# Patient Record
Sex: Male | Born: 1970
Health system: Southern US, Community
[De-identification: ages and names within clinical notes are randomized; demographics above are authoritative.]

## PROBLEM LIST (undated history)

## (undated) DIAGNOSIS — K859 Acute pancreatitis without necrosis or infection, unspecified: Secondary | ICD-10-CM

## (undated) HISTORY — PX: CHOLECYSTECTOMY: SHX55

---

## 2008-05-02 ENCOUNTER — Encounter: Admission: RE | Admit: 2008-05-02 | Discharge: 2008-05-02 | Payer: Self-pay | Admitting: Family Medicine

## 2008-05-02 IMAGING — US US SCROTUM
1 series · 14 of 25 positions shown · non-contrast
Comparison: None.

CLINICAL DATA: Bilateral scrotal mass.

ULTRASOUND OF SCROTUM
TECHNIQUE: Complete ultrasound examination of the testicles,
epididymis, and other scrotal structures was performed.

[Series 1: us scrotum · 0.08mm/px · 14 of 35 slices shown]
[im 1/35]
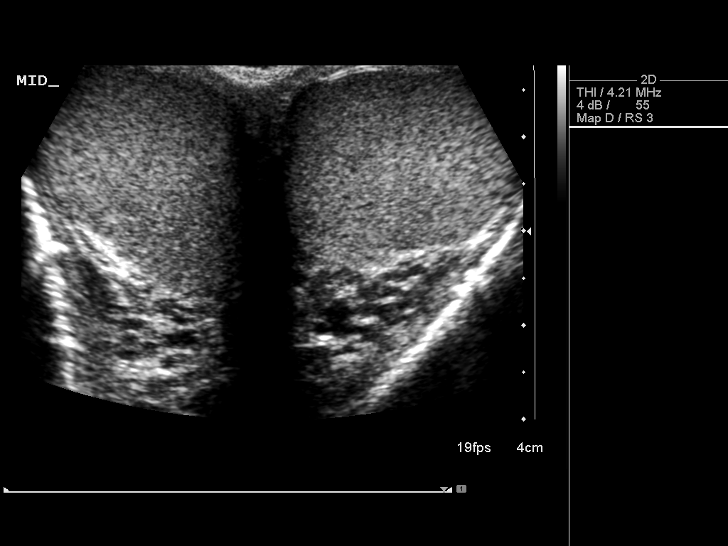
[im 3/35]
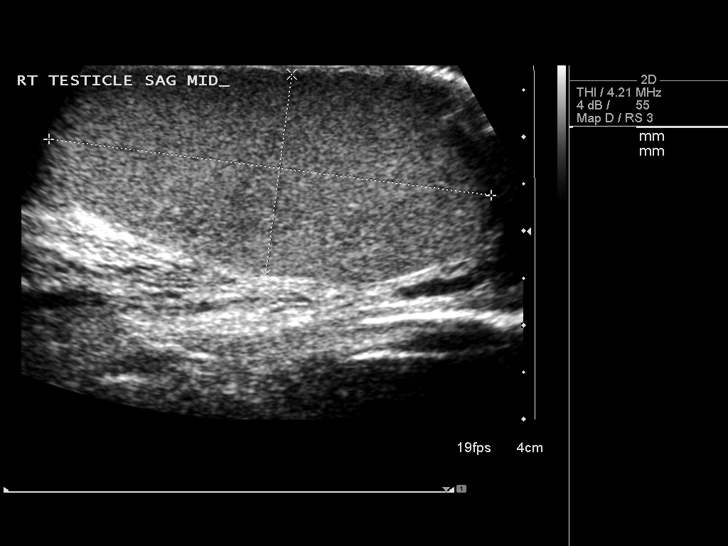
[im 6/35]
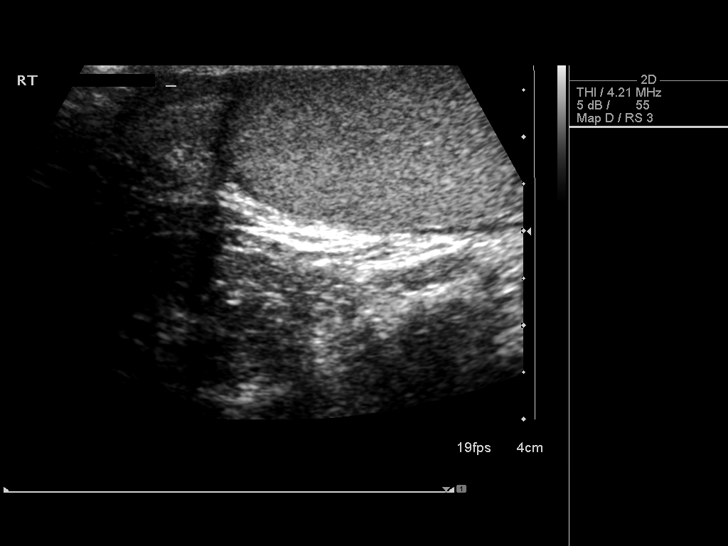
[im 9/35]
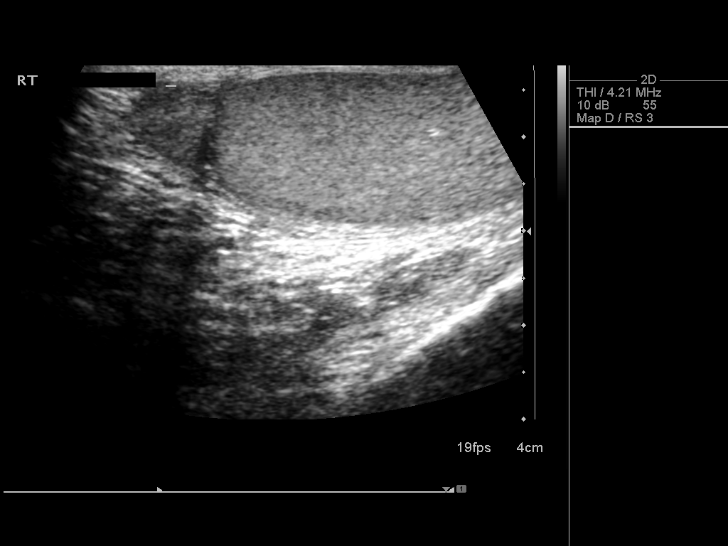
[im 12/35]
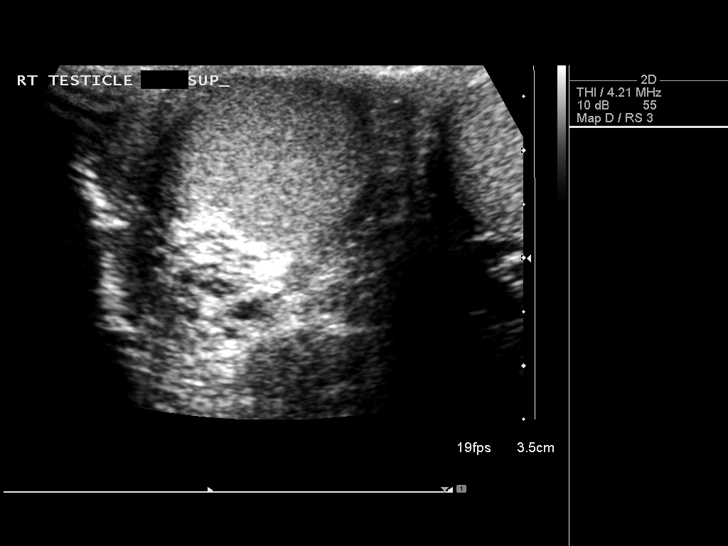
[im 13/35]
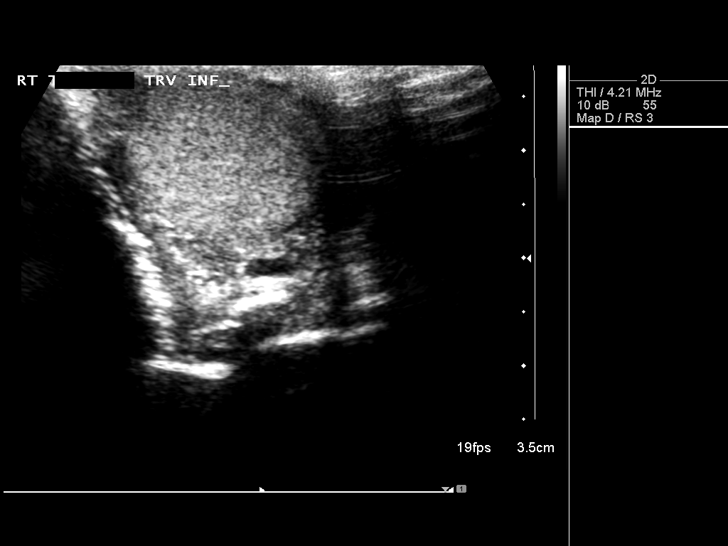
[im 16/35]
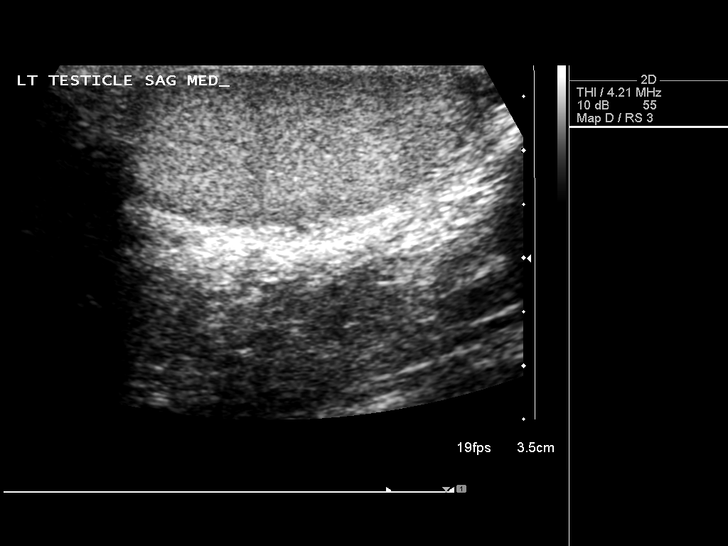
[im 19/35]
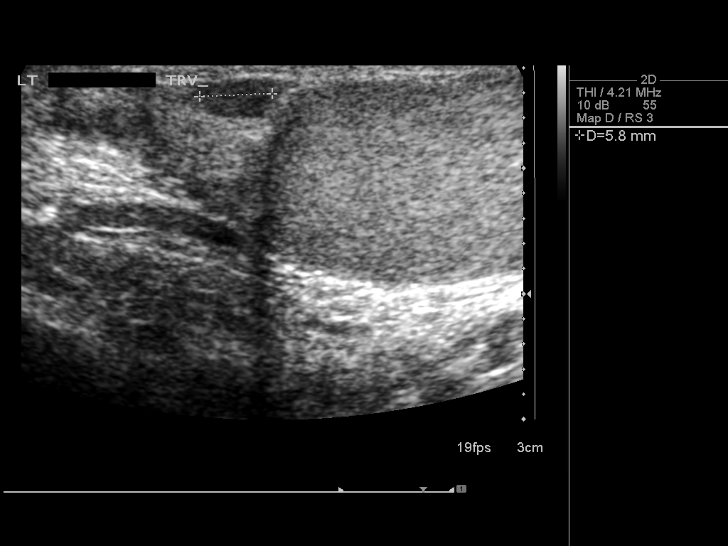
[im 22/35]
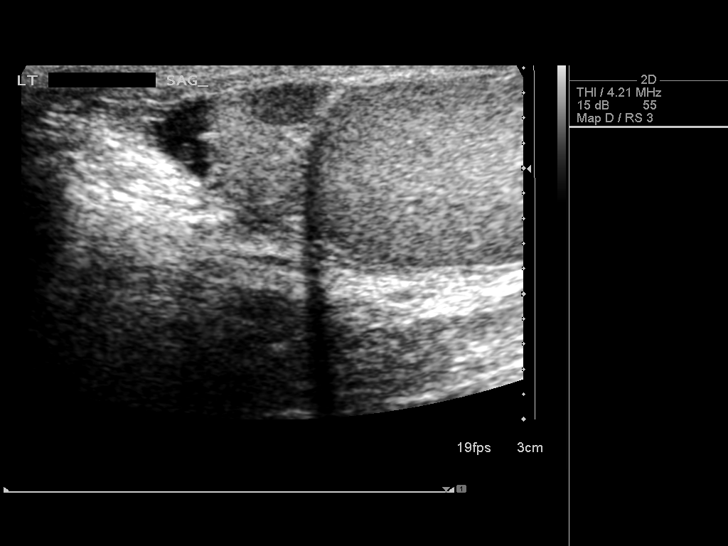
[im 23/35]
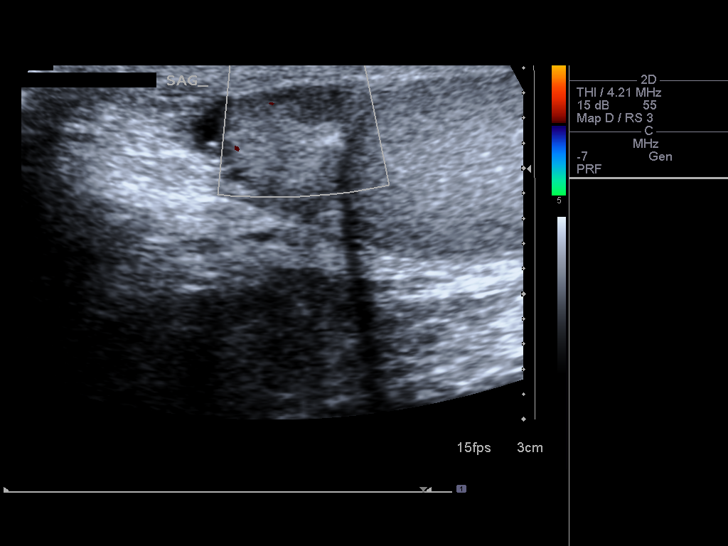
[im 26/35]
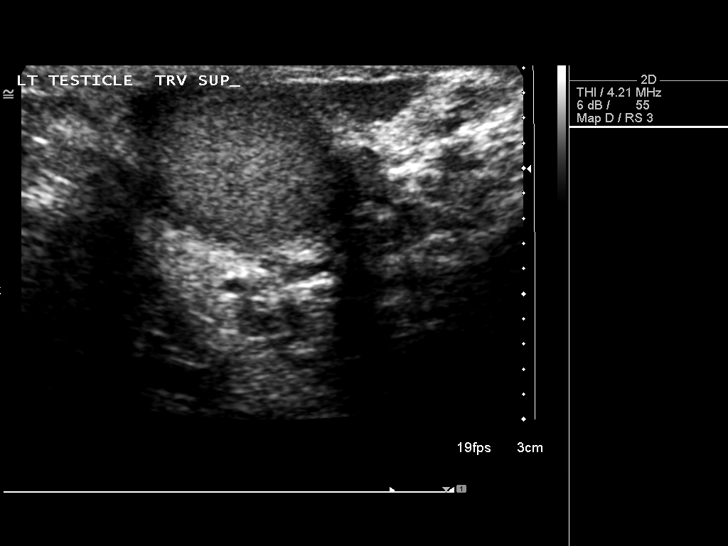
[im 29/35]
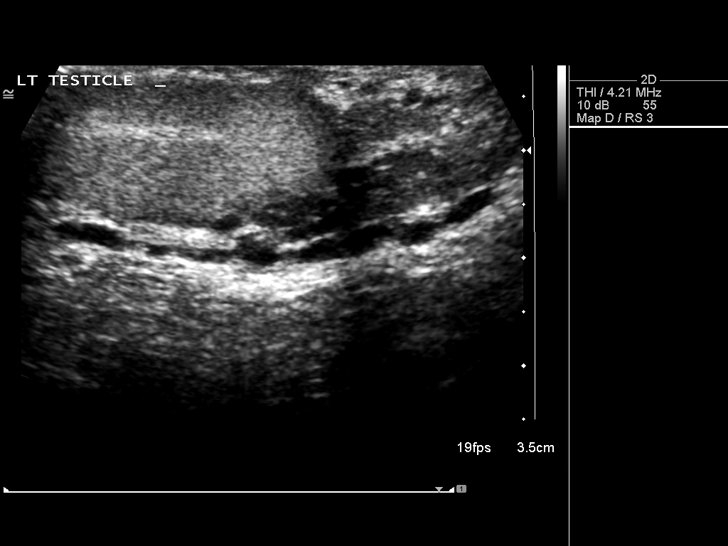
[im 32/35]
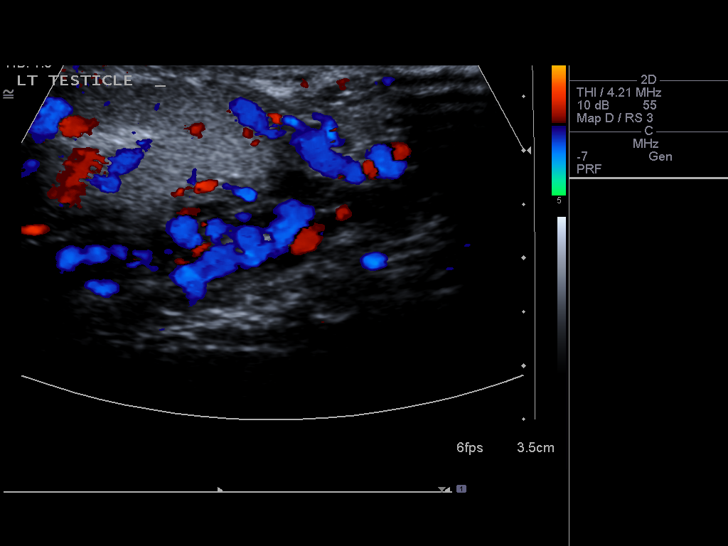
[im 35/35]
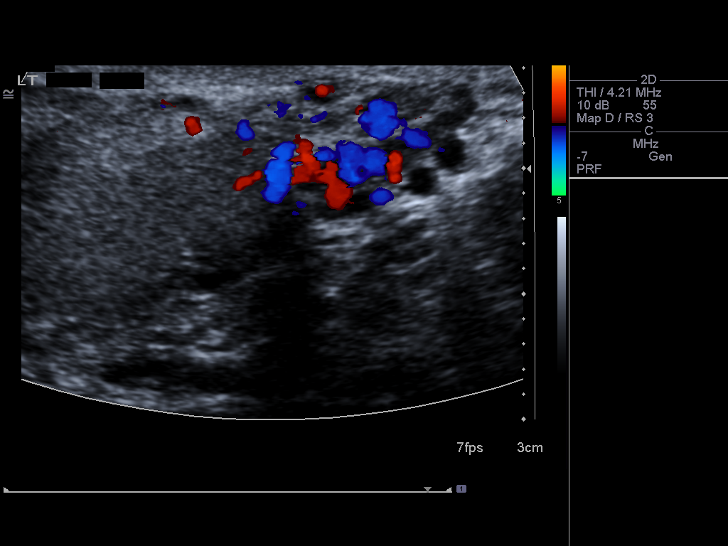

[14 of 25 positions shown; findings below may reference images not displayed]

FINDINGS: Right testicle measures 4.7 x 2.1 x 2.5 cm and left
testicle, 4.1 x 1.9 x 2.4 cm.  Color Doppler flow is seen
bilaterally and parenchymal echo texture is uniform.  Right
epididymis is unremarkable.  Hypoechoic nodule in the left
epididymis measures 5 mm and may be granulomatous in etiology.  No
hydrocele.  Left varicocele.
IMPRESSION: 1.  Left varicocele.

## 2010-01-15 ENCOUNTER — Inpatient Hospital Stay (HOSPITAL_COMMUNITY): Admission: EM | Admit: 2010-01-15 | Discharge: 2010-01-17 | Payer: Self-pay | Admitting: Emergency Medicine

## 2010-01-15 IMAGING — CR DG ABDOMEN ACUTE W/ 1V CHEST
3 series · 3 of 3 positions shown · non-contrast
Comparison: None.

CLINICAL DATA: Abdominal pain with nausea, abdominal pain with
nausea, vomiting, and diarrhea

ACUTE ABDOMEN SERIES (ABDOMEN 2 VIEW & CHEST 1 VIEW)

[view not recorded (1 of 3)]
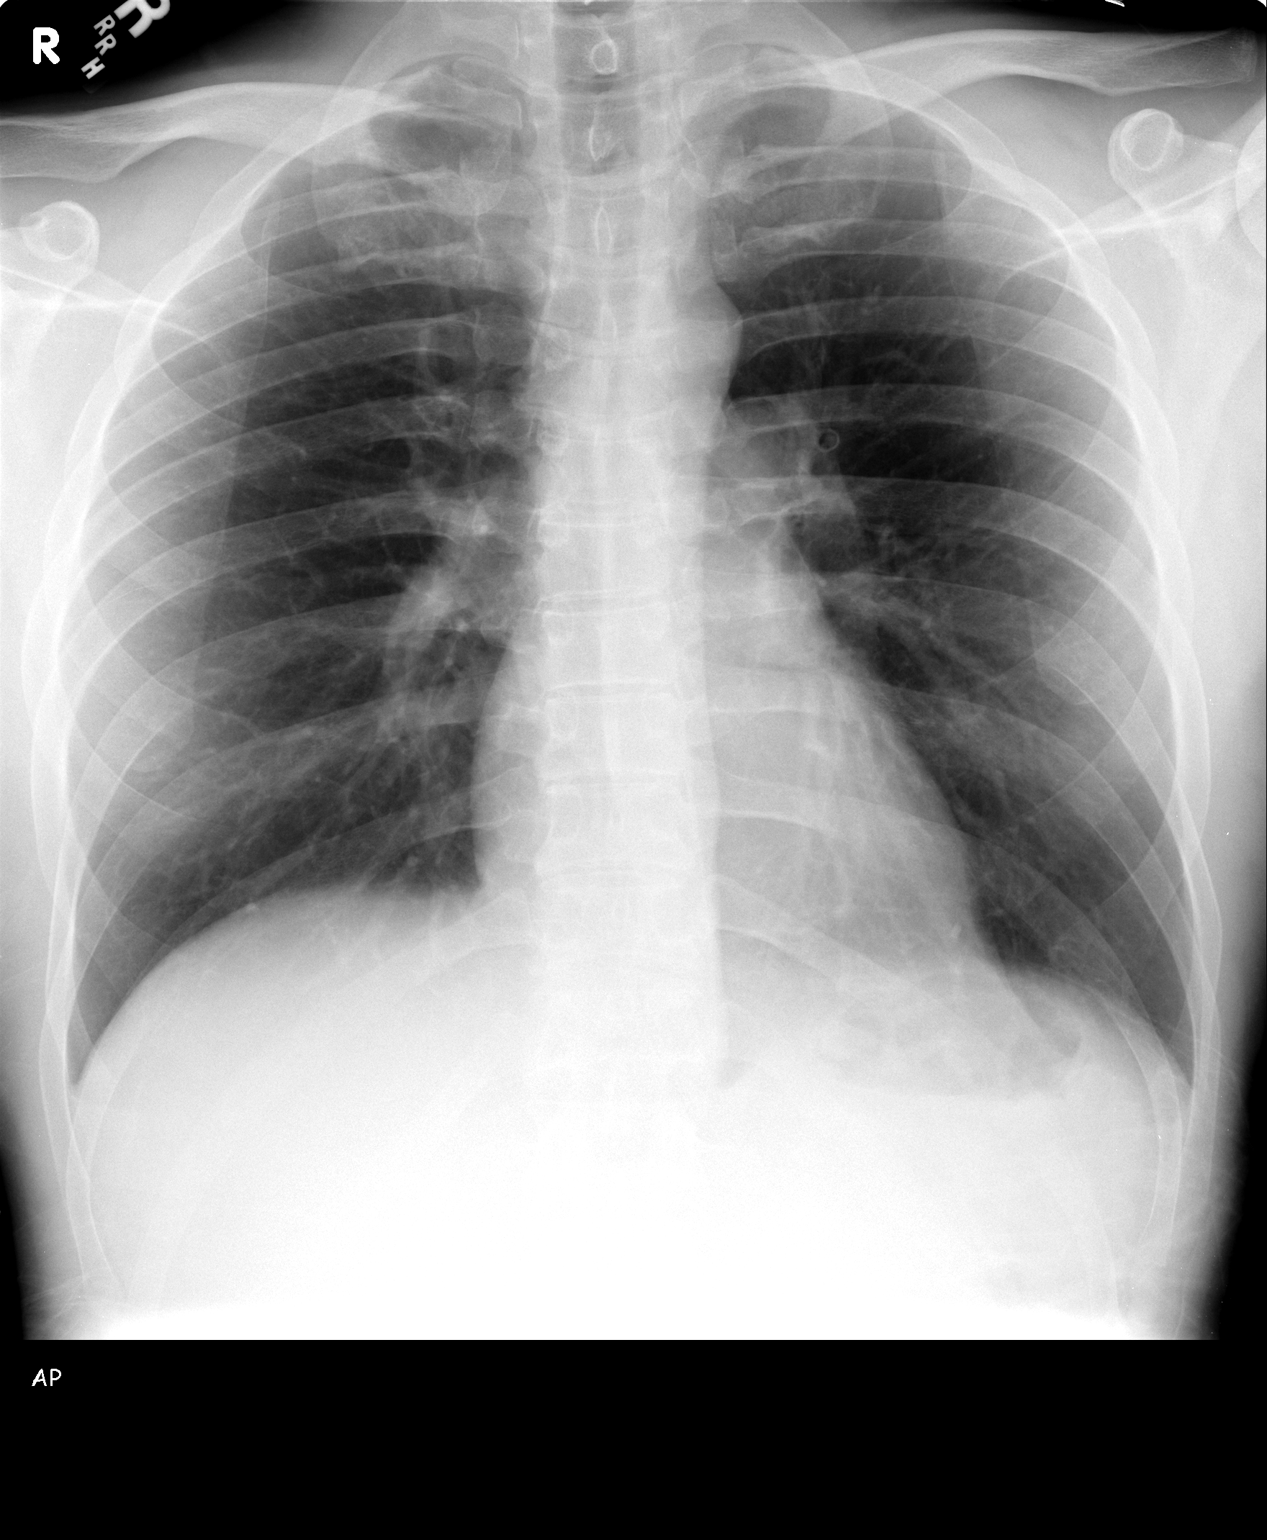

[view not recorded (2 of 3)]
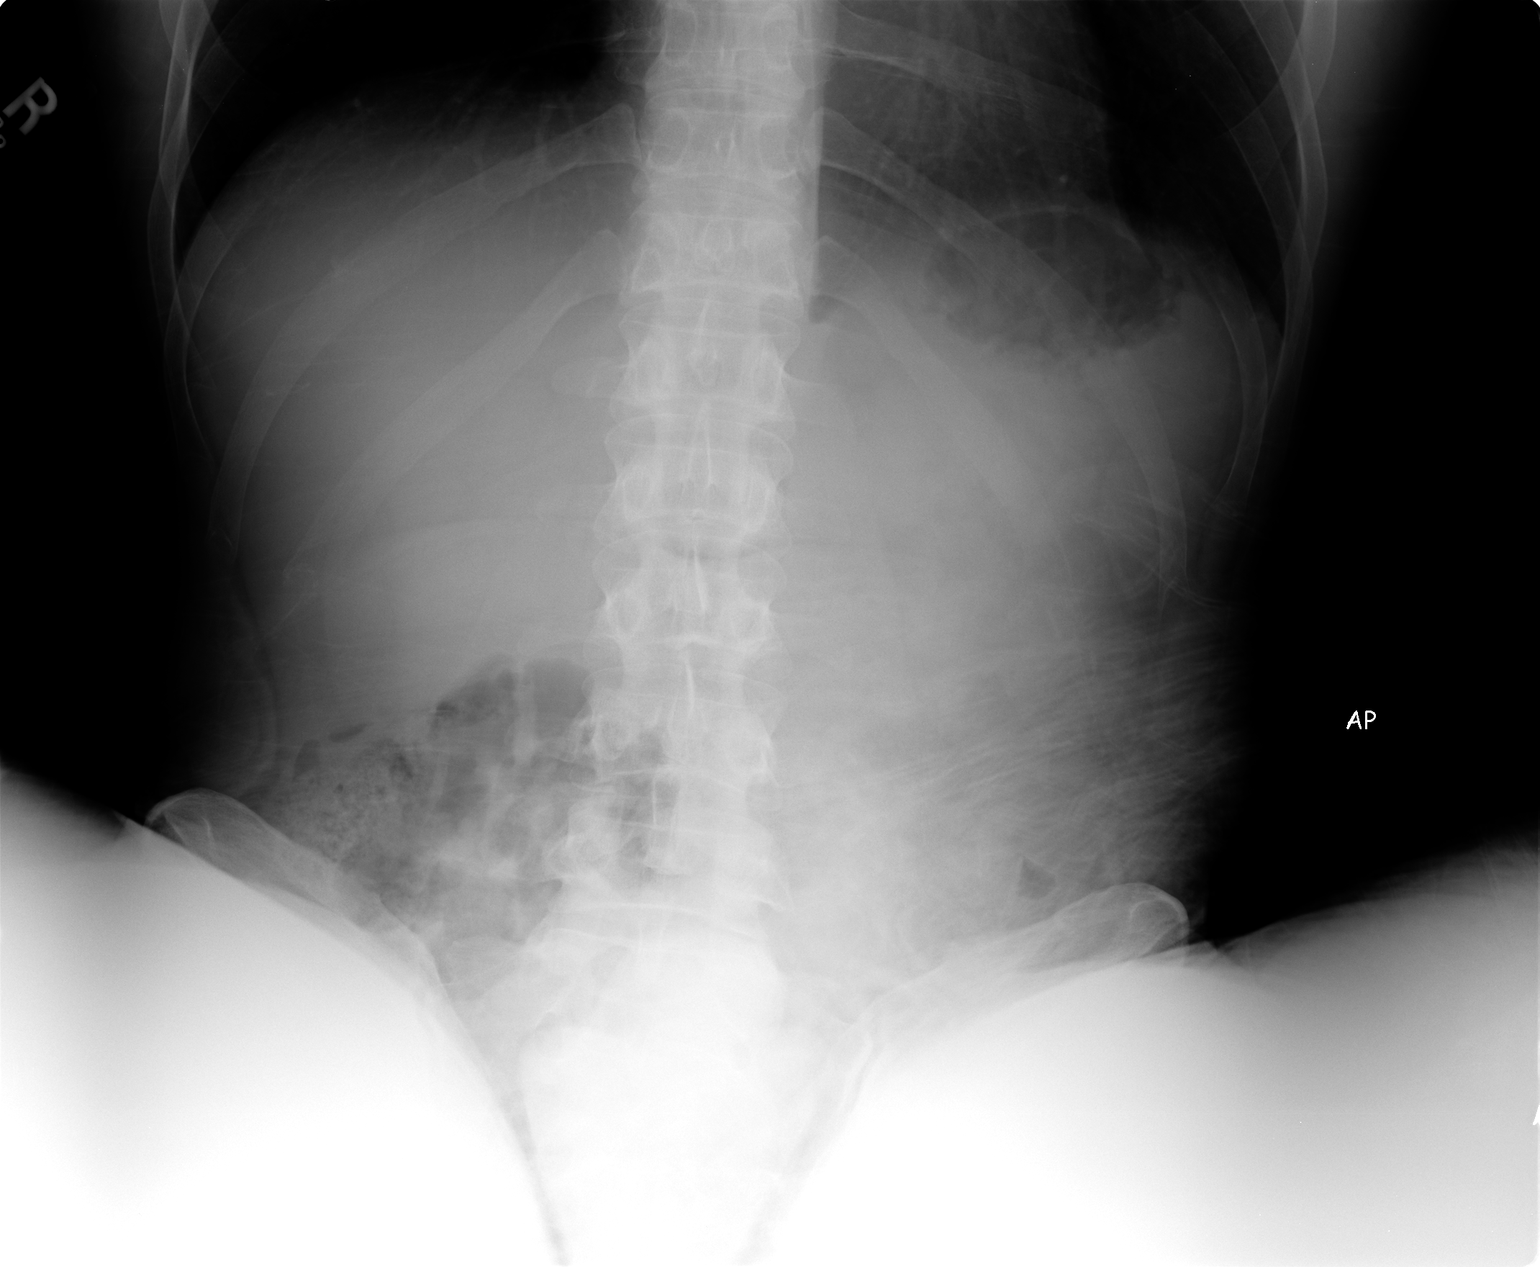

[view not recorded (3 of 3)]
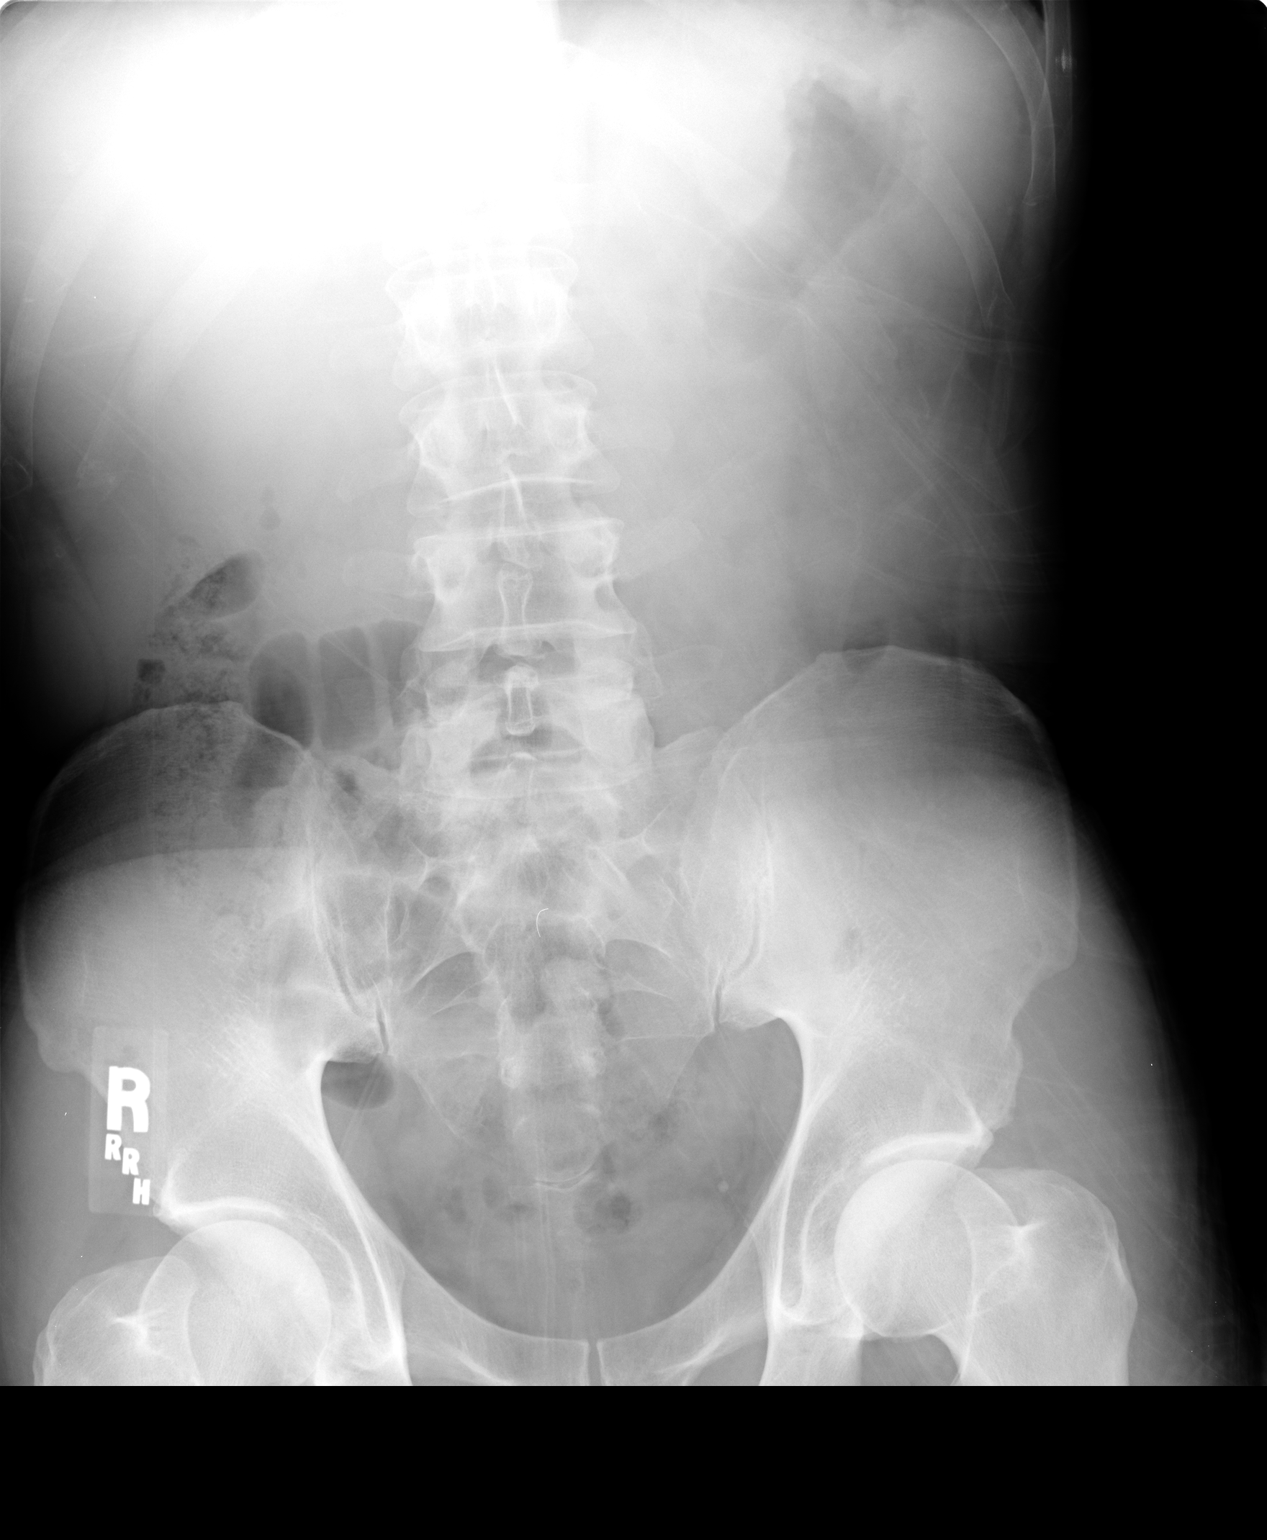

[3 of 3 positions shown; findings below may reference images not displayed]

FINDINGS: No active cardio-pulmonary disese. No free air or
acute/specific abnormality of the bowel gas pattern.  Psoas margins
intact.  No pathological calcifications.  Osseous structures
unremarkable.
IMPRESSION: No acute or specific findings.

## 2010-01-16 IMAGING — US US ABDOMEN COMPLETE
1 series · 14 of 25 positions shown · non-contrast
Comparison: None

CLINICAL DATA: Acute pancreatitis

ULTRASOUND ABDOMEN:
TECHNIQUE: Sonography of upper abdominal structures was performed.

[Series 1: us abdomen complete · 0.24mm/px · 14 of 62 slices shown]
[im 1/62]
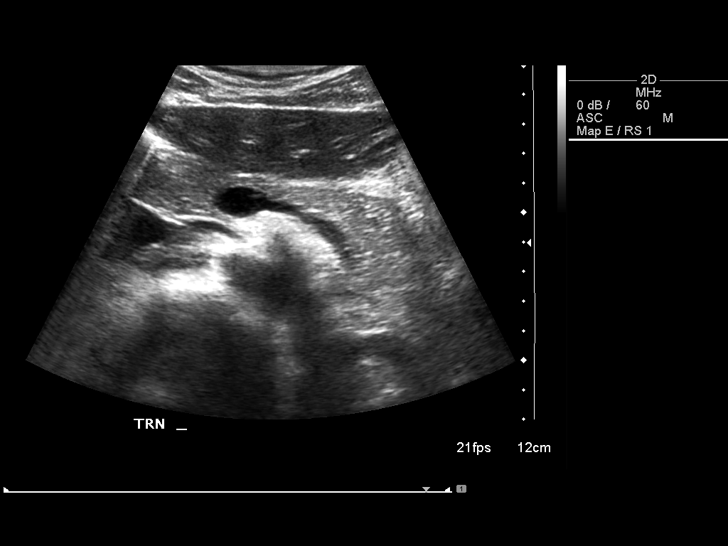
[im 6/62]
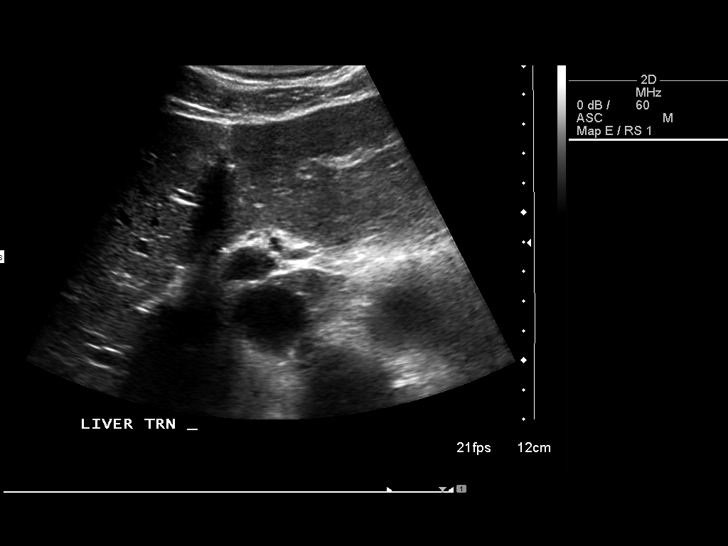
[im 11/62]
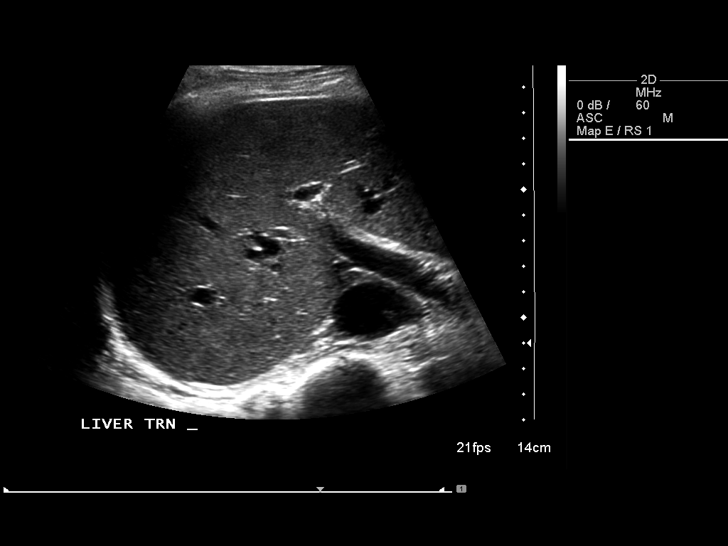
[im 16/62]
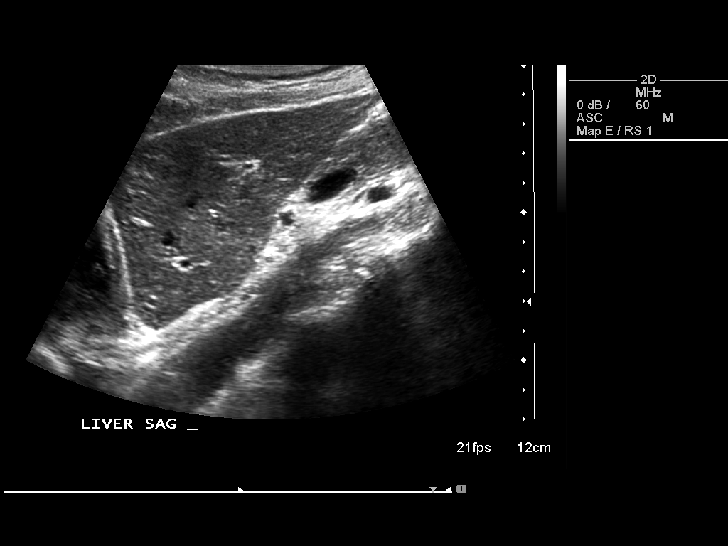
[im 21/62]
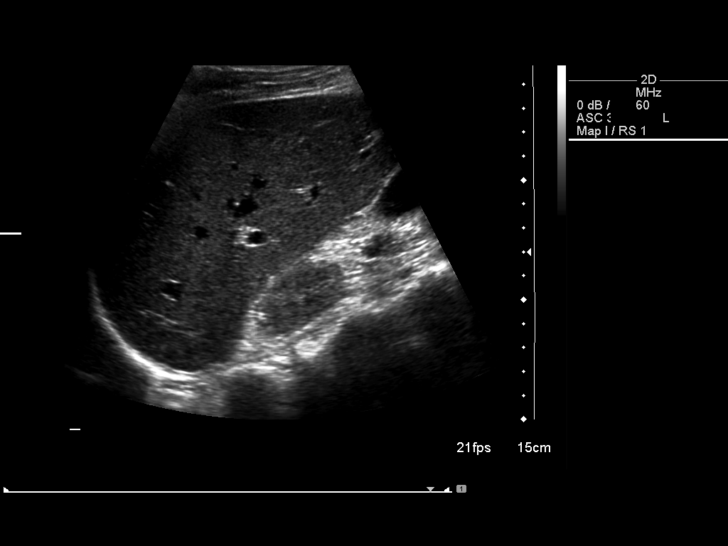
[im 23/62]
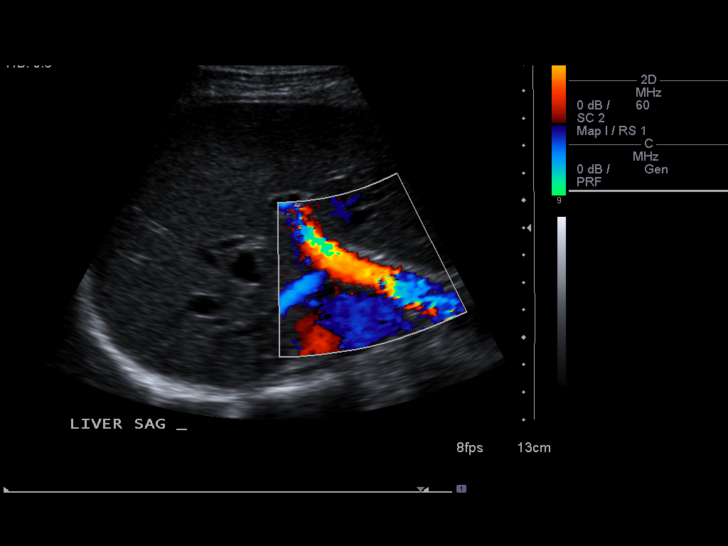
[im 28/62]
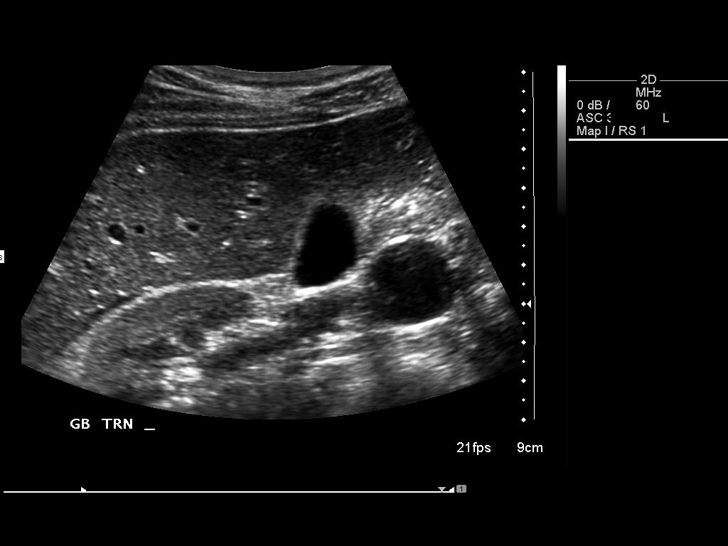
[im 34/62]
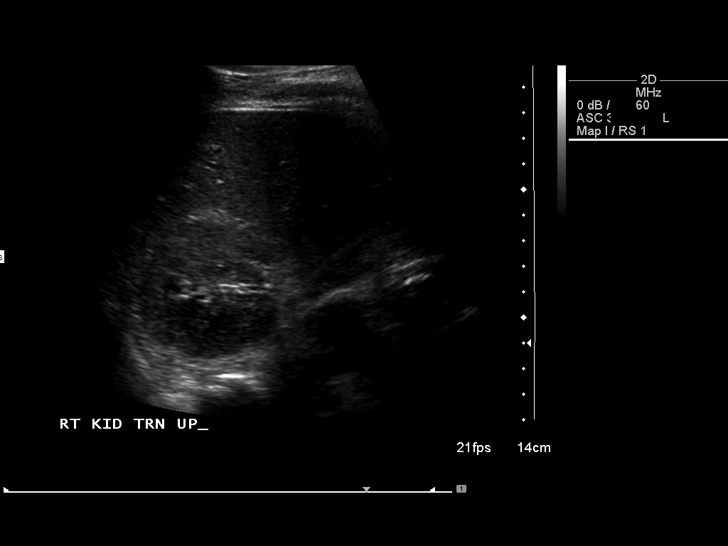
[im 39/62]
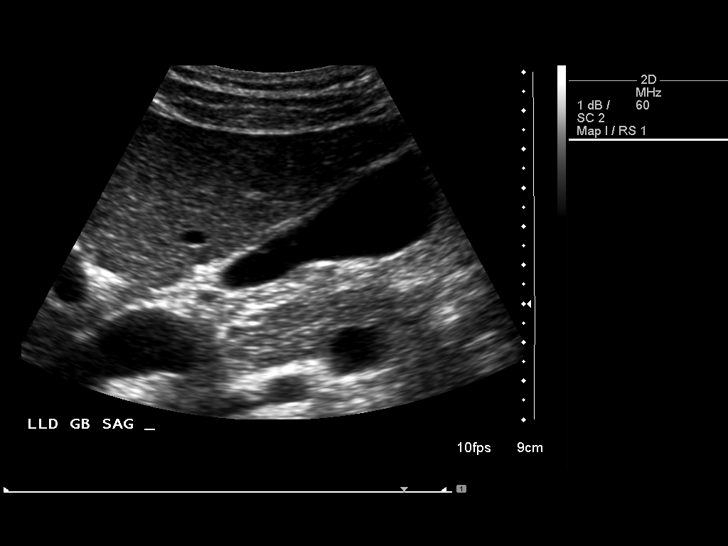
[im 41/62]
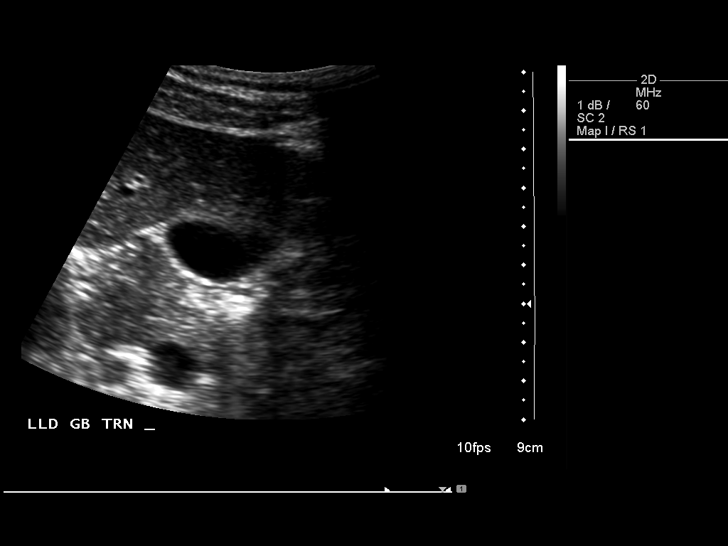
[im 46/62]
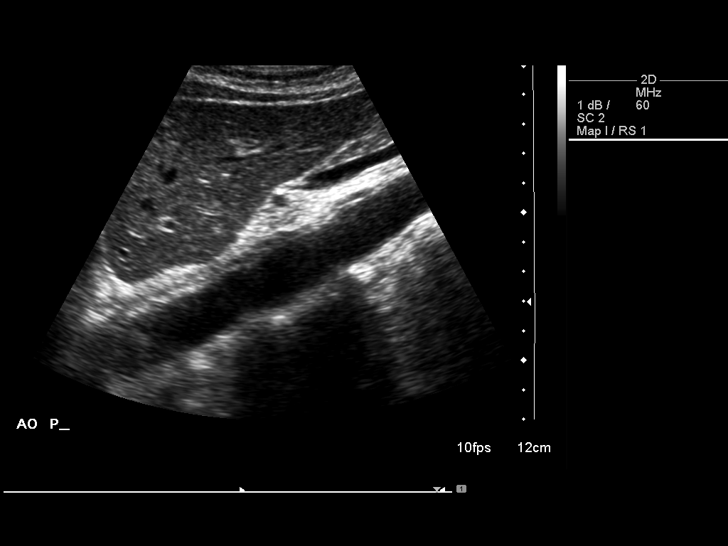
[im 51/62]
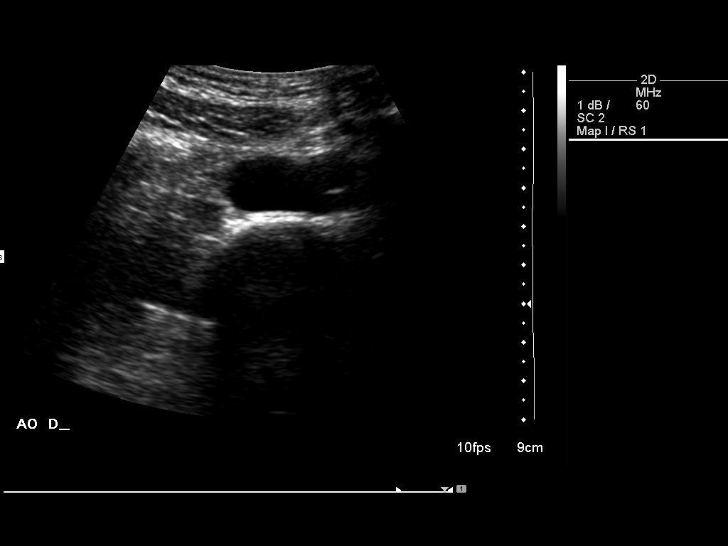
[im 56/62]
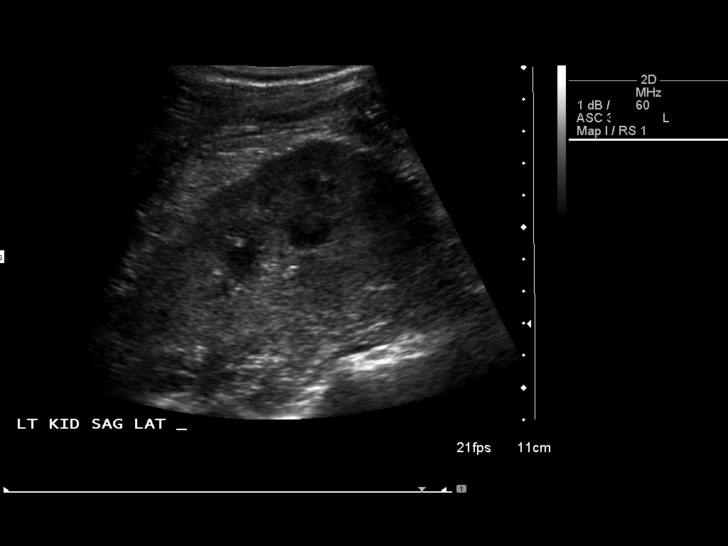
[im 62/62]
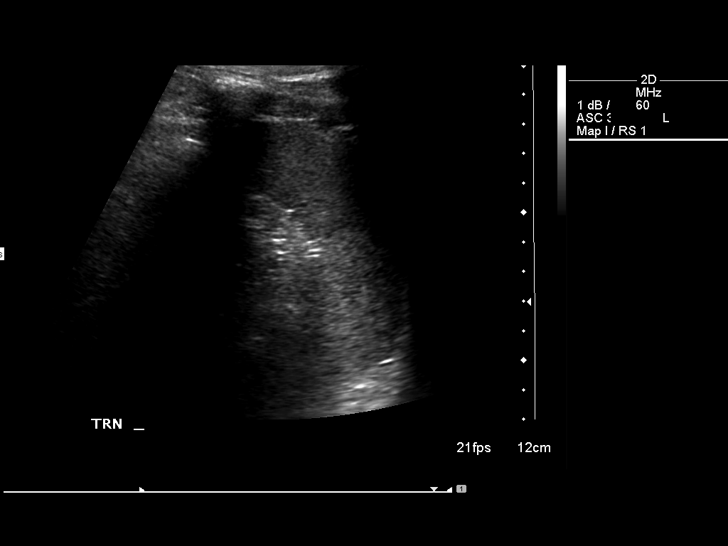

[14 of 25 positions shown; findings below may reference images not displayed]

Gallbladder:  Normally distended without stones or pericholecystic
fluid.
Gallbladder wall borderline thickened 3 mm thick.
No sonographic Murphy's sign.

Common bile duct:  3 mm diameter, normal.

Liver:  Normal appearance, patent portal vein with hepatopetal
flow.  No hepatic mass or nodularity.

IVC:  Unremarkable

Pancreas:  Sonographically normal appearance.  No ductal
dilatation, calcifications, or fluid collections.

Spleen:  Normal appearance, 10.0 cm length.

Right kidney:  11.7 cm length.  Normal morphology without mass or
hydronephrosis.

Left kidney:  11.7 cm length.  Normal morphology without mass or
hydronephrosis.

Aorta:  Unremarkable

Other:  All no free fluid
IMPRESSION: Upper normal gallbladder wall thickness 3 mm diameter without
evidence of calculi or tenderness.
Otherwise negative upper abdominal ultrasound.

## 2010-01-17 IMAGING — CR DG CHEST 2V
2 series · 2 of 2 positions shown · non-contrast
Comparison: [DATE]

CLINICAL DATA: Cough, smoker, question pneumonia, acute
pancreatitis

CHEST - 2 VIEW

[view not recorded (1 of 2)]
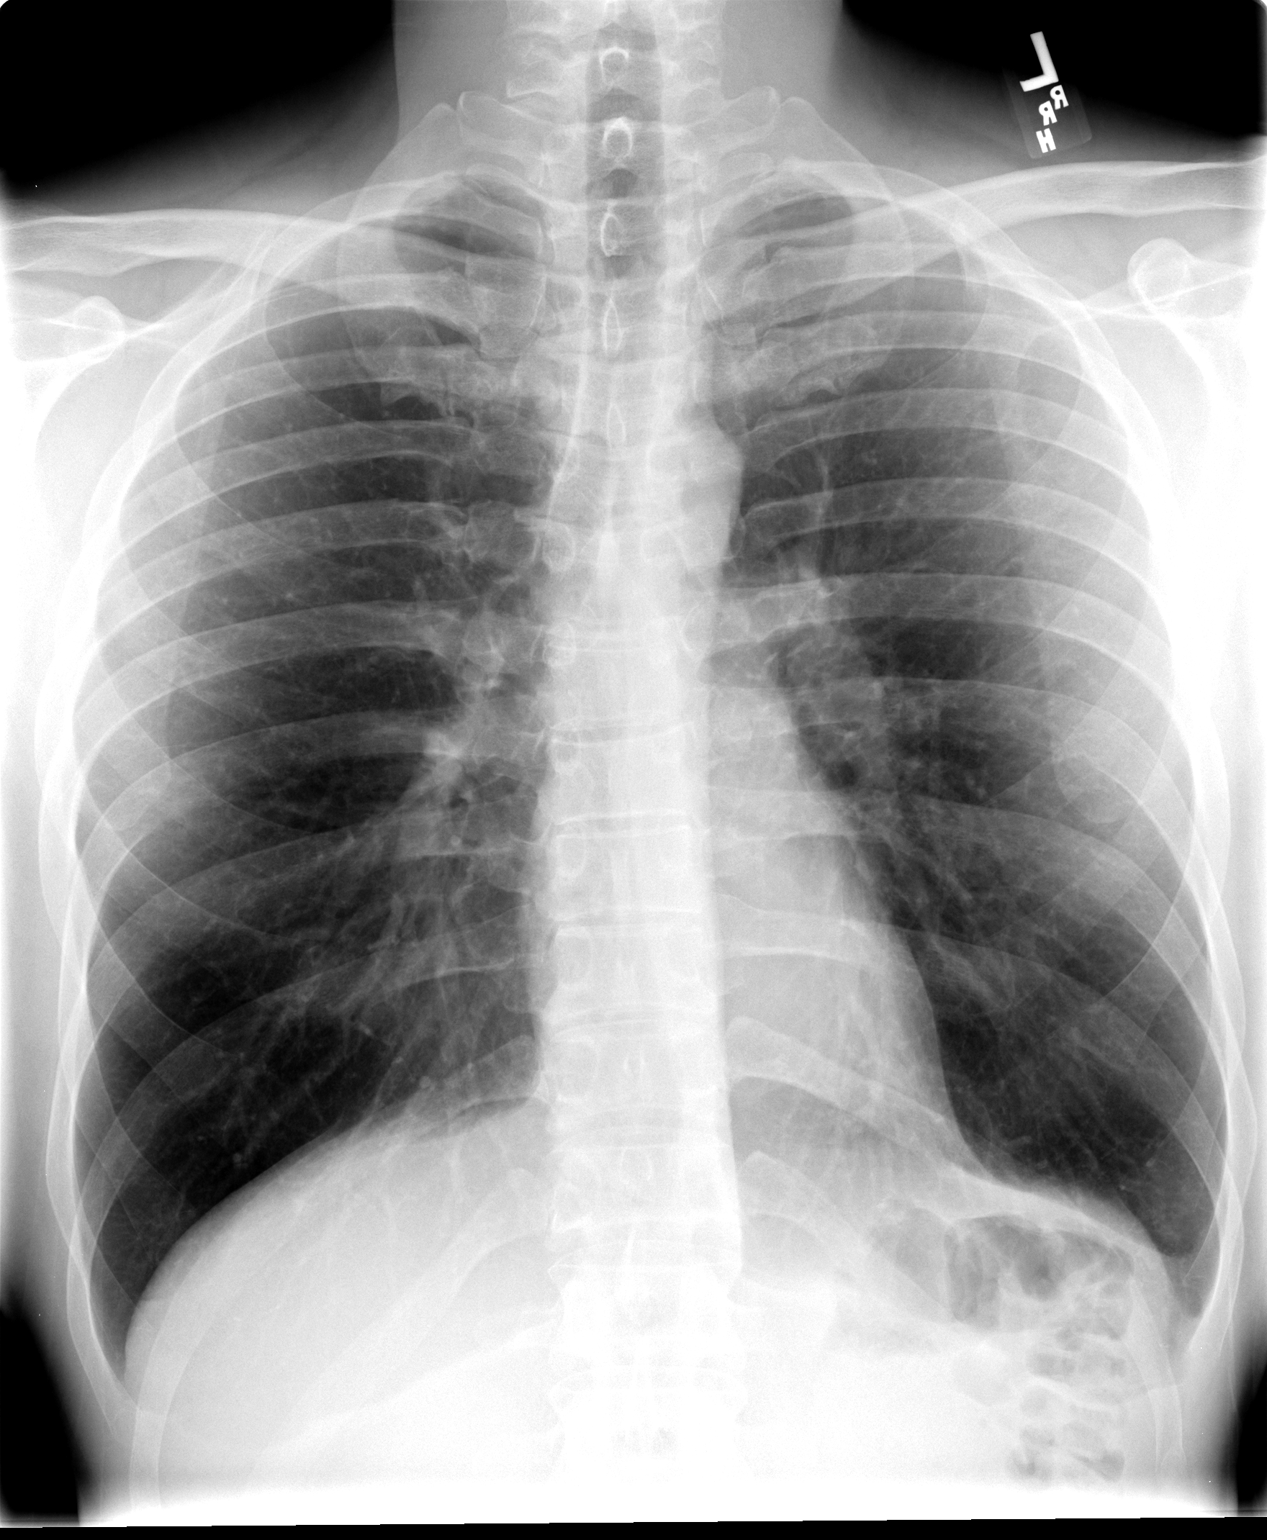

[view not recorded (2 of 2)]
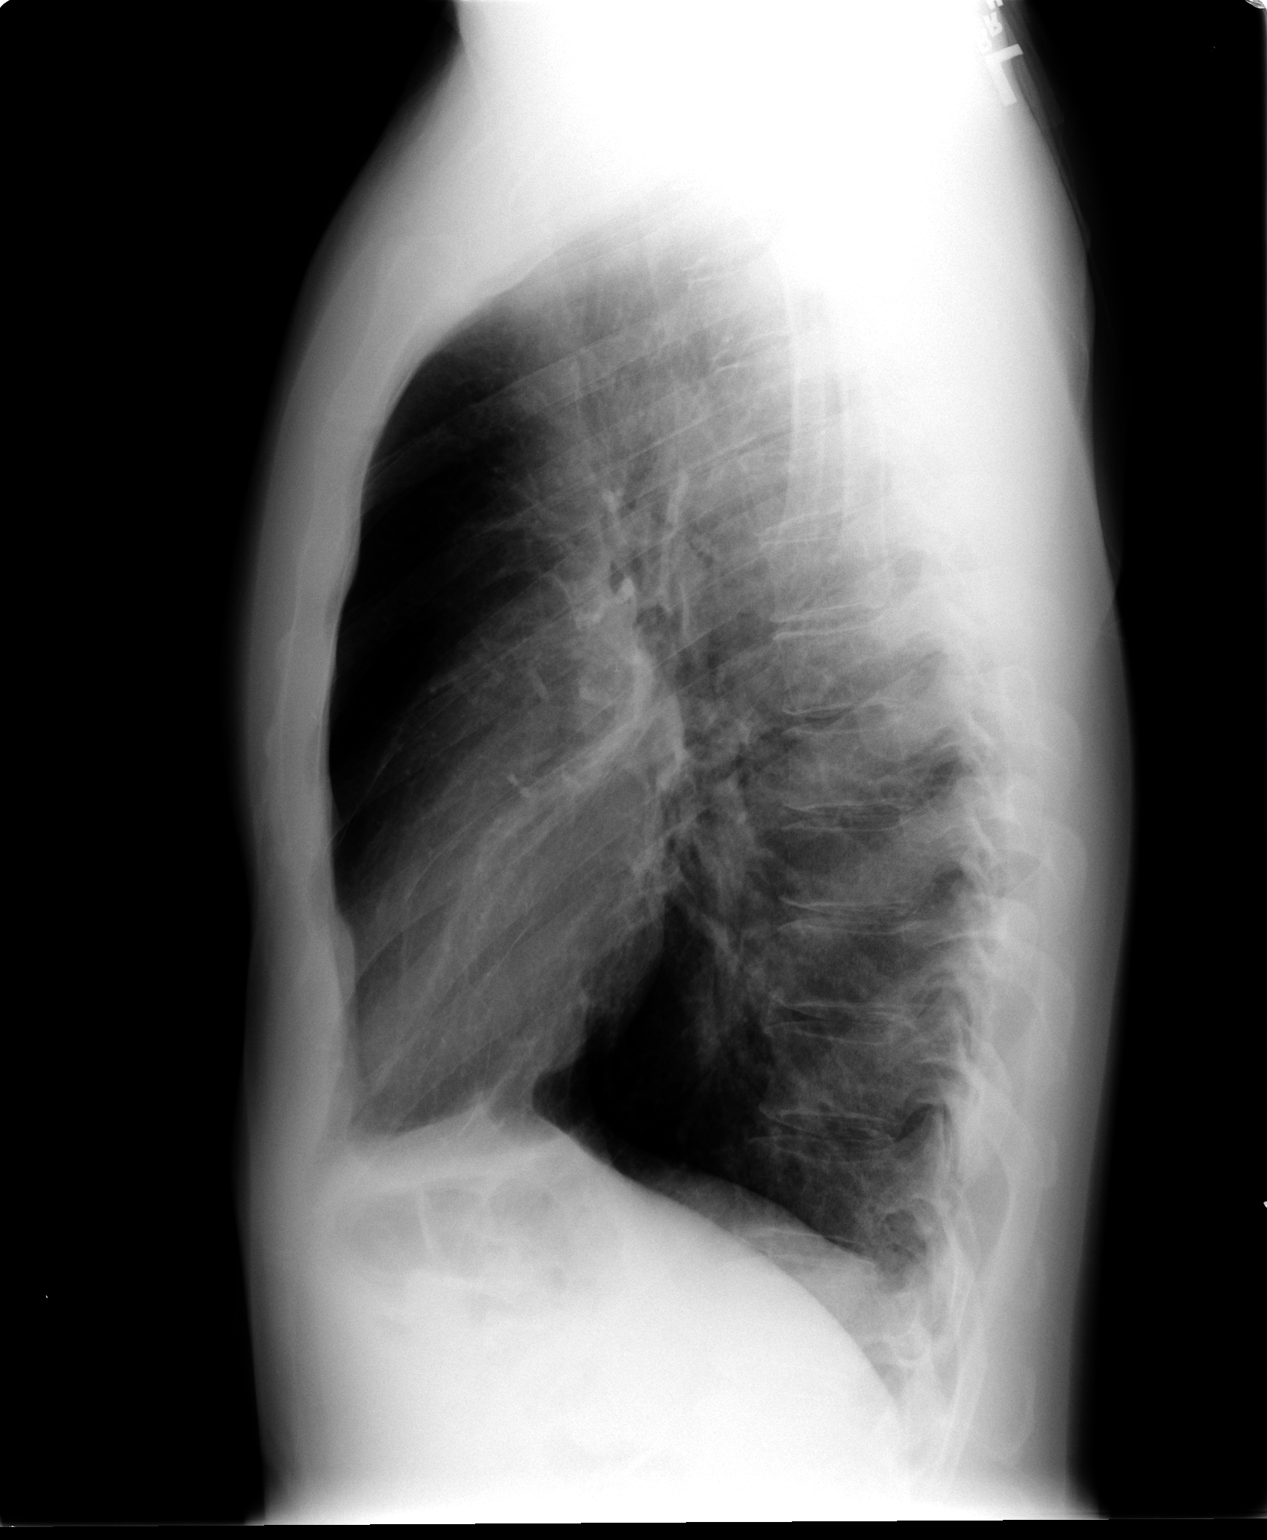

[2 of 2 positions shown; findings below may reference images not displayed]

FINDINGS: Normal heart size, mediastinal contours, and pulmonary vascularity.
Emphysematous and minimal bronchitic changes compatible with COPD.
No definite pulmonary infiltrate, pleural effusion, or
pneumothorax.
No acute bony findings.
IMPRESSION: COPD.
No acute abnormalities.

## 2011-02-04 LAB — BASIC METABOLIC PANEL
BUN: 10 mg/dL (ref 6–23)
BUN: 11 mg/dL (ref 6–23)
CO2: 23 mEq/L (ref 19–32)
CO2: 26 mEq/L (ref 19–32)
Calcium: 9.1 mg/dL (ref 8.4–10.5)
Calcium: 9.4 mg/dL (ref 8.4–10.5)
Chloride: 105 mEq/L (ref 96–112)
Chloride: 107 mEq/L (ref 96–112)
Creatinine, Ser: 0.78 mg/dL (ref 0.4–1.5)
Creatinine, Ser: 0.88 mg/dL (ref 0.4–1.5)
GFR calc Af Amer: >60 mL/min (ref >60)
GFR calc Af Amer: >60 mL/min (ref >60)
GFR calc non Af Amer: >60 mL/min (ref >60)
GFR calc non Af Amer: >60 mL/min (ref >60)
Glucose, Bld: 118 mg/dL — ABNORMAL HIGH (ref 70–99)
Glucose, Bld: 97 mg/dL (ref 70–99)
Potassium: 3.4 mEq/L — ABNORMAL LOW (ref 3.5–5.1)
Potassium: 4 mEq/L (ref 3.5–5.1)
Sodium: 137 mEq/L (ref 135–145)
Sodium: 138 mEq/L (ref 135–145)

## 2011-02-04 LAB — LIPASE, BLOOD
Lipase: 115 U/L — ABNORMAL HIGH (ref 11–59)
Lipase: 48 U/L (ref 11–59)

## 2011-02-04 LAB — DIFFERENTIAL
Basophils Absolute: 0.1 10*3/uL (ref 0.0–0.1)
Basophils Absolute: 0.1 10*3/uL (ref 0.0–0.1)
Basophils Relative: 1 % (ref 0–1)
Basophils Relative: 1 % (ref 0–1)
Eosinophils Absolute: 0 10*3/uL (ref 0.0–0.7)
Eosinophils Absolute: 0 10*3/uL (ref 0.0–0.7)
Eosinophils Relative: 0 % (ref 0–5)
Eosinophils Relative: 0 % (ref 0–5)
Lymphocytes Relative: 29 % (ref 12–46)
Lymphocytes Relative: 8 % — ABNORMAL LOW (ref 12–46)
Lymphs Abs: 0.9 10*3/uL (ref 0.7–4.0)
Lymphs Abs: 1.8 10*3/uL (ref 0.7–4.0)
Monocytes Absolute: 0.5 10*3/uL (ref 0.1–1.0)
Monocytes Absolute: 0.6 10*3/uL (ref 0.1–1.0)
Monocytes Relative: 4 % (ref 3–12)
Monocytes Relative: 9 % (ref 3–12)
Neutro Abs: 3.7 10*3/uL (ref 1.7–7.7)
Neutro Abs: 9.6 10*3/uL — ABNORMAL HIGH (ref 1.7–7.7)
Neutrophils Relative %: 61 % (ref 43–77)
Neutrophils Relative %: 86 % — ABNORMAL HIGH (ref 43–77)

## 2011-02-04 LAB — LIPID PANEL
Cholesterol: 99 mg/dL (ref 0–200)
HDL: 39 mg/dL — ABNORMAL LOW (ref >39)
LDL Cholesterol: 47 mg/dL (ref 0–99)
Total CHOL/HDL Ratio: 2.5 RATIO
Triglycerides: 67 mg/dL (ref <150)
VLDL: 13 mg/dL (ref 0–40)

## 2011-02-04 LAB — CBC
HCT: 41 % (ref 39.0–52.0)
HCT: 44.1 % (ref 39.0–52.0)
Hemoglobin: 14.5 g/dL (ref 13.0–17.0)
Hemoglobin: 15.2 g/dL (ref 13.0–17.0)
MCHC: 34.5 g/dL (ref 30.0–36.0)
MCHC: 35.4 g/dL (ref 30.0–36.0)
MCV: 91.6 fL (ref 78.0–100.0)
MCV: 94.1 fL (ref 78.0–100.0)
Platelets: 162 10*3/uL (ref 150–400)
Platelets: 166 10*3/uL (ref 150–400)
RBC: 4.48 MIL/uL (ref 4.22–5.81)
RBC: 4.69 MIL/uL (ref 4.22–5.81)
RDW: 12.4 % (ref 11.5–15.5)
RDW: 12.9 % (ref 11.5–15.5)
WBC: 11.1 10*3/uL — ABNORMAL HIGH (ref 4.0–10.5)
WBC: 6.1 10*3/uL (ref 4.0–10.5)

## 2011-02-04 LAB — COMPREHENSIVE METABOLIC PANEL
ALT: 20 U/L (ref 0–53)
AST: 34 U/L (ref 0–37)
Albumin: 3.2 g/dL — ABNORMAL LOW (ref 3.5–5.2)
Alkaline Phosphatase: 36 U/L — ABNORMAL LOW (ref 39–117)
BUN: 7 mg/dL (ref 6–23)
CO2: 26 mEq/L (ref 19–32)
Calcium: 8.3 mg/dL — ABNORMAL LOW (ref 8.4–10.5)
Chloride: 110 mEq/L (ref 96–112)
Creatinine, Ser: 0.89 mg/dL (ref 0.4–1.5)
GFR calc Af Amer: >60 mL/min (ref >60)
GFR calc non Af Amer: >60 mL/min (ref >60)
Glucose, Bld: 88 mg/dL (ref 70–99)
Potassium: 4.2 mEq/L (ref 3.5–5.1)
Sodium: 140 mEq/L (ref 135–145)
Total Bilirubin: 1.9 mg/dL — ABNORMAL HIGH (ref 0.3–1.2)
Total Protein: 5.3 g/dL — ABNORMAL LOW (ref 6.0–8.3)

## 2011-02-04 LAB — HEPATITIS PANEL, ACUTE
HCV Ab: NEGATIVE
Hep A IgM: NEGATIVE
Hep B C IgM: NEGATIVE
Hepatitis B Surface Ag: NEGATIVE

## 2011-02-04 LAB — RAPID URINE DRUG SCREEN, HOSP PERFORMED
Amphetamines: NOT DETECTED
Barbiturates: NOT DETECTED
Benzodiazepines: NOT DETECTED
Cocaine: NOT DETECTED
Opiates: NOT DETECTED
Tetrahydrocannabinol: POSITIVE — AB

## 2011-02-04 LAB — HIV ANTIBODY (ROUTINE TESTING W REFLEX): HIV: NONREACTIVE

## 2011-02-04 LAB — HEPATIC FUNCTION PANEL
ALT: 21 U/L (ref 0–53)
AST: 34 U/L (ref 0–37)
Albumin: 3.2 g/dL — ABNORMAL LOW (ref 3.5–5.2)
Alkaline Phosphatase: 38 U/L — ABNORMAL LOW (ref 39–117)
Bilirubin, Direct: 0.3 mg/dL (ref 0.0–0.3)
Indirect Bilirubin: 1.5 mg/dL — ABNORMAL HIGH (ref 0.3–0.9)
Total Bilirubin: 1.8 mg/dL — ABNORMAL HIGH (ref 0.3–1.2)
Total Protein: 5.3 g/dL — ABNORMAL LOW (ref 6.0–8.3)

## 2011-02-04 LAB — URINALYSIS, ROUTINE W REFLEX MICROSCOPIC
Bilirubin Urine: NEGATIVE
Glucose, UA: NEGATIVE mg/dL
Hgb urine dipstick: NEGATIVE
Ketones, ur: NEGATIVE mg/dL
Nitrite: NEGATIVE
Protein, ur: NEGATIVE mg/dL
Specific Gravity, Urine: 1.015 (ref 1.005–1.030)
Urobilinogen, UA: 0.2 mg/dL (ref 0.0–1.0)
pH: 8.5 — ABNORMAL HIGH (ref 5.0–8.0)

## 2011-02-04 LAB — MAGNESIUM: Magnesium: 1.6 mg/dL (ref 1.5–2.5)

## 2011-02-04 LAB — TSH: TSH: 1.809 u[IU]/mL (ref 0.350–4.500)

## 2011-07-30 ENCOUNTER — Emergency Department (HOSPITAL_COMMUNITY)
Admission: EM | Admit: 2011-07-30 | Discharge: 2011-07-30 | Disposition: A | Discharge status: Home / Self Care | Payer: 59 | Attending: Emergency Medicine | Admitting: Emergency Medicine

## 2011-07-30 ENCOUNTER — Encounter: Payer: Self-pay | Admitting: Emergency Medicine

## 2011-07-30 ENCOUNTER — Emergency Department (HOSPITAL_COMMUNITY): Payer: 59

## 2011-07-30 DIAGNOSIS — F17200 Nicotine dependence, unspecified, uncomplicated: Secondary | ICD-10-CM | POA: Insufficient documentation

## 2011-07-30 DIAGNOSIS — S62309A Unspecified fracture of unspecified metacarpal bone, initial encounter for closed fracture: Secondary | ICD-10-CM | POA: Insufficient documentation

## 2011-07-30 DIAGNOSIS — Y92009 Unspecified place in unspecified non-institutional (private) residence as the place of occurrence of the external cause: Secondary | ICD-10-CM | POA: Insufficient documentation

## 2011-07-30 DIAGNOSIS — IMO0002 Reserved for concepts with insufficient information to code with codable children: Secondary | ICD-10-CM | POA: Insufficient documentation

## 2011-07-30 IMAGING — CR DG HAND COMPLETE 3+V*R*
3 series · 3 of 3 positions shown · non-contrast
Comparison: None.

CLINICAL DATA: Right hand pain/swelling, fourth metacarpal

RIGHT HAND - COMPLETE 3+ VIEW

[view not recorded (1 of 3)]
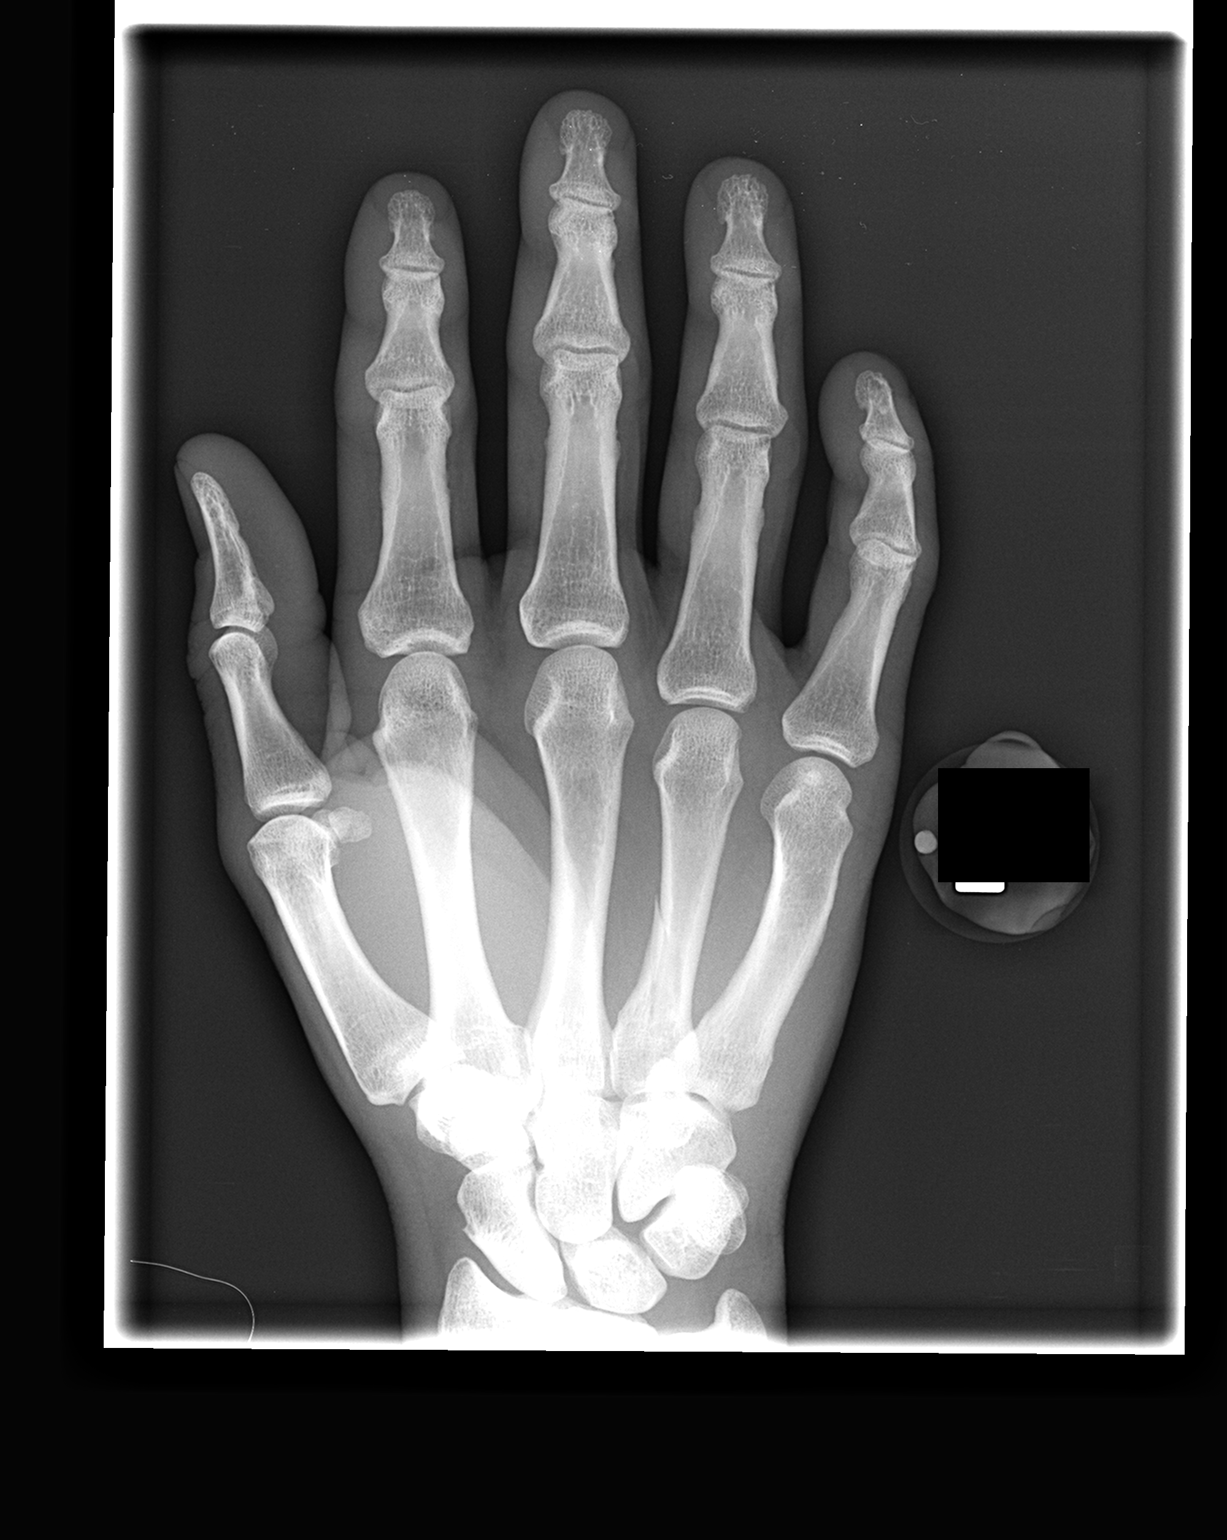

[view not recorded (2 of 3)]
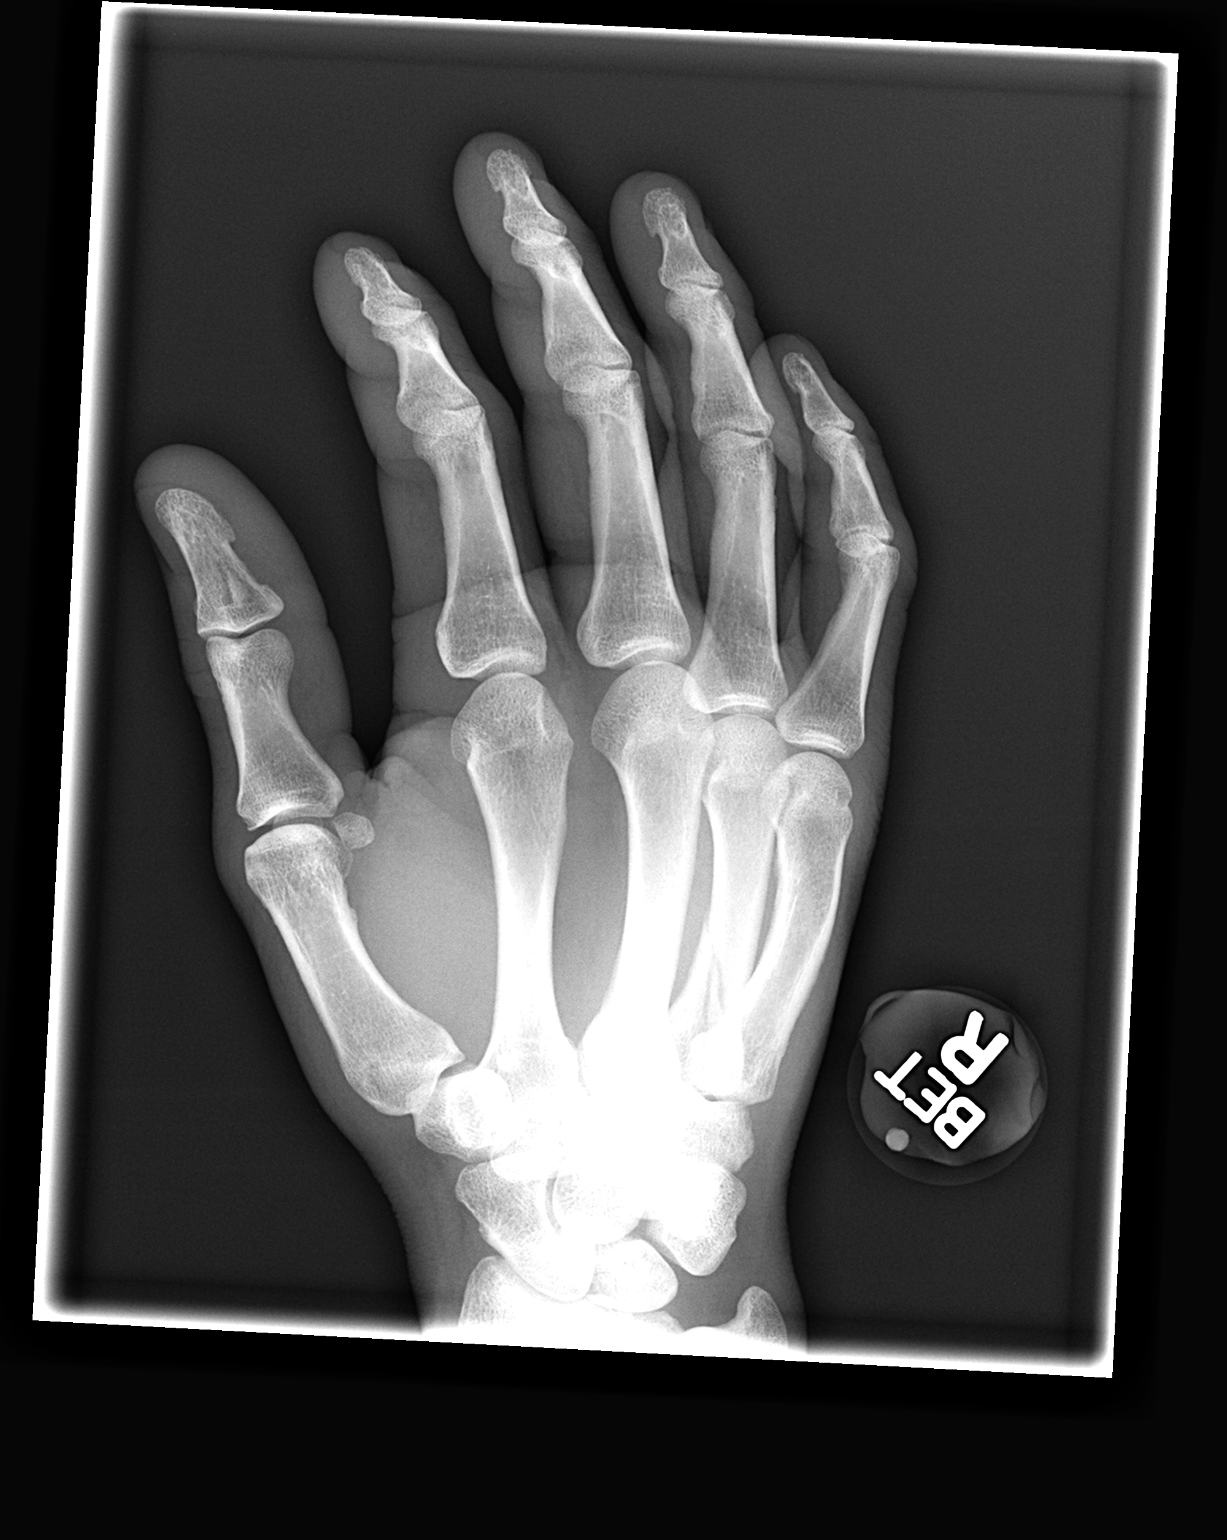

[view not recorded (3 of 3)]
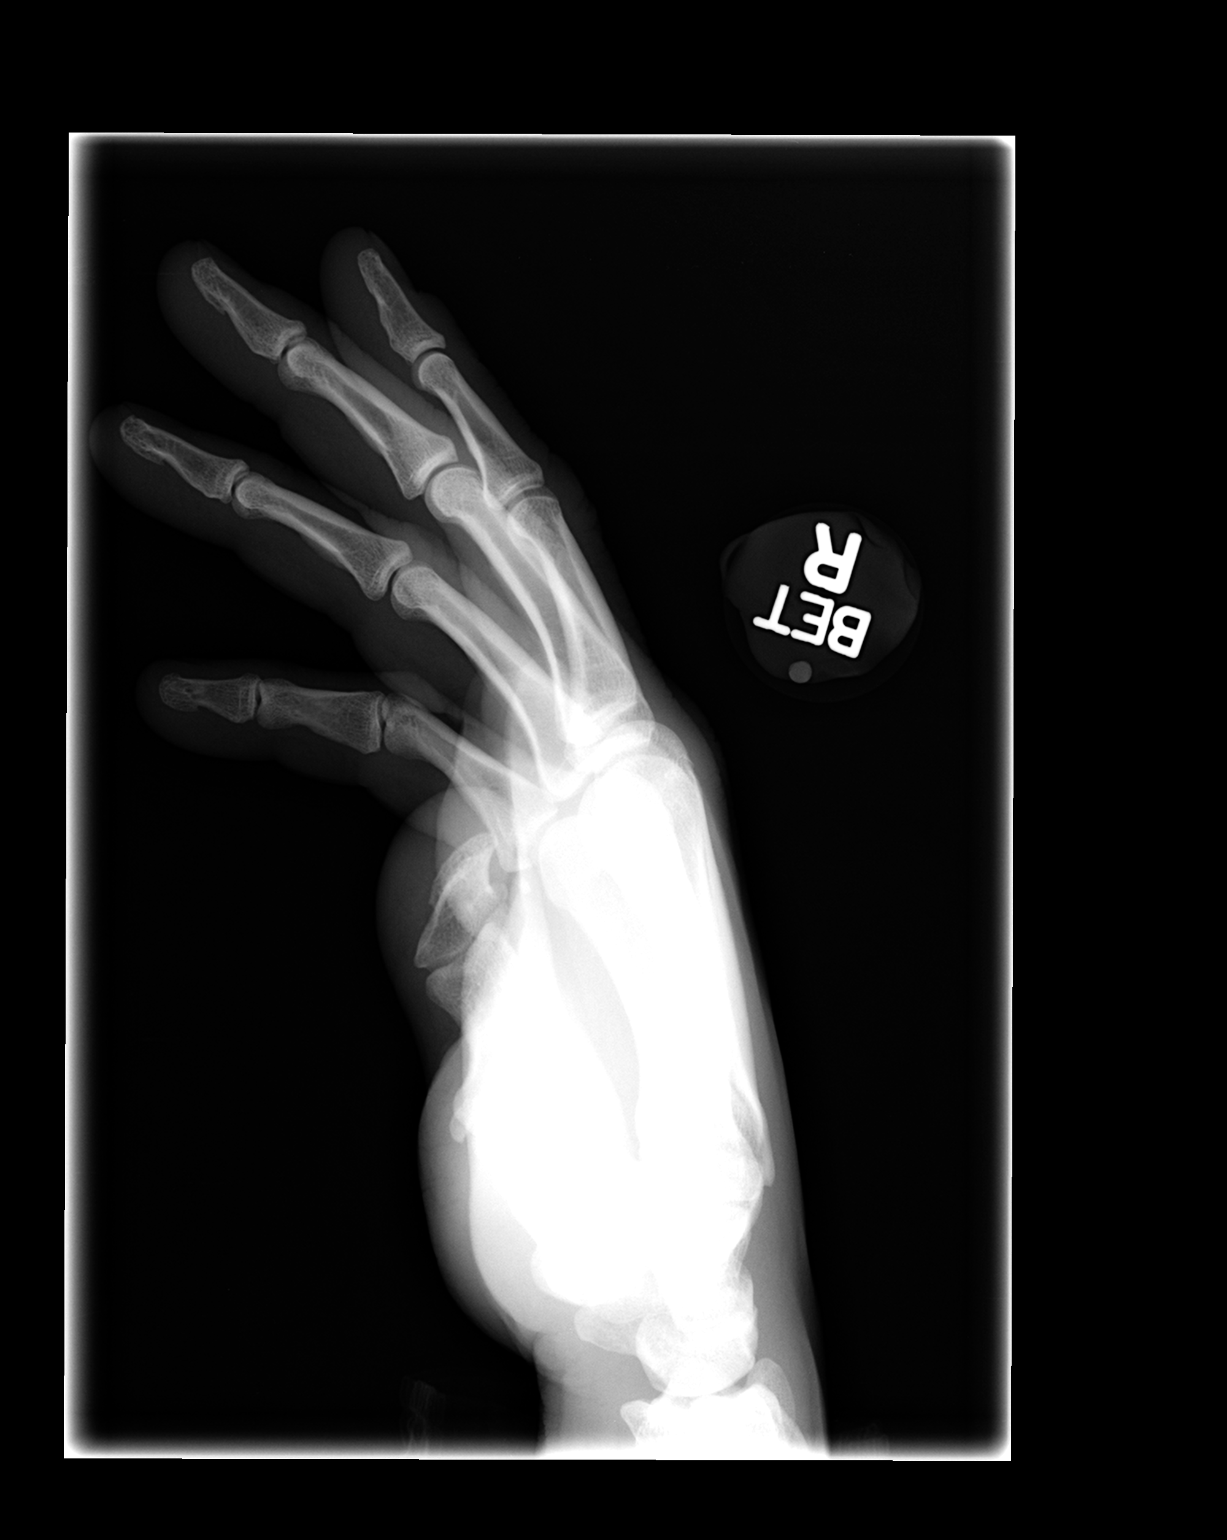

[3 of 3 positions shown; findings below may reference images not displayed]

FINDINGS: Minimally displaced oblique fracture of the mid fourth
metacarpal shaft.  No additional fractures are seen.

The joint spaces are preserved.

The visualized soft tissues are grossly unremarkable.
IMPRESSION: Minimally displaced fourth metacarpal fracture.

## 2011-07-30 MED ORDER — HYDROCODONE-ACETAMINOPHEN 7.5-500 MG/15ML PO SOLN: 10.0000 mL | ORAL | Status: AC | PRN

## 2011-07-30 MED ORDER — HYDROCODONE-ACETAMINOPHEN 7.5-500 MG/15ML PO SOLN
10.0000 mL | Freq: Once | ORAL | Status: AC
  Administered 2011-07-30: 10 mL via ORAL
  Filled 2011-07-30: qty 15

## 2011-07-30 MED ORDER — HYDROCODONE-ACETAMINOPHEN 5-325 MG PO TABS
1.0000 | ORAL_TABLET | Freq: Once | ORAL | Status: DC
  Filled 2011-07-30: qty 1

## 2011-07-30 NOTE — ED Notes (Signed)
Pt c/o rt hand pain after hand got tangled up with drill.

## 2011-07-30 NOTE — ED Notes (Signed)
Dr. Keeling here with pt  

## 2011-07-30 NOTE — ED Notes (Signed)
Pt unable to take tablets, requires liquid medication, pa notified, orders changed

## 2011-07-30 NOTE — Consult Note (Signed)
Reason for Consult: Right hand pain Referring Physician: ER physician and also PA  Troy Leon is an 40 y.o. male.  HPI: He hit his right hand today and has pain over the fourth metacarpal dorsally with swelling and inability to make a good grip. He has no other injury.  History reviewed. No pertinent past medical history.  History reviewed. No pertinent past surgical history.  History reviewed. No pertinent family history.  Social History:  reports that he has been smoking Cigarettes.  He does not have any smokeless tobacco history on file. His alcohol and drug histories not on file.  Allergies:  Allergies  Allergen Reactions  . Penicillins Other (See Comments)    UNKNOWN CHILDHOOD ALLERGY    Medications: I have reviewed the patient's current medications.  No results found for this or any previous visit (from the past 48 hour(s)).  Dg Hand Complete Right  07/30/2011  *RADIOLOGY REPORT*  Clinical Data: Right hand pain/swelling, fourth metacarpal  RIGHT HAND - COMPLETE 3+ VIEW  Comparison: None.  Findings: Minimally displaced oblique fracture of the mid fourth metacarpal shaft.  No additional fractures are seen.  The joint spaces are preserved.  The visualized soft tissues are grossly unremarkable.  IMPRESSION: Minimally displaced fourth metacarpal fracture.  Original Report Authenticated By: Charline Bills, M.D.    Review of Systems  Constitutional: Negative.   HENT: Negative.   Eyes: Negative.   Respiratory: Negative.   Cardiovascular: Negative.   Gastrointestinal: Negative.   Genitourinary: Negative.   Musculoskeletal: Positive for joint pain (right hand pain from injury tonight).  Skin: Negative.   Neurological: Negative.   Psychiatric/Behavioral: Negative.    Blood pressure 111/60, pulse 61, temperature 98.2 F (36.8 C), temperature source Oral, resp. rate 18, height 5\' 7"  (1.702 m), weight 68.04 kg (150 lb), SpO2 97.00%. Physical Exam  Nursing note and vitals  reviewed. Constitutional: He appears well-developed and well-nourished.  HENT:  Head: Normocephalic.  Eyes: Conjunctivae and EOM are normal.  Neck: Normal range of motion. Neck supple.  Cardiovascular: Normal rate and regular rhythm.   Respiratory: Effort normal and breath sounds normal.  GI: Soft.  Musculoskeletal: He exhibits edema (right hand) and tenderness (right fourth metacarpal and dorsal hand).       Right wrist: He exhibits decreased range of motion and bony tenderness (right hand dorsally and over fourth metacarpal).       Arms: Skin: Skin is warm and dry.  Psychiatric: He has a normal mood and affect. His behavior is normal. Judgment and thought content normal.    Assessment/Plan: Spiral nondisplaced fracture of the fourth metacarpal of the right dominant hand.  He will need a gutter splint on the hand.  He will need some splinting for four to six weeks.  ER will give Rx tonight.   I will see him in my office tomorrow morning or Thursday afternoon, Sept. 20, 2012.  Return to ER if any problem.  Troy Leon 07/30/2011, 8:19 PM

## 2011-07-30 NOTE — ED Provider Notes (Signed)
History     CSN: [id number] Arrival date & time: 07/30/2011  5:47 PM   Chief Complaint  Patient presents with  . Hand Pain     (Include location/radiation/quality/duration/timing/severity/associated sxs/prior treatment) HPI Comments: Patient c/o pain and swelling to his right hand that began after he was using a hand drill and the drill jammed causing him to strike his hand on a piece of wood,  C/o pain mostly over the fourth metacarpal and pain when he tries to extend the fourth finger.  Patient is right hand dominant.  Patient is a 40 y.o. male presenting with hand injury. The history is provided by the patient.  Hand Injury  The incident occurred 3 to 5 hours ago. The incident occurred at home. The injury mechanism was a direct blow and torsion. The pain is present in the right hand. The quality of the pain is described as throbbing and aching. The pain is moderate. The pain has been constant since the incident. Pertinent negatives include no fever and no malaise/fatigue. He reports no foreign bodies present. The symptoms are aggravated by movement, palpation and use. He has tried nothing for the symptoms. The treatment provided no relief.     History reviewed. No pertinent past medical history.   History reviewed. No pertinent past surgical history.  History reviewed. No pertinent family history.  History  Substance Use Topics  . Smoking status: Current Everyday Smoker    Types: Cigarettes  . Smokeless tobacco: Not on file  . Alcohol Use:       Review of Systems  Constitutional: Negative for fever and malaise/fatigue.  Musculoskeletal: Positive for joint swelling and arthralgias. Negative for back pain and gait problem.  Skin: Negative.   Neurological: Negative for weakness and numbness.  Hematological: Negative for adenopathy. Does not bruise/bleed easily.  All other systems reviewed and are negative.    Allergies  Penicillins  Home Medications   Current  Outpatient Rx  Name Route Sig Dispense Refill  . IBUPROFEN 200 MG PO TABS Oral Take 400 mg by mouth once as needed. For pain       Physical Exam    BP 111/60  Pulse 61  Temp(Src) 98.2 F (36.8 C) (Oral)  Resp 18  Ht 5\' 7"  (1.702 m)  Wt 150 lb (68.04 kg)  BMI 23.49 kg/m2  SpO2 97%  Physical Exam  Nursing note and vitals reviewed. Constitutional: He is oriented to person, place, and time. He appears well-developed and well-nourished. No distress.  HENT:  Head: Normocephalic and atraumatic.  Cardiovascular: Normal rate, regular rhythm and normal heart sounds.   Pulmonary/Chest: Effort normal and breath sounds normal.  Musculoskeletal: He exhibits edema and tenderness.       Hands: Neurological: He is alert and oriented to person, place, and time. He displays abnormal reflex. No cranial nerve deficit. He exhibits normal muscle tone. Coordination normal.  Skin: Skin is warm and dry.  Psychiatric: He has a normal mood and affect.    ED Course  ORTHOPEDIC INJURY TREATMENT Date/Time: 07/30/2011 8:30 PM Performed by: Trisha Mangle, Willow Reczek L. Authorized by: Linwood Dibbles R Consent: Verbal consent obtained. Written consent not obtained. Consent given by: patient Patient understanding: patient states understanding of the procedure being performed Patient consent: the patient's understanding of the procedure matches consent given Procedure consent: procedure consent matches procedure scheduled Imaging studies: imaging studies available Patient identity confirmed: verbally with patient Time out: Immediately prior to procedure a "time out" was called to verify the correct patient,  procedure, equipment, support staff and site/side marked as required. Injury location: hand Location details: right hand Injury type: fracture Fracture type: fourth metacarpal Pre-procedure neurovascular assessment: neurovascularly intact Pre-procedure distal perfusion: normal Pre-procedure neurological function:  normal Pre-procedure range of motion: reduced Local anesthesia used: no Patient sedated: no Manipulation performed: no Immobilization: splint Splint type: ulnar gutter Supplies used: Ortho-Glass Post-procedure neurovascular assessment: post-procedure neurovascularly intact Post-procedure distal perfusion: normal Post-procedure neurological function: normal Post-procedure range of motion: unchanged Patient tolerance: Patient tolerated the procedure well with no immediate complications.     Dg Hand Complete Right  07/30/2011  *RADIOLOGY REPORT*  Clinical Data: Right hand pain/swelling, fourth metacarpal  RIGHT HAND - COMPLETE 3+ VIEW  Comparison: None.  Findings: Minimally displaced oblique fracture of the mid fourth metacarpal shaft.  No additional fractures are seen.  The joint spaces are preserved.  The visualized soft tissues are grossly unremarkable.  IMPRESSION: Minimally displaced fourth metacarpal fracture.  Original Report Authenticated By: Charline Bills, M.D.       MDM  [phone] PM  Patient seen by Dr. Hilda Lias in ED.  Ulnar gutter splint applied by nursing staff.  Pt to f/u with Dr. Hilda Lias tomorrow in his office.    Pt feels improved after observation and/or treatment in ED.       Burrell Hodapp L. [place], [place] 08/04/11 1545

## 2011-08-06 NOTE — ED Provider Notes (Signed)
Medical screening examination/treatment/procedure(s) were performed by non-physician practitioner and as supervising physician I was immediately available for consultation/collaboration.   Celene Kras, MD 08/06/11 [phone]

## 2013-01-29 ENCOUNTER — Encounter (HOSPITAL_COMMUNITY): Payer: Self-pay

## 2013-01-29 ENCOUNTER — Observation Stay (HOSPITAL_COMMUNITY)
Admission: EM | Admit: 2013-01-29 | Discharge: 2013-01-31 | Disposition: A | Discharge status: Home / Self Care | Payer: Medicaid Other | Attending: Internal Medicine | Admitting: Internal Medicine

## 2013-01-29 DIAGNOSIS — R1013 Epigastric pain: Secondary | ICD-10-CM

## 2013-01-29 DIAGNOSIS — E872 Acidosis, unspecified: Secondary | ICD-10-CM

## 2013-01-29 DIAGNOSIS — D72829 Elevated white blood cell count, unspecified: Secondary | ICD-10-CM

## 2013-01-29 DIAGNOSIS — R1115 Cyclical vomiting syndrome unrelated to migraine: Principal | ICD-10-CM | POA: Insufficient documentation

## 2013-01-29 DIAGNOSIS — F17210 Nicotine dependence, cigarettes, uncomplicated: Secondary | ICD-10-CM

## 2013-01-29 DIAGNOSIS — F10929 Alcohol use, unspecified with intoxication, unspecified: Secondary | ICD-10-CM

## 2013-01-29 DIAGNOSIS — F1010 Alcohol abuse, uncomplicated: Secondary | ICD-10-CM

## 2013-01-29 DIAGNOSIS — E860 Dehydration: Secondary | ICD-10-CM

## 2013-01-29 DIAGNOSIS — R1110 Vomiting, unspecified: Secondary | ICD-10-CM

## 2013-01-29 DIAGNOSIS — F17200 Nicotine dependence, unspecified, uncomplicated: Secondary | ICD-10-CM | POA: Insufficient documentation

## 2013-01-29 NOTE — ED Notes (Signed)
He started drinking around 8 pm tonight. He doesn't normally drink per spouse. He has been vomiting per spouse.

## 2013-01-30 ENCOUNTER — Encounter (HOSPITAL_COMMUNITY): Payer: Self-pay | Admitting: *Deleted

## 2013-01-30 ENCOUNTER — Emergency Department (HOSPITAL_COMMUNITY): Payer: Medicaid Other

## 2013-01-30 DIAGNOSIS — F17210 Nicotine dependence, cigarettes, uncomplicated: Secondary | ICD-10-CM | POA: Diagnosis present

## 2013-01-30 DIAGNOSIS — R1110 Vomiting, unspecified: Secondary | ICD-10-CM | POA: Diagnosis present

## 2013-01-30 DIAGNOSIS — R1115 Cyclical vomiting syndrome unrelated to migraine: Secondary | ICD-10-CM

## 2013-01-30 DIAGNOSIS — F10929 Alcohol use, unspecified with intoxication, unspecified: Secondary | ICD-10-CM | POA: Diagnosis present

## 2013-01-30 DIAGNOSIS — D72829 Elevated white blood cell count, unspecified: Secondary | ICD-10-CM | POA: Diagnosis present

## 2013-01-30 DIAGNOSIS — F1010 Alcohol abuse, uncomplicated: Secondary | ICD-10-CM

## 2013-01-30 DIAGNOSIS — R1013 Epigastric pain: Secondary | ICD-10-CM | POA: Diagnosis present

## 2013-01-30 DIAGNOSIS — E872 Acidosis: Secondary | ICD-10-CM

## 2013-01-30 LAB — CBC WITH DIFFERENTIAL/PLATELET
Basophils Absolute: 0 10*3/uL (ref 0.0–0.1)
Basophils Relative: 0 % (ref 0–1)
Eosinophils Absolute: 0 10*3/uL (ref 0.0–0.7)
Eosinophils Relative: 0 % (ref 0–5)
HCT: 39.3 % (ref 39.0–52.0)
Hemoglobin: 14 g/dL (ref 13.0–17.0)
Lymphocytes Relative: 10 % — ABNORMAL LOW (ref 12–46)
Lymphs Abs: 1.4 10*3/uL (ref 0.7–4.0)
MCH: 31.6 pg (ref 26.0–34.0)
MCHC: 35.6 g/dL (ref 30.0–36.0)
MCV: 88.7 fL (ref 78.0–100.0)
Monocytes Absolute: 0.9 10*3/uL (ref 0.1–1.0)
Monocytes Relative: 6 % (ref 3–12)
Neutro Abs: 12.4 10*3/uL — ABNORMAL HIGH (ref 1.7–7.7)
Neutrophils Relative %: 84 % — ABNORMAL HIGH (ref 43–77)
Platelets: 157 10*3/uL (ref 150–400)
RBC: 4.43 MIL/uL (ref 4.22–5.81)
RDW: 12.4 % (ref 11.5–15.5)
WBC: 14.7 10*3/uL — ABNORMAL HIGH (ref 4.0–10.5)

## 2013-01-30 LAB — URINALYSIS, ROUTINE W REFLEX MICROSCOPIC
Bilirubin Urine: NEGATIVE
Glucose, UA: NEGATIVE mg/dL
Ketones, ur: NEGATIVE mg/dL
Leukocytes, UA: NEGATIVE
Nitrite: NEGATIVE
Protein, ur: NEGATIVE mg/dL
Specific Gravity, Urine: 1.02 (ref 1.005–1.030)
Urobilinogen, UA: 0.2 mg/dL (ref 0.0–1.0)
pH: 7 (ref 5.0–8.0)

## 2013-01-30 LAB — LIPASE, BLOOD: Lipase: 40 U/L (ref 11–59)

## 2013-01-30 LAB — COMPREHENSIVE METABOLIC PANEL
ALT: 25 U/L (ref 0–53)
AST: 26 U/L (ref 0–37)
Albumin: 4.2 g/dL (ref 3.5–5.2)
Alkaline Phosphatase: 47 U/L (ref 39–117)
BUN: 14 mg/dL (ref 6–23)
CO2: 21 mEq/L (ref 19–32)
Calcium: 8.9 mg/dL (ref 8.4–10.5)
Chloride: 102 mEq/L (ref 96–112)
Creatinine, Ser: 0.78 mg/dL (ref 0.50–1.35)
GFR calc Af Amer: >90 mL/min (ref >90)
GFR calc non Af Amer: >90 mL/min (ref >90)
Glucose, Bld: 133 mg/dL — ABNORMAL HIGH (ref 70–99)
Potassium: 3.4 mEq/L — ABNORMAL LOW (ref 3.5–5.1)
Sodium: 138 mEq/L (ref 135–145)
Total Bilirubin: 0.7 mg/dL (ref 0.3–1.2)
Total Protein: 6.8 g/dL (ref 6.0–8.3)

## 2013-01-30 LAB — URINE MICROSCOPIC-ADD ON

## 2013-01-30 LAB — LACTIC ACID, PLASMA
Lactic Acid, Venous: 3.9 mmol/L — ABNORMAL HIGH (ref 0.5–2.2)
Lactic Acid, Venous: 2.4 mmol/L — ABNORMAL HIGH (ref 0.5–2.2)

## 2013-01-30 LAB — ETHANOL: Alcohol, Ethyl (B): 68 mg/dL — ABNORMAL HIGH (ref 0–11)

## 2013-01-30 IMAGING — CT CT ABD-PELV W/ CM
2 of 3 series · 15 of 46 positions shown, 17 images · IV contrast (Omnipaque 300)
Comparison: [DATE] ultrasound]

CLINICAL DATA: Abdominal pain, EtOH intoxication

CT ABDOMEN AND PELVIS WITH CONTRAST
TECHNIQUE: Multidetector CT imaging of the abdomen and pelvis was
performed following the standard protocol during bolus
administration of intravenous contrast.
Contrast: 50mL OMNIPAQUE IOHEXOL 300 MG/ML  SOLN, 100mL OMNIPAQUE
IOHEXOL 300 MG/ML  SOLN

[Series 2: abd_pel_with 5.0 b40f · axial · 0.65mm/px · z∈[-530,-155]mm · 12 of 87 slices shown, 14 images]
[im 6/87  soft-tissue]
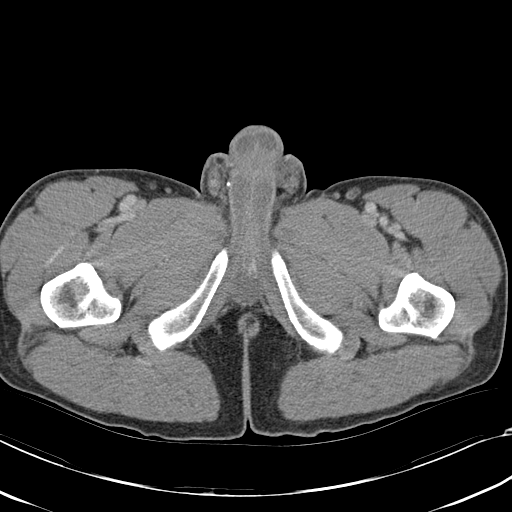
[im 6/87  bone]
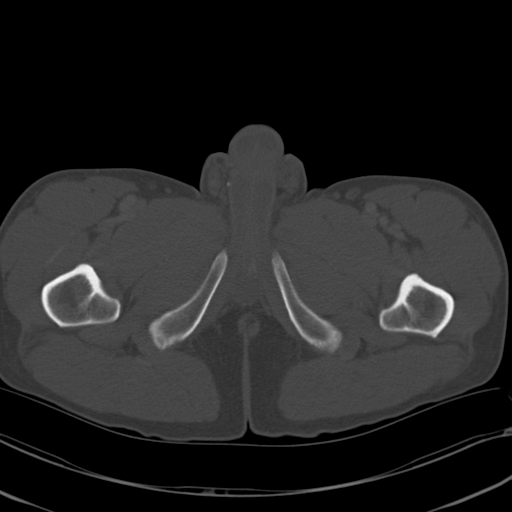
[im 12/87  soft-tissue]
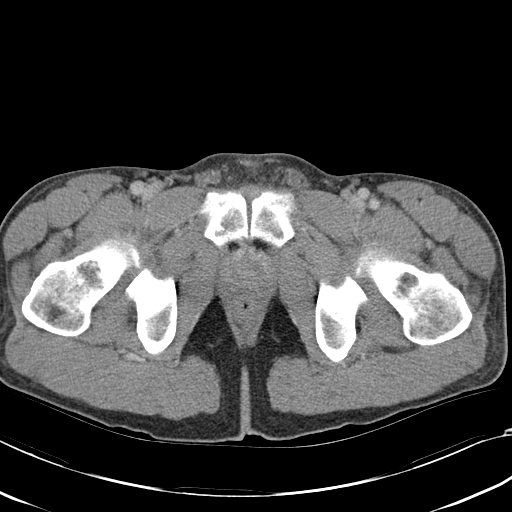
[im 20/87  soft-tissue]
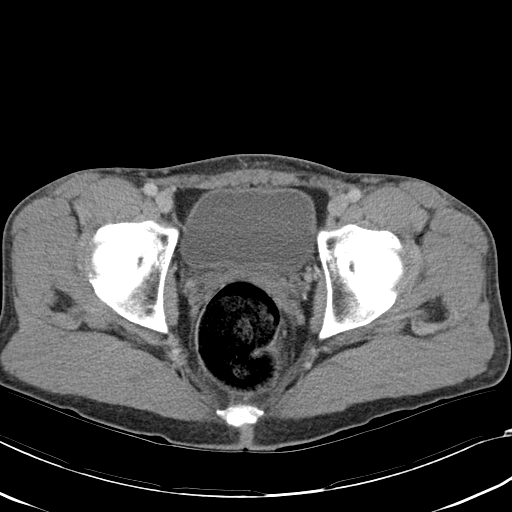
[im 25/87  soft-tissue]
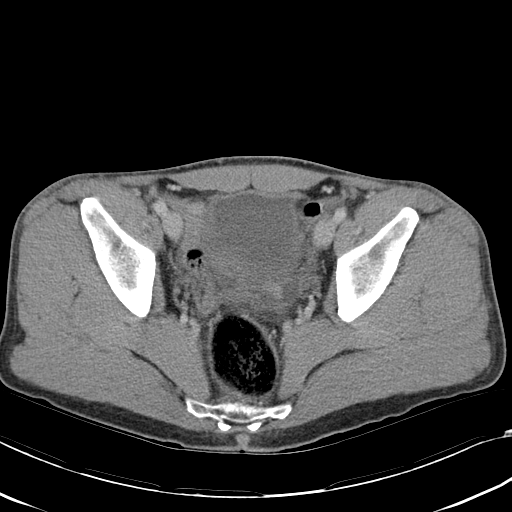
[im 34/87  soft-tissue]
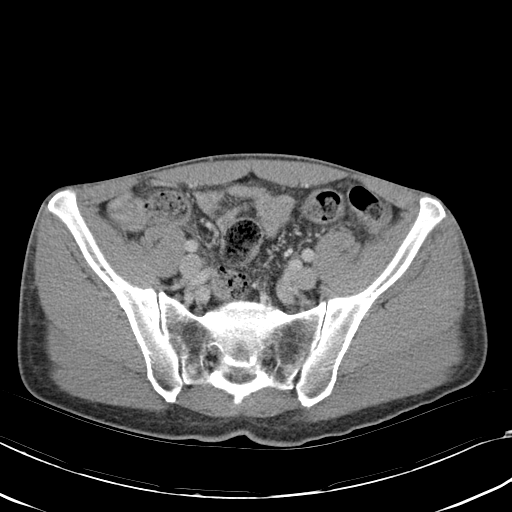
[im 39/87  soft-tissue]
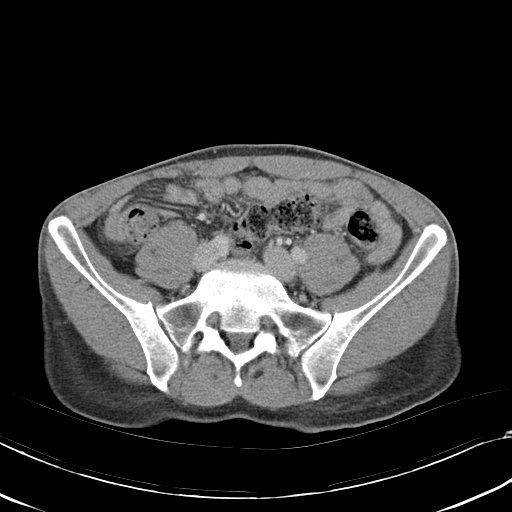
[im 48/87  soft-tissue]
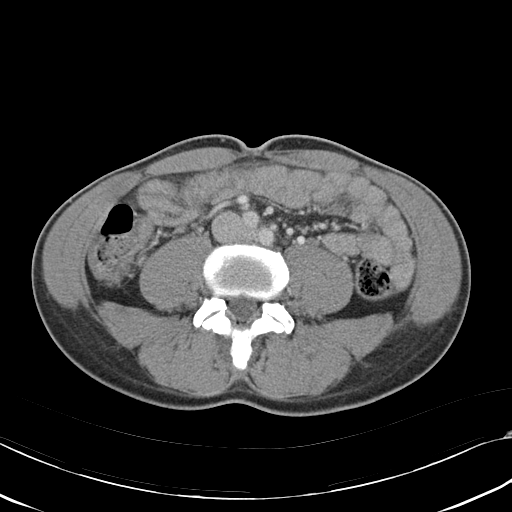
[im 53/87  soft-tissue]
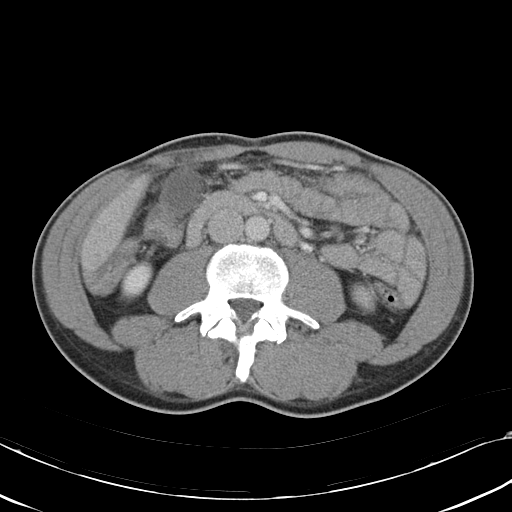
[im 62/87  soft-tissue]
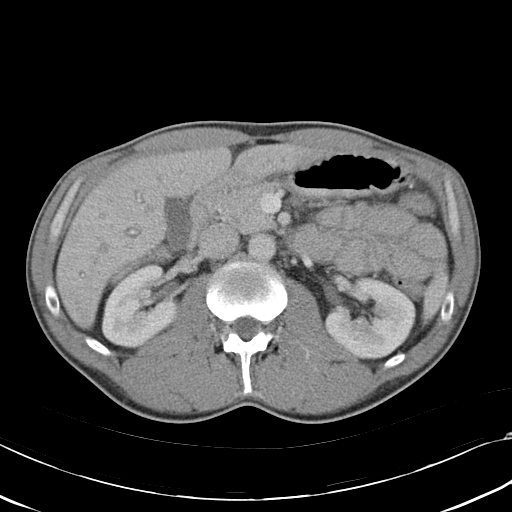
[im 62/87  bone]
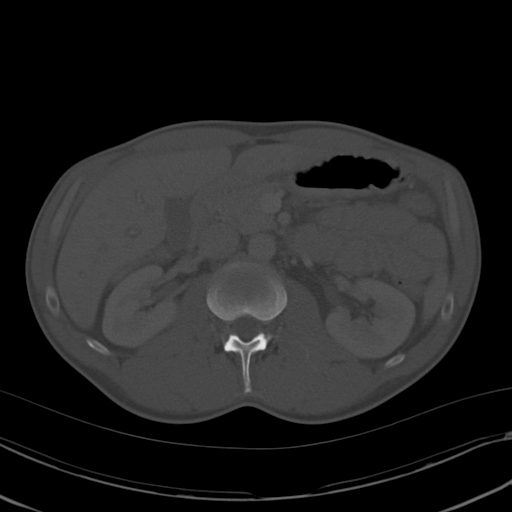
[im 67/87  soft-tissue]
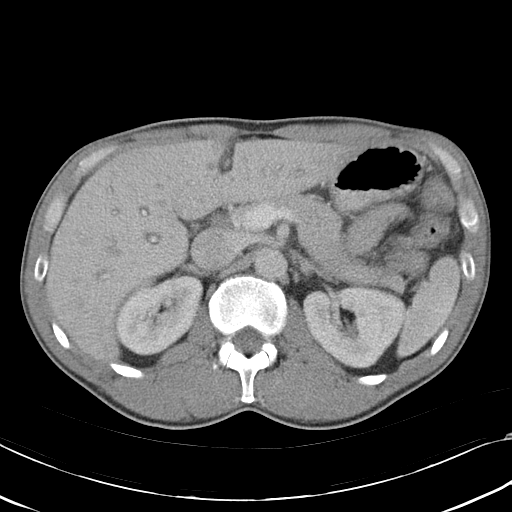
[im 75/87  soft-tissue]
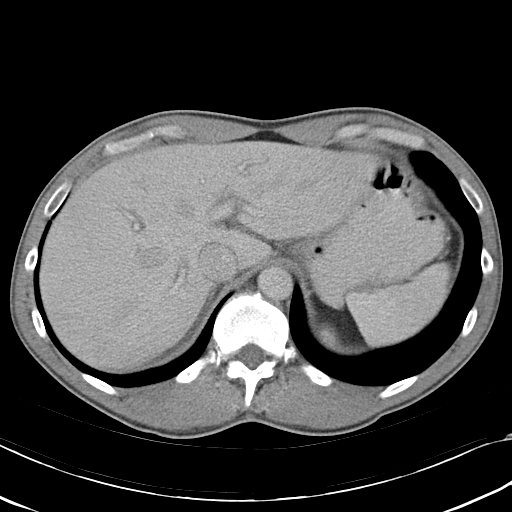
[im 81/87  soft-tissue]
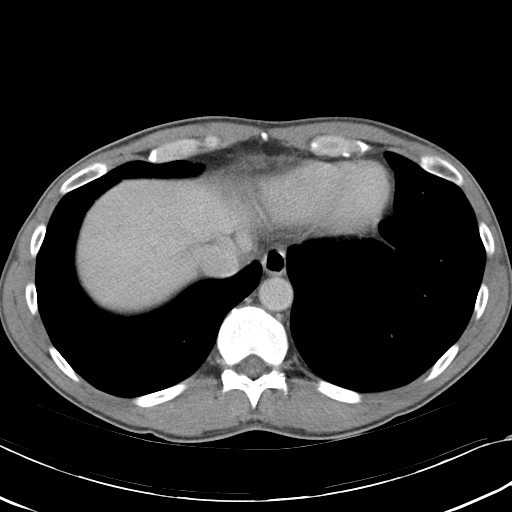

[Series 4: abd_pel_with 3.0 spo cor · coronal · 0.55mm/px · 3 of 72 slices shown]
[im 24/72  soft-tissue]
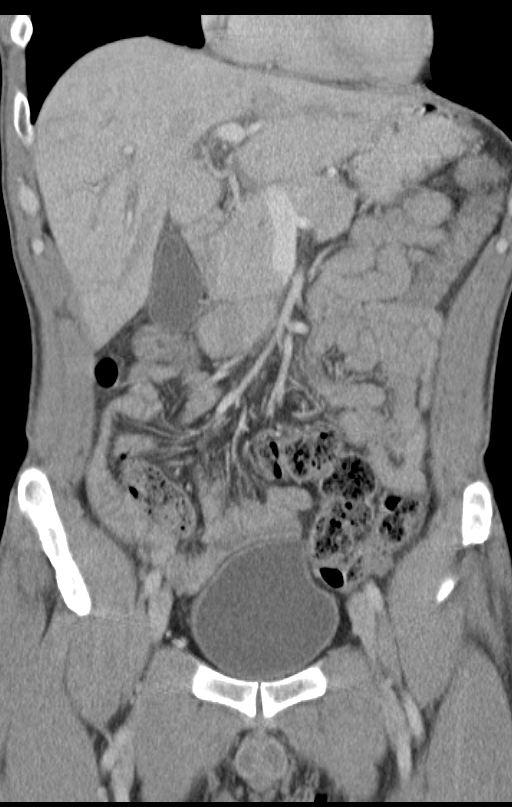
[im 32/72  soft-tissue]
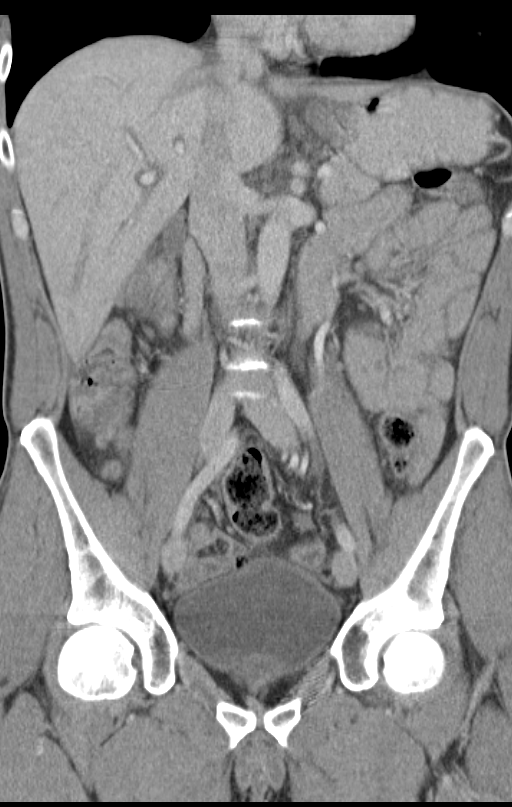
[im 40/72  soft-tissue]
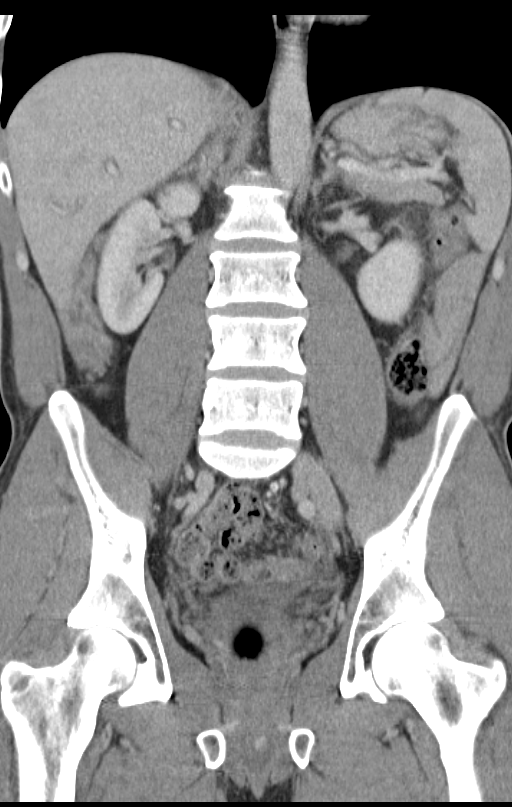

[15 of 46 positions shown; findings below may reference images not displayed]

FINDINGS: Limited images through the lung bases demonstrate no
significant appreciable abnormality. The heart size is within
normal limits. No pleural or pericardial effusion.

Periportal edema.  Otherwise, unremarkable liver, spleen, pancreas,
adrenal glands.  Mild gallbladder wall thickening.  No radiodense
gallstones.  No biliary ductal dilatation.

Symmetric renal enhancement.  No hydronephrosis or hydroureter.

No CT evidence for colitis.  Normal appendix.  Small bowel loops
are normal course and caliber.  No free intraperitoneal air or.
There is haziness of the retro pancreatic fat and trace amount of
fluid tracking inferiorly along the inferior mesenteric artery
(series 2 image 38).  No lymphadenopathy.

Normal caliber aorta and branch vessels.  Patent portal
vasculature.  Mild bladder wall thickening is nonspecific given
incomplete distension.

No acute osseous finding.
IMPRESSION: Nonspecific gallbladder wall edema.  Correlate with ultrasound if
clinically warranted.

Retro pancreatic fat stranding may reflect acute or sequelae of
prior pancreatitis.  No CT evidence for necrosis, abscess, or
pseudocyst.

Trace fluid tracking inferiorly along the inferior mesenteric
artery may relate to pancreatic pathology or a nonspecific
enteritis (infectious, inflammatory, or ischemic considerations).
The bowel loops themselves show no overt pathologic findings such
as wall thickening or wall pneumatosis.

## 2013-01-30 MED ORDER — POTASSIUM CHLORIDE IN NACL 40-0.9 MEQ/L-% IV SOLN
INTRAVENOUS | Status: DC
  Administered 2013-01-30 (×2): via INTRAVENOUS

## 2013-01-30 MED ORDER — HYDROMORPHONE HCL PF 1 MG/ML IJ SOLN
1.0000 mg | Freq: Once | INTRAMUSCULAR | Status: AC
  Administered 2013-01-30: 1 mg via INTRAVENOUS
  Filled 2013-01-30: qty 1

## 2013-01-30 MED ORDER — ONDANSETRON HCL 4 MG/2ML IJ SOLN
4.0000 mg | Freq: Once | INTRAMUSCULAR | Status: AC
  Administered 2013-01-30: 4 mg via INTRAVENOUS
  Filled 2013-01-30: qty 2

## 2013-01-30 MED ORDER — ALUM & MAG HYDROXIDE-SIMETH 200-200-20 MG/5ML PO SUSP: 30.0000 mL | Freq: Four times a day (QID) | ORAL | Status: DC | PRN

## 2013-01-30 MED ORDER — METOCLOPRAMIDE HCL 5 MG/ML IJ SOLN
10.0000 mg | Freq: Once | INTRAMUSCULAR | Status: AC
  Administered 2013-01-30: 10 mg via INTRAVENOUS
  Filled 2013-01-30: qty 2

## 2013-01-30 MED ORDER — SODIUM CHLORIDE 0.9 % IV BOLUS (SEPSIS)
1000.0000 mL | Freq: Once | INTRAVENOUS | Status: AC
  Administered 2013-01-30: 1000 mL via INTRAVENOUS

## 2013-01-30 MED ORDER — SODIUM CHLORIDE 0.9 % IV SOLN
Freq: Once | INTRAVENOUS | Status: AC
  Administered 2013-01-30: 01:00:00 via INTRAVENOUS

## 2013-01-30 MED ORDER — PROMETHAZINE HCL 25 MG/ML IJ SOLN
12.5000 mg | Freq: Four times a day (QID) | INTRAMUSCULAR | Status: DC | PRN
  Administered 2013-01-30: 25 mg via INTRAVENOUS
  Filled 2013-01-30: qty 1

## 2013-01-30 MED ORDER — NICOTINE 21 MG/24HR TD PT24
21.0000 mg | MEDICATED_PATCH | Freq: Every day | TRANSDERMAL | Status: DC
  Administered 2013-01-30: 21 mg via TRANSDERMAL
  Filled 2013-01-30: qty 1

## 2013-01-30 MED ORDER — ONDANSETRON HCL 4 MG PO TABS: 4.0000 mg | ORAL_TABLET | Freq: Four times a day (QID) | ORAL | Status: DC | PRN

## 2013-01-30 MED ORDER — THIAMINE HCL 100 MG/ML IJ SOLN
100.0000 mg | Freq: Every day | INTRAMUSCULAR | Status: DC
  Administered 2013-01-30: 100 mg via INTRAVENOUS
  Filled 2013-01-30: qty 2

## 2013-01-30 MED ORDER — IOHEXOL 300 MG/ML  SOLN
100.0000 mL | Freq: Once | INTRAMUSCULAR | Status: AC | PRN
  Administered 2013-01-30: 100 mL via INTRAVENOUS

## 2013-01-30 MED ORDER — IOHEXOL 300 MG/ML  SOLN
50.0000 mL | Freq: Once | INTRAMUSCULAR | Status: AC | PRN
  Administered 2013-01-30: 50 mL via ORAL

## 2013-01-30 MED ORDER — PANTOPRAZOLE SODIUM 40 MG IV SOLR
40.0000 mg | Freq: Two times a day (BID) | INTRAVENOUS | Status: DC
  Administered 2013-01-30 (×2): 40 mg via INTRAVENOUS
  Filled 2013-01-30 (×2): qty 40

## 2013-01-30 MED ORDER — ONDANSETRON HCL 4 MG/2ML IJ SOLN
4.0000 mg | Freq: Four times a day (QID) | INTRAMUSCULAR | Status: DC | PRN
  Administered 2013-01-30 (×2): 4 mg via INTRAVENOUS
  Filled 2013-01-30 (×2): qty 2

## 2013-01-30 MED ORDER — SODIUM CHLORIDE 0.9 % IV SOLN: INTRAVENOUS | Status: DC

## 2013-01-30 MED ORDER — ACETAMINOPHEN 325 MG PO TABS: 650.0000 mg | ORAL_TABLET | Freq: Four times a day (QID) | ORAL | Status: DC | PRN

## 2013-01-30 MED ORDER — ACETAMINOPHEN 650 MG RE SUPP: 650.0000 mg | Freq: Four times a day (QID) | RECTAL | Status: DC | PRN

## 2013-01-30 MED ORDER — MORPHINE SULFATE 2 MG/ML IJ SOLN
2.0000 mg | Freq: Once | INTRAMUSCULAR | Status: AC
  Administered 2013-01-30: 2 mg via INTRAVENOUS
  Filled 2013-01-30: qty 1

## 2013-01-30 MED ORDER — HYDROMORPHONE HCL PF 1 MG/ML IJ SOLN: 0.5000 mg | INTRAMUSCULAR | Status: DC | PRN

## 2013-01-30 MED ORDER — PANTOPRAZOLE SODIUM 40 MG IV SOLR
40.0000 mg | Freq: Once | INTRAVENOUS | Status: AC
  Administered 2013-01-30: 40 mg via INTRAVENOUS
  Filled 2013-01-30: qty 40

## 2013-01-30 NOTE — ED Provider Notes (Signed)
History     CSN: [id number]  Arrival date & time 01/29/13  2330   First MD Initiated Contact with Patient 01/29/13 2351      Chief Complaint  Patient presents with  . Alcohol Intoxication   Patient is a 42 y.o. male presenting with intoxication. The history is provided by the patient and the spouse. The history is limited by the condition of the patient.  Alcohol Intoxication This is a new problem. The current episode started 3 to 5 hours ago. The problem occurs constantly. The problem has been gradually worsening. Associated symptoms include abdominal pain. Exacerbated by: drinking. Nothing relieves the symptoms. He has tried rest for the symptoms. The treatment provided no relief.  PT consumed large amount of alcohol earlier this evening for his birthday party Wife reports he drank large amount of liquor and he usually does not drink ETOH No falls/trauma or head injury is reported He has had nausea and vomiting  PMH - none  Surgical history  - none   History  Substance Use Topics  . Smoking status: Current Every Day Smoker    Types: Cigarettes  . Smokeless tobacco: Not on file  . Alcohol Use: Yes      Review of Systems  Unable to perform ROS: Mental status change  Gastrointestinal: Positive for abdominal pain.    Allergies  Penicillins  Home Medications   Current Outpatient Rx  Name  Route  Sig  Dispense  Refill  . ibuprofen (ADVIL,MOTRIN) 200 MG tablet   Oral   Take 400 mg by mouth once as needed. For pain            BP 143/106  Pulse 65  Ht 5\' 7"  (1.702 m)  Wt 155 lb (70.308 kg)  BMI 24.27 kg/m2  SpO2 100% BP 98/66  Pulse 72  Temp(Src) 97.2 F (36.2 C) (Axillary)  Resp 44  Ht 5\' 7"  (1.702 m)  Wt 155 lb (70.308 kg)  BMI 24.27 kg/m2  SpO2 100%  Physical Exam CONSTITUTIONAL: disheveled HEAD: Normocephalic/atraumatic, no signs of trauma EYES: EOMI/PERRL, no icterus ENMT: Mucous membranes dry NECK: supple no meningeal signs CV: S1/S2 noted,  no murmurs/rubs/gallops noted LUNGS: Lungs are clear to auscultation bilaterally, no apparent distress ABDOMEN: soft, nontender, no rebound or guarding GU:no cva tenderness NEURO: Pt is somnlent but easily arousable.  He will wake up and start to vomit.  He moves all extremities without difficulty EXTREMITIES: pulses normal, full ROM, no signs of trauam SKIN: warm, color normal PSYCH: somnolent  ED Course  Procedures   12:13 AM Pt with acute alcohol intoxication and nausea/vomiting Will rehydrate and give antiemetics and reassess 1:53 AM Pt now awake, screaming in pain He reports he has had pancreatitis from "binge drinking" whereas initially his wife reported he never drank Will treat pain, check labs and reassess He has focal epigastric tenderness but his abdomen is soft without rebound 3:05 AM Pt continues to have abdominal pain, elevated lactate, will check CT scan of abd/pelvis 6:03 AM Lactate improved though still mildly elevated He still has diffuse abdominal pain with nausea/vomiting Ct shows ?pancreatitis Will call for admission due to persistent pain/intractable vomiting 6:10 AM D/w dr Orvan Falconer Will admit to OBS in the hospital for rehydration, pain control.  He may need inpatient formal RUQ ultrasound.  Suspect he may have component of pancreatitis as well.   MDM  Nursing notes including past medical history and social history reviewed and considered in documentation Labs/vital reviewed and considered  Joya Gaskins, MD 01/30/13 [phone]

## 2013-01-30 NOTE — H&P (Signed)
Hospital Admission Note Date: 01/30/2013  Patient name: Troy Leon Medical record number: [id number] Date of birth: [birth year] Age: 42 y.o. Gender: male PCP: Lilyan Punt, MD  Attending physician: Vania Rea, MD  Chief Complaint: Vomiting and abdominal pain  History of Present Illness:  Troy Leon is an 42 y.o. male who was brought to the emergency room by his wife with intractable vomiting and epigastric pain. He has received anti-emetics and pain medication, so the patient's wife provides most of the history. She reports that she threw him a surprise birthday party and he was doing alcohol shots with friends. He reports that he drank about a fifth of liquor. He became ill late in the evening with intractable vomiting and severe epigastric and upper abdominal pain. He had been well prior to the party. He denies any hematemesis. Currently he is feeling better. In the ED, he was noted to have a lactic acidosis and leukocytosis. CAT scan was done which showed nonspecific gallbladder wall thickening and possibly mild pancreatitis. Lipase is normal. Liver function tests are normal. The ED physician spoke with Dr. Orvan Falconer who felt it would be reasonable to place patient on observation. Patient denies having taken anything else, i.e. illicit drugs.  History reviewed. No pertinent past medical history.  Meds: Prescriptions prior to admission  Medication Sig Dispense Refill  . ibuprofen (ADVIL,MOTRIN) 200 MG tablet Take 400 mg by mouth once as needed. For pain         Allergies: Penicillins  Social history: Patient's wife reports that he rarely drinks. He smokes a pack of cigarettes a day. Denies drug use, but on previous hospitalizations has admitted to marijuana use. Patient works part-time as a Psychologist, occupational.  Family history: Mother has rheumatoid arthritis. Brother has a history of diverticulitis.  Surgical history: None  Review of Systems: Systems reviewed and as per HPI,  otherwise negative.  Physical Exam: Blood pressure 98/66, pulse 72, temperature 97.2 F (36.2 C), temperature source Axillary, resp. rate 44, height 5\' 7"  (1.702 m), weight 70.308 kg (155 lb), SpO2 100.00%. BP 98/66  Pulse 72  Temp(Src) 97.2 F (36.2 C) (Axillary)  Resp 44  Ht 5\' 7"  (1.702 m)  Wt 70.308 kg (155 lb)  BMI 24.27 kg/m2  SpO2 100%  General Appearance:   groggy thin white male who is able to answer a few questions and follow a few commands but quickly falls back asleep.   Head:    Normocephalic, without obvious abnormality, atraumatic  Eyes:    PERRL, conjunctiva/corneas clear, EOM's intact, fundi    benign, both eyes       Ears:    Normal TM's and external ear canals, both ears  Nose:   Nares normal, septum midline, mucosa normal, no drainage    or sinus tenderness  Throat:   Lips, mucosa, and tongue normal; teeth and gums normal  Neck:   Supple, symmetrical, trachea midline, no adenopathy;       thyroid:  No enlargement/tenderness/nodules; no carotid   bruit or JVD  Back:     Symmetric, no curvature, ROM normal, no CVA tenderness  Lungs:     Clear to auscultation bilaterally, respirations unlabored  Chest wall:    No tenderness or deformity  Heart:    Regular rate and rhythm, S1 and S2 normal, no murmur, rub   or gallop  Abdomen:     Soft, non-tender, bowel sounds active all four quadrants,    no masses, no organomegaly  Genitalia:   deferred  Rectal:   deferred   Extremities:   Extremities normal, atraumatic, no cyanosis or edema  Pulses:   2+ and symmetric all extremities  Skin:   Skin color, texture, turgor normal, no rashes or lesions.  multiple tattoos   Lymph nodes:   Cervical, supraclavicular, and axillary nodes normal  Neurologic:   CNII-XII intact. Normal strength, sensation and reflexes      throughout    Psychiatric: Cooperative without agitation or combativeness  Lab results: Basic Metabolic Panel:  Recent Labs  16/10/96 0154  NA 138  K 3.4*   CL 102  CO2 21  GLUCOSE 133*  BUN 14  CREATININE 0.78  CALCIUM 8.9   Liver Function Tests:  Recent Labs  01/30/13 0154  AST 26  ALT 25  ALKPHOS 47  BILITOT 0.7  PROT 6.8  ALBUMIN 4.2    Recent Labs  01/30/13 0154  LIPASE 40   No results found for this basename: AMMONIA,  in the last 72 hours CBC:  Recent Labs  01/30/13 0154  WBC 14.7*  NEUTROABS 12.4*  HGB 14.0  HCT 39.3  MCV 88.7  PLT 157   Alcohol Level:  Recent Labs  01/30/13 0154  ETH 68*   Urinalysis:  Recent Labs  01/30/13 0345  COLORURINE YELLOW  LABSPEC 1.020  PHURINE 7.0  GLUCOSEU NEGATIVE  HGBUR TRACE*  BILIRUBINUR NEGATIVE  KETONESUR NEGATIVE  PROTEINUR NEGATIVE  UROBILINOGEN 0.2  NITRITE NEGATIVE  LEUKOCYTESUR NEGATIVE   Imaging results:  Ct Abdomen Pelvis W Contrast  01/30/2013  *RADIOLOGY REPORT*  Clinical Data: Abdominal pain, EtOH intoxication  CT ABDOMEN AND PELVIS WITH CONTRAST  Technique:  Multidetector CT imaging of the abdomen and pelvis was performed following the standard protocol during bolus administration of intravenous contrast.  Contrast: 50mL OMNIPAQUE IOHEXOL 300 MG/ML  SOLN, OMNIPAQUE IOHEXOL 300 MG/ML  SOLN  Comparison: 01/16/2010 ultrasound  Findings: Limited images through the lung bases demonstrate no significant appreciable abnormality. The heart size is within normal limits. No pleural or pericardial effusion.  Periportal edema.  Otherwise, unremarkable liver, spleen, pancreas, adrenal glands.  Mild gallbladder wall thickening.  No radiodense gallstones.  No biliary ductal dilatation.  Symmetric renal enhancement.  No hydronephrosis or hydroureter.  No CT evidence for colitis.  Normal appendix.  Small bowel loops are normal course and caliber.  No free intraperitoneal air or. There is haziness of the retro pancreatic fat and trace amount of fluid tracking inferiorly along the inferior mesenteric artery (series 2 image 38).  No lymphadenopathy.  Normal caliber  aorta and branch vessels.  Patent portal vasculature.  Mild bladder wall thickening is nonspecific given incomplete distension.  No acute osseous finding.  IMPRESSION: Nonspecific gallbladder wall edema.  Correlate with ultrasound if clinically warranted.  Retro pancreatic fat stranding may reflect acute or sequelae of prior pancreatitis.  No CT evidence for necrosis, abscess, or pseudocyst.  Trace fluid tracking inferiorly along the inferior mesenteric artery may relate to pancreatic pathology or a nonspecific enteritis (infectious, inflammatory, or ischemic considerations). The bowel loops themselves show no overt pathologic findings such as wall thickening or wall pneumatosis.   Original Report Authenticated By: Jearld Lesch, M.D.     Assessment & Plan: Principal Problem:   Intractable vomiting Active Problems:   Abdominal pain, epigastric   Alcohol intoxication   Leukocytosis, unspecified   Lactic acidosis   Cigarette smoker  Patient is no longer vomiting. His pain has subsided. Could be gastritis. Will give IV protonix. Thiamine. IV  fluids with potassium. Antiemetics and pain medication as needed. Monitor. If symptoms resolve and patient is able to tolerate clear liquids, we'll likely be able to go home without further testing today. Should he continue to have symptoms, abdominal ultrasound would be reasonable, to further evaluate gallbladder wall thickening.  Tracie Dore L 01/30/2013, 8:48 AM

## 2013-01-30 NOTE — ED Notes (Signed)
Unable to obtain oral or axillary temperature. Patient refusing rectal temp at this time.

## 2013-01-30 NOTE — ED Notes (Signed)
Attempted to call report. RN stated she would call back.

## 2013-01-30 NOTE — ED Notes (Signed)
Walked into pt's room to find him placing fingers down his throat in order to induce vomiting. Pt stated "I had to do it. It has to come out".

## 2013-01-30 NOTE — ED Notes (Signed)
Pt's pain has appeared to decreased slightly as well as the frequency of vomiting episodes. Requesting something to drink. Will be given contrast to drink for CT. Pt aware of need for urine sample and states he will attempt to provide one soon.

## 2013-01-30 NOTE — ED Notes (Signed)
Pt states he is starting to feel better and is taking small sips of oral contrast.

## 2013-01-30 NOTE — ED Notes (Signed)
Pt reports return of nausea and vomiting. Dr. Bebe Shaggy notified.

## 2013-01-31 LAB — COMPREHENSIVE METABOLIC PANEL
ALT: 23 U/L (ref 0–53)
AST: 20 U/L (ref 0–37)
Albumin: 3.3 g/dL — ABNORMAL LOW (ref 3.5–5.2)
Alkaline Phosphatase: 43 U/L (ref 39–117)
BUN: 11 mg/dL (ref 6–23)
CO2: 25 mEq/L (ref 19–32)
Calcium: 8.5 mg/dL (ref 8.4–10.5)
Chloride: 108 mEq/L (ref 96–112)
Creatinine, Ser: 0.83 mg/dL (ref 0.50–1.35)
GFR calc Af Amer: >90 mL/min (ref >90)
GFR calc non Af Amer: >90 mL/min (ref >90)
Glucose, Bld: 114 mg/dL — ABNORMAL HIGH (ref 70–99)
Potassium: 4 mEq/L (ref 3.5–5.1)
Sodium: 140 mEq/L (ref 135–145)
Total Bilirubin: 1.2 mg/dL (ref 0.3–1.2)
Total Protein: 5.5 g/dL — ABNORMAL LOW (ref 6.0–8.3)

## 2013-01-31 LAB — CBC WITH DIFFERENTIAL/PLATELET
Basophils Absolute: 0 10*3/uL (ref 0.0–0.1)
Basophils Relative: 0 % (ref 0–1)
Eosinophils Absolute: 0 10*3/uL (ref 0.0–0.7)
Eosinophils Relative: 0 % (ref 0–5)
HCT: 36.3 % — ABNORMAL LOW (ref 39.0–52.0)
Hemoglobin: 12.5 g/dL — ABNORMAL LOW (ref 13.0–17.0)
Lymphocytes Relative: 29 % (ref 12–46)
Lymphs Abs: 2.5 10*3/uL (ref 0.7–4.0)
MCH: 31.3 pg (ref 26.0–34.0)
MCHC: 34.4 g/dL (ref 30.0–36.0)
MCV: 90.8 fL (ref 78.0–100.0)
Monocytes Absolute: 0.5 10*3/uL (ref 0.1–1.0)
Monocytes Relative: 6 % (ref 3–12)
Neutro Abs: 5.5 10*3/uL (ref 1.7–7.7)
Neutrophils Relative %: 65 % (ref 43–77)
Platelets: 154 10*3/uL (ref 150–400)
RBC: 4 MIL/uL — ABNORMAL LOW (ref 4.22–5.81)
RDW: 12.9 % (ref 11.5–15.5)
WBC: 8.4 10*3/uL (ref 4.0–10.5)

## 2013-01-31 LAB — LIPASE, BLOOD: Lipase: 53 U/L (ref 11–59)

## 2013-01-31 MED ORDER — OMEPRAZOLE 40 MG PO CPDR: 40.0000 mg | DELAYED_RELEASE_CAPSULE | Freq: Every day | ORAL | Status: DC

## 2013-01-31 NOTE — Discharge Summary (Signed)
Physician Discharge Summary  Patient ID: Troy Leon MRN: [id number] DOB/AGE: October 02, 1971 42 y.o.  Admit date: 01/29/2013 Discharge date: 01/31/2013  Discharge Diagnoses:  Principal Problem:   Intractable vomiting Active Problems:   Abdominal pain, epigastric   Alcohol intoxication   Leukocytosis, unspecified   Lactic acidosis   Cigarette smoker     Medication List    TAKE these medications       omeprazole 40 MG capsule  Commonly known as:  PRILOSEC  Take 1 capsule (40 mg total) by mouth daily. If pain continues            Discharge Orders   Future Orders Complete By Expires     Activity as tolerated - No restrictions  As directed     Diet general  As directed     Discharge instructions  As directed     Comments:      Abstain from heavy alcohol intake         Disposition: 01-Home or Self Care  Discharged Condition: Stable  Consults:   non-  Labs:   Results for orders placed during the hospital encounter of 01/29/13 (from the past 48 hour(s))  COMPREHENSIVE METABOLIC PANEL     Status: Abnormal   Collection Time    01/30/13  1:54 AM      Result Value Range   Sodium 138  135 - 145 mEq/L   Potassium 3.4 (*) 3.5 - 5.1 mEq/L   Chloride 102  96 - 112 mEq/L   CO2 21  19 - 32 mEq/L   Glucose, Bld 133 (*) 70 - 99 mg/dL   BUN 14  6 - 23 mg/dL   Creatinine, Ser 2.13  0.50 - 1.35 mg/dL   Calcium 8.9  8.4 - 08.6 mg/dL   Total Protein 6.8  6.0 - 8.3 g/dL   Albumin 4.2  3.5 - 5.2 g/dL   AST 26  0 - 37 U/L   ALT 25  0 - 53 U/L   Alkaline Phosphatase 47  39 - 117 U/L   Total Bilirubin 0.7  0.3 - 1.2 mg/dL   GFR calc non Af Amer >90  >90 mL/min   GFR calc Af Amer >90  >90 mL/min   Comment:            The eGFR has been calculated     using the CKD EPI equation.     This calculation has not been     validated in all clinical     situations.     eGFR's persistently     <90 mL/min signify     possible Chronic Kidney Disease.  CBC WITH DIFFERENTIAL      Status: Abnormal   Collection Time    01/30/13  1:54 AM      Result Value Range   WBC 14.7 (*) 4.0 - 10.5 K/uL   RBC 4.43  4.22 - 5.81 MIL/uL   Hemoglobin 14.0  13.0 - 17.0 g/dL   HCT 57.8  46.9 - 62.9 %   MCV 88.7  78.0 - 100.0 fL   MCH 31.6  26.0 - 34.0 pg   MCHC 35.6  30.0 - 36.0 g/dL   RDW 52.8  41.3 - 24.4 %   Platelets 157  150 - 400 K/uL   Neutrophils Relative 84 (*) 43 - 77 %   Neutro Abs 12.4 (*) 1.7 - 7.7 K/uL   Lymphocytes Relative 10 (*) 12 - 46 %   Lymphs Abs  1.4  0.7 - 4.0 K/uL   Monocytes Relative 6  3 - 12 %   Monocytes Absolute 0.9  0.1 - 1.0 K/uL   Eosinophils Relative 0  0 - 5 %   Eosinophils Absolute 0.0  0.0 - 0.7 K/uL   Basophils Relative 0  0 - 1 %   Basophils Absolute 0.0  0.0 - 0.1 K/uL  LIPASE, BLOOD     Status: None   Collection Time    01/30/13  1:54 AM      Result Value Range   Lipase 40  11 - 59 U/L  ETHANOL     Status: Abnormal   Collection Time    01/30/13  1:54 AM      Result Value Range   Alcohol, Ethyl (B) 68 (*) 0 - 11 mg/dL   Comment:            LOWEST DETECTABLE LIMIT FOR     SERUM ALCOHOL IS 11 mg/dL     FOR MEDICAL PURPOSES ONLY  LACTIC ACID, PLASMA     Status: Abnormal   Collection Time    01/30/13  1:55 AM      Result Value Range   Lactic Acid, Venous 3.9 (*) 0.5 - 2.2 mmol/L  URINALYSIS, ROUTINE W REFLEX MICROSCOPIC     Status: Abnormal   Collection Time    01/30/13  3:45 AM      Result Value Range   Color, Urine YELLOW  YELLOW   APPearance CLEAR  CLEAR   Specific Gravity, Urine 1.020  1.005 - 1.030   pH 7.0  5.0 - 8.0   Glucose, UA NEGATIVE  NEGATIVE mg/dL   Hgb urine dipstick TRACE (*) NEGATIVE   Bilirubin Urine NEGATIVE  NEGATIVE   Ketones, ur NEGATIVE  NEGATIVE mg/dL   Protein, ur NEGATIVE  NEGATIVE mg/dL   Urobilinogen, UA 0.2  0.0 - 1.0 mg/dL   Nitrite NEGATIVE  NEGATIVE   Leukocytes, UA NEGATIVE  NEGATIVE  URINE MICROSCOPIC-ADD ON     Status: None   Collection Time    01/30/13  3:45 AM      Result Value  Range   Squamous Epithelial / LPF RARE  RARE   WBC, UA 0-2  <3 WBC/hpf   RBC / HPF 0-2  <3 RBC/hpf   Bacteria, UA RARE  RARE  LACTIC ACID, PLASMA     Status: Abnormal   Collection Time    01/30/13  4:32 AM      Result Value Range   Lactic Acid, Venous 2.4 (*) 0.5 - 2.2 mmol/L  COMPREHENSIVE METABOLIC PANEL     Status: Abnormal   Collection Time    01/31/13  6:18 AM      Result Value Range   Sodium 140  135 - 145 mEq/L   Potassium 4.0  3.5 - 5.1 mEq/L   Chloride 108  96 - 112 mEq/L   CO2 25  19 - 32 mEq/L   Glucose, Bld 114 (*) 70 - 99 mg/dL   BUN 11  6 - 23 mg/dL   Creatinine, Ser 4.09  0.50 - 1.35 mg/dL   Calcium 8.5  8.4 - 81.1 mg/dL   Total Protein 5.5 (*) 6.0 - 8.3 g/dL   Albumin 3.3 (*) 3.5 - 5.2 g/dL   AST 20  0 - 37 U/L   ALT 23  0 - 53 U/L   Alkaline Phosphatase 43  39 - 117 U/L   Total Bilirubin 1.2  0.3 -  1.2 mg/dL   GFR calc non Af Amer >90  >90 mL/min   GFR calc Af Amer >90  >90 mL/min   Comment:            The eGFR has been calculated     using the CKD EPI equation.     This calculation has not been     validated in all clinical     situations.     eGFR's persistently     <90 mL/min signify     possible Chronic Kidney Disease.  LIPASE, BLOOD     Status: None   Collection Time    01/31/13  6:18 AM      Result Value Range   Lipase 53  11 - 59 U/L  CBC WITH DIFFERENTIAL     Status: Abnormal   Collection Time    01/31/13  6:18 AM      Result Value Range   WBC 8.4  4.0 - 10.5 K/uL   RBC 4.00 (*) 4.22 - 5.81 MIL/uL   Hemoglobin 12.5 (*) 13.0 - 17.0 g/dL   HCT 16.1 (*) 09.6 - 04.5 %   MCV 90.8  78.0 - 100.0 fL   MCH 31.3  26.0 - 34.0 pg   MCHC 34.4  30.0 - 36.0 g/dL   RDW 40.9  81.1 - 91.4 %   Platelets 154  150 - 400 K/uL   Neutrophils Relative 65  43 - 77 %   Neutro Abs 5.5  1.7 - 7.7 K/uL   Lymphocytes Relative 29  12 - 46 %   Lymphs Abs 2.5  0.7 - 4.0 K/uL   Monocytes Relative 6  3 - 12 %   Monocytes Absolute 0.5  0.1 - 1.0 K/uL   Eosinophils  Relative 0  0 - 5 %   Eosinophils Absolute 0.0  0.0 - 0.7 K/uL   Basophils Relative 0  0 - 1 %   Basophils Absolute 0.0  0.0 - 0.1 K/uL    Diagnostics:  Ct Abdomen Pelvis W Contrast  01/30/2013  *RADIOLOGY REPORT*  Clinical Data: Abdominal pain, EtOH intoxication  CT ABDOMEN AND PELVIS WITH CONTRAST  Technique:  Multidetector CT imaging of the abdomen and pelvis was performed following the standard protocol during bolus administration of intravenous contrast.  Contrast: 50mL OMNIPAQUE IOHEXOL 300 MG/ML  SOLN, OMNIPAQUE IOHEXOL 300 MG/ML  SOLN  Comparison: 01/16/2010 ultrasound  Findings: Limited images through the lung bases demonstrate no significant appreciable abnormality. The heart size is within normal limits. No pleural or pericardial effusion.  Periportal edema.  Otherwise, unremarkable liver, spleen, pancreas, adrenal glands.  Mild gallbladder wall thickening.  No radiodense gallstones.  No biliary ductal dilatation.  Symmetric renal enhancement.  No hydronephrosis or hydroureter.  No CT evidence for colitis.  Normal appendix.  Small bowel loops are normal course and caliber.  No free intraperitoneal air or. There is haziness of the retro pancreatic fat and trace amount of fluid tracking inferiorly along the inferior mesenteric artery (series 2 image 38).  No lymphadenopathy.  Normal caliber aorta and branch vessels.  Patent portal vasculature.  Mild bladder wall thickening is nonspecific given incomplete distension.  No acute osseous finding.  IMPRESSION: Nonspecific gallbladder wall edema.  Correlate with ultrasound if clinically warranted.  Retro pancreatic fat stranding may reflect acute or sequelae of prior pancreatitis.  No CT evidence for necrosis, abscess, or pseudocyst.  Trace fluid tracking inferiorly along the inferior mesenteric artery may relate to pancreatic pathology  or a nonspecific enteritis (infectious, inflammatory, or ischemic considerations). The bowel loops themselves show  no overt pathologic findings such as wall thickening or wall pneumatosis.   Original Report Authenticated By: Jearld Lesch, M.D.    Full Code   Hospital Course: See H&P for complete admission details. The patient is a 42 year old healthy white male who presented to the emergency room with intractable vomiting and epigastric pain after doing shots of liquor. His wife reports that he rarely drinks, but that she had thrown him a surprise birthday party. In the emergency room, his pain and vomiting was difficult to control. Blood work was significant for a lactic acidosis and leukocytosis. CAT scan of the abdomen and pelvis showed nonspecific changes as detailed above. The patient was placed on observation. He was given IV fluids, antibiotics. After arriving on the floor, he did not require much pain medication but continued to have vomiting the day of admission. This has since resolved and he is currently eating well. His abdominal pain has resolved, and his leukocytosis has resolved. He likely had alcohol-related gastritis, and I've given him a prescription for omeprazole to take should his abdominal pain continued to be an issue.  Discharge Exam:  Blood pressure 102/58, pulse 50, temperature 97.8 F (36.6 C), temperature source Oral, resp. rate 17, height 5\' 7"  (1.702 m), weight 70.308 kg (155 lb), SpO2 100.00%.  General: Alert. Comfortable. Oriented. Eating his breakfast tray voratiously Lungs clear to auscultation bilaterally without wheezes rhonchi or rales Cardiovascular regular rate rhythm without murmurs gallops rubs Abdomen soft nontender Ext No CCE  Signed: Garik Diamant L 01/31/2013, 8:05 AM

## 2013-01-31 NOTE — Progress Notes (Signed)
Pt's discharge instructions were reviewed. Pt verbalized understanding. Pt discharged to home, with wife, in a safe and stable condition.

## 2013-07-18 ENCOUNTER — Emergency Department (HOSPITAL_COMMUNITY): Payer: 59

## 2013-07-18 ENCOUNTER — Observation Stay (HOSPITAL_COMMUNITY)
Admission: EM | Admit: 2013-07-18 | Discharge: 2013-07-19 | Disposition: A | Discharge status: Home / Self Care | Payer: 59 | Attending: Family Medicine | Admitting: Family Medicine

## 2013-07-18 ENCOUNTER — Encounter (HOSPITAL_COMMUNITY): Payer: Self-pay

## 2013-07-18 DIAGNOSIS — Z720 Tobacco use: Secondary | ICD-10-CM | POA: Diagnosis present

## 2013-07-18 DIAGNOSIS — R748 Abnormal levels of other serum enzymes: Secondary | ICD-10-CM | POA: Insufficient documentation

## 2013-07-18 DIAGNOSIS — I498 Other specified cardiac arrhythmias: Secondary | ICD-10-CM | POA: Insufficient documentation

## 2013-07-18 DIAGNOSIS — R1115 Cyclical vomiting syndrome unrelated to migraine: Principal | ICD-10-CM | POA: Insufficient documentation

## 2013-07-18 DIAGNOSIS — R197 Diarrhea, unspecified: Secondary | ICD-10-CM | POA: Insufficient documentation

## 2013-07-18 DIAGNOSIS — K5289 Other specified noninfective gastroenteritis and colitis: Secondary | ICD-10-CM

## 2013-07-18 DIAGNOSIS — F1210 Cannabis abuse, uncomplicated: Secondary | ICD-10-CM | POA: Insufficient documentation

## 2013-07-18 DIAGNOSIS — R1110 Vomiting, unspecified: Secondary | ICD-10-CM

## 2013-07-18 DIAGNOSIS — Z9089 Acquired absence of other organs: Secondary | ICD-10-CM | POA: Insufficient documentation

## 2013-07-18 DIAGNOSIS — E860 Dehydration: Secondary | ICD-10-CM | POA: Insufficient documentation

## 2013-07-18 DIAGNOSIS — R1013 Epigastric pain: Secondary | ICD-10-CM | POA: Insufficient documentation

## 2013-07-18 DIAGNOSIS — F17200 Nicotine dependence, unspecified, uncomplicated: Secondary | ICD-10-CM

## 2013-07-18 DIAGNOSIS — K529 Noninfective gastroenteritis and colitis, unspecified: Secondary | ICD-10-CM | POA: Diagnosis present

## 2013-07-18 DIAGNOSIS — R109 Unspecified abdominal pain: Secondary | ICD-10-CM | POA: Diagnosis present

## 2013-07-18 DIAGNOSIS — F17210 Nicotine dependence, cigarettes, uncomplicated: Secondary | ICD-10-CM

## 2013-07-18 HISTORY — DX: Acute pancreatitis without necrosis or infection, unspecified: K85.90

## 2013-07-18 LAB — CBC WITH DIFFERENTIAL/PLATELET
Basophils Absolute: 0 10*3/uL (ref 0.0–0.1)
Basophils Relative: 0 % (ref 0–1)
Eosinophils Absolute: 0.1 10*3/uL (ref 0.0–0.7)
Eosinophils Relative: 0 % (ref 0–5)
HCT: 42.8 % (ref 39.0–52.0)
Hemoglobin: 15.4 g/dL (ref 13.0–17.0)
Lymphocytes Relative: 15 % (ref 12–46)
Lymphs Abs: 2.5 10*3/uL (ref 0.7–4.0)
MCH: 31.9 pg (ref 26.0–34.0)
MCHC: 36 g/dL (ref 30.0–36.0)
MCV: 88.6 fL (ref 78.0–100.0)
Monocytes Absolute: 0.9 10*3/uL (ref 0.1–1.0)
Monocytes Relative: 6 % (ref 3–12)
Neutro Abs: 13 10*3/uL — ABNORMAL HIGH (ref 1.7–7.7)
Neutrophils Relative %: 79 % — ABNORMAL HIGH (ref 43–77)
Platelets: 186 10*3/uL (ref 150–400)
RBC: 4.83 MIL/uL (ref 4.22–5.81)
RDW: 12.2 % (ref 11.5–15.5)
WBC: 16.5 10*3/uL — ABNORMAL HIGH (ref 4.0–10.5)

## 2013-07-18 LAB — COMPREHENSIVE METABOLIC PANEL
ALT: 14 U/L (ref 0–53)
AST: 19 U/L (ref 0–37)
Albumin: 4 g/dL (ref 3.5–5.2)
Alkaline Phosphatase: 63 U/L (ref 39–117)
BUN: 12 mg/dL (ref 6–23)
CO2: 20 mEq/L (ref 19–32)
Calcium: 9.7 mg/dL (ref 8.4–10.5)
Chloride: 102 mEq/L (ref 96–112)
Creatinine, Ser: 0.84 mg/dL (ref 0.50–1.35)
GFR calc Af Amer: >90 mL/min (ref >90)
GFR calc non Af Amer: >90 mL/min (ref >90)
Glucose, Bld: 188 mg/dL — ABNORMAL HIGH (ref 70–99)
Potassium: 3.5 mEq/L (ref 3.5–5.1)
Sodium: 138 mEq/L (ref 135–145)
Total Bilirubin: 0.9 mg/dL (ref 0.3–1.2)
Total Protein: 7.1 g/dL (ref 6.0–8.3)

## 2013-07-18 LAB — TROPONIN I: Troponin I: <0.3 ng/mL (ref <0.30)

## 2013-07-18 LAB — LIPASE, BLOOD: Lipase: 65 U/L — ABNORMAL HIGH (ref 11–59)

## 2013-07-18 IMAGING — CT CT ABD-PELV W/ CM
2 of 3 series · 16 of 46 positions shown, 18 images · IV contrast (Omnipaque 300)
Comparison: CT [DATE]

CLINICAL DATA: Severe epigastric pain and vomiting

CT ABDOMEN AND PELVIS WITH CONTRAST
TECHNIQUE: Multidetector CT imaging of the abdomen and pelvis was
performed following the standard protocol during bolus
administration of intravenous contrast.
Contrast: 50mL OMNIPAQUE IOHEXOL 300 MG/ML  SOLN, 100mL OMNIPAQUE
IOHEXOL 300 MG/ML  SOLN

[Series 2: abd_pel_with 5.0 b40f · axial · 0.59mm/px · z∈[-537,-147]mm · 13 of 90 slices shown, 15 images]
[im 6/90  soft-tissue]
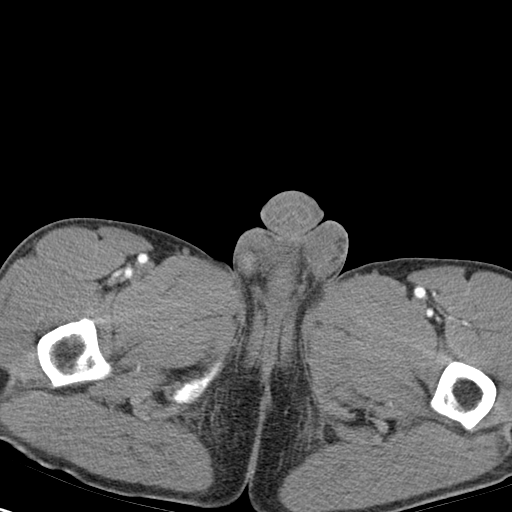
[im 6/90  bone]
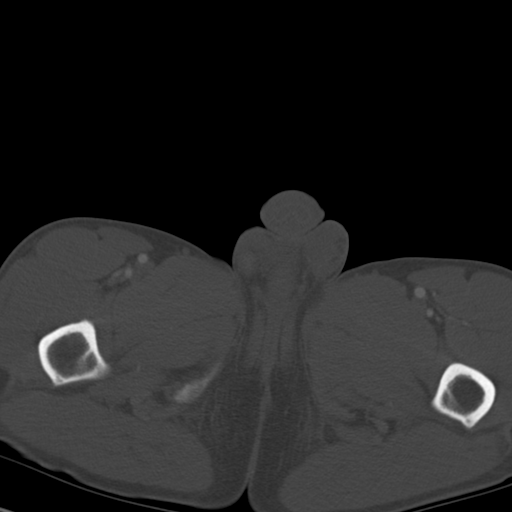
[im 12/90  soft-tissue]
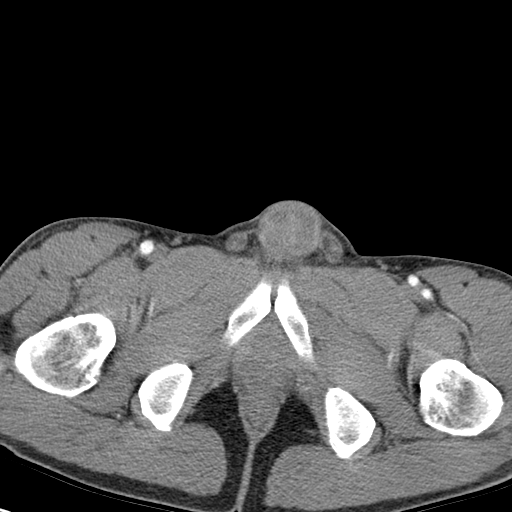
[im 18/90  soft-tissue]
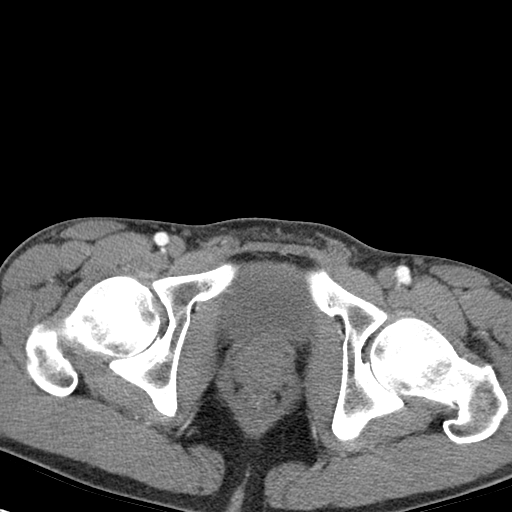
[im 26/90  soft-tissue]
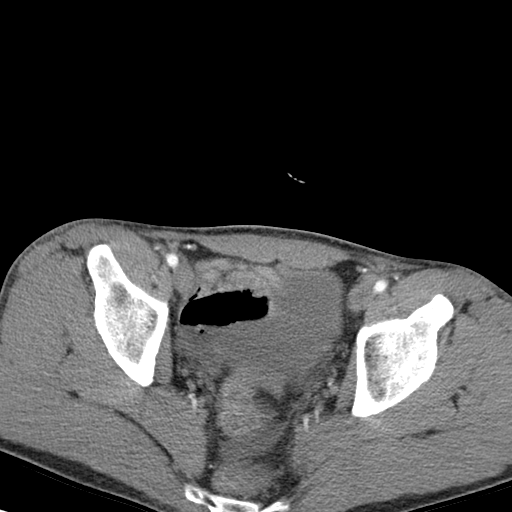
[im 32/90  soft-tissue]
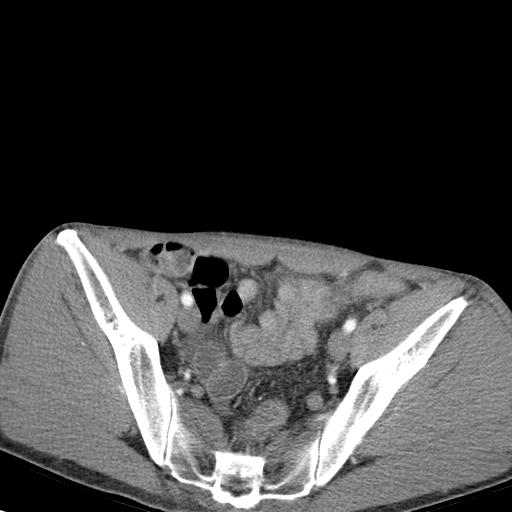
[im 38/90  soft-tissue]
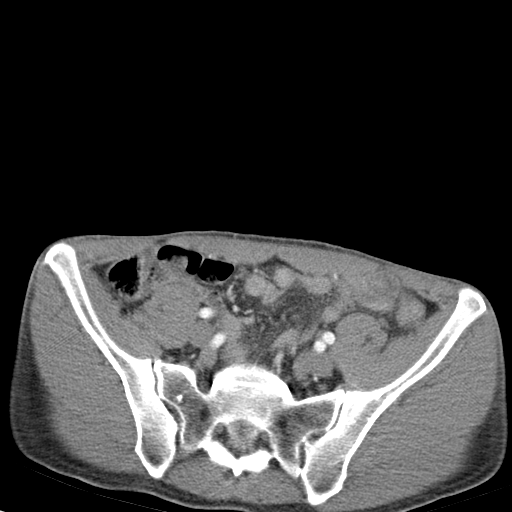
[im 46/90  soft-tissue]
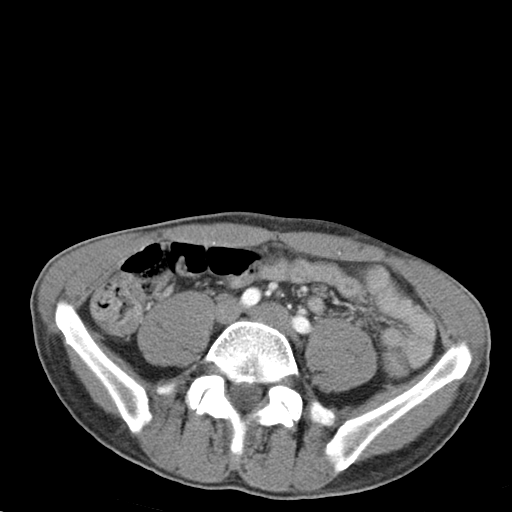
[im 52/90  soft-tissue]
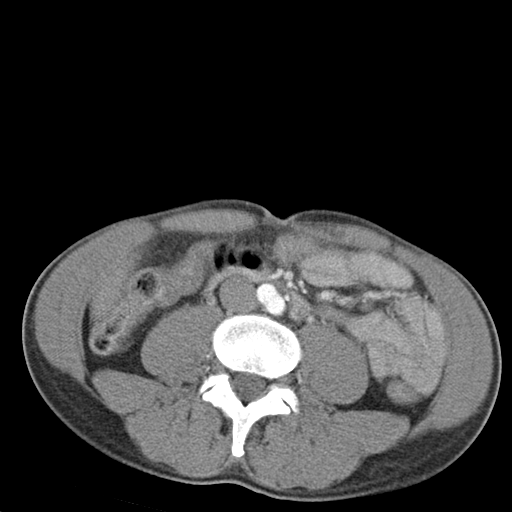
[im 58/90  soft-tissue]
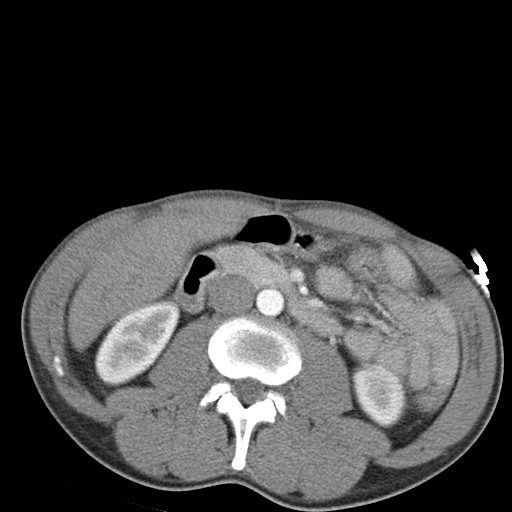
[im 58/90  bone]
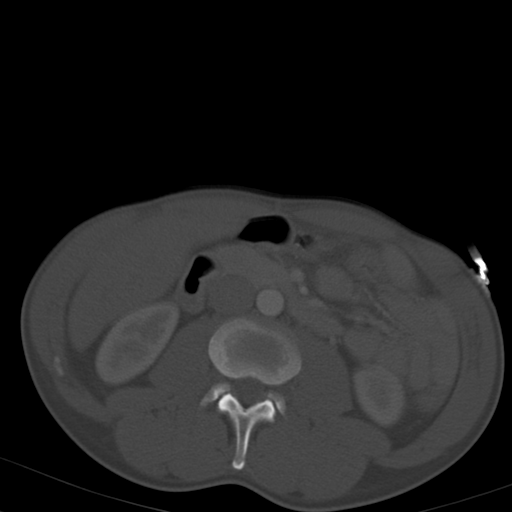
[im 64/90  soft-tissue]
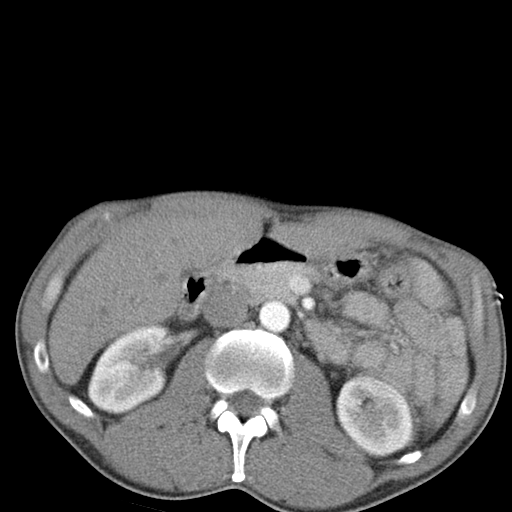
[im 72/90  soft-tissue]
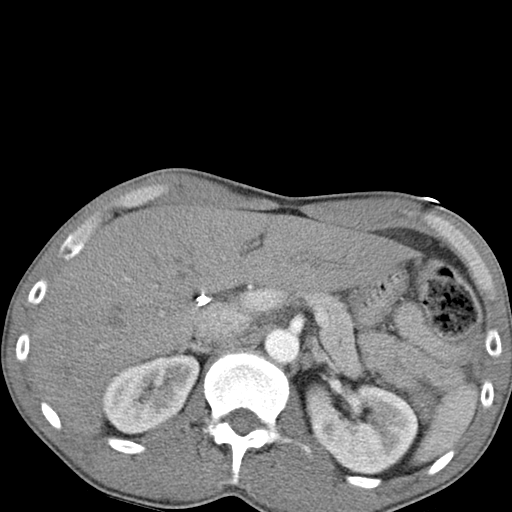
[im 78/90  soft-tissue]
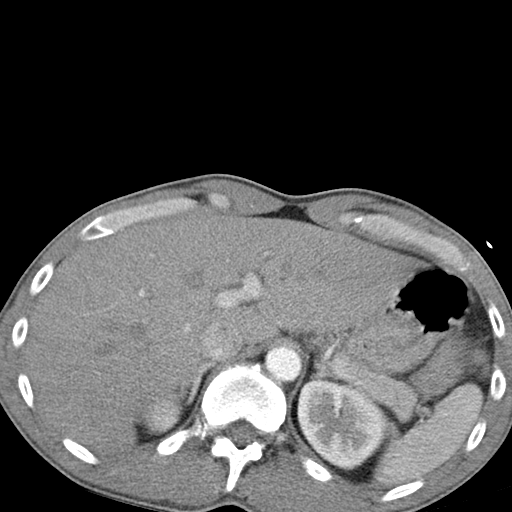
[im 84/90  soft-tissue]
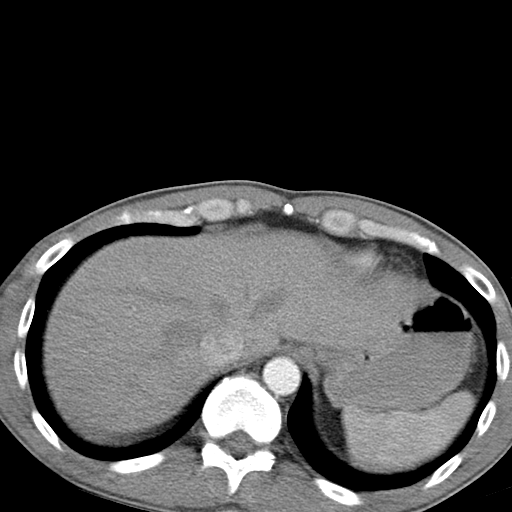

[Series 4: abd_pel_with 3.0 spo · coronal · 0.64mm/px · 3 of 62 slices shown]
[im 21/62  soft-tissue]
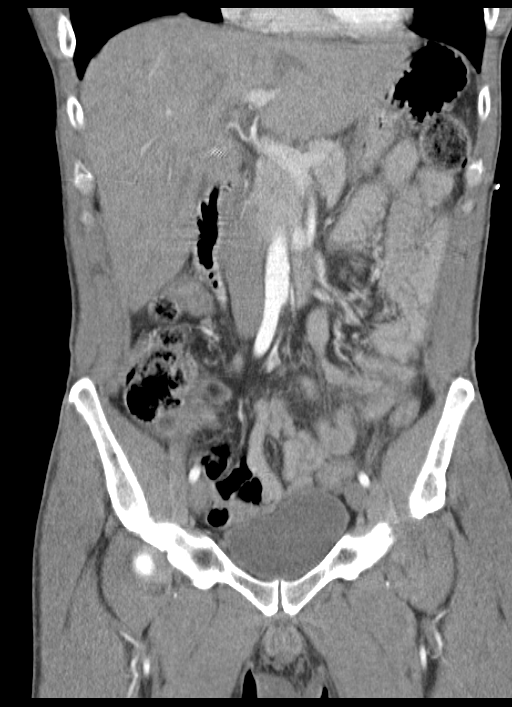
[im 28/62  soft-tissue]
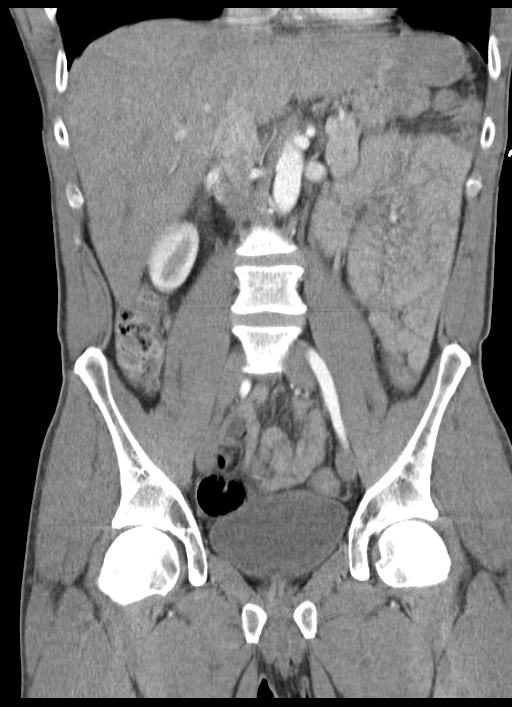
[im 34/62  soft-tissue]
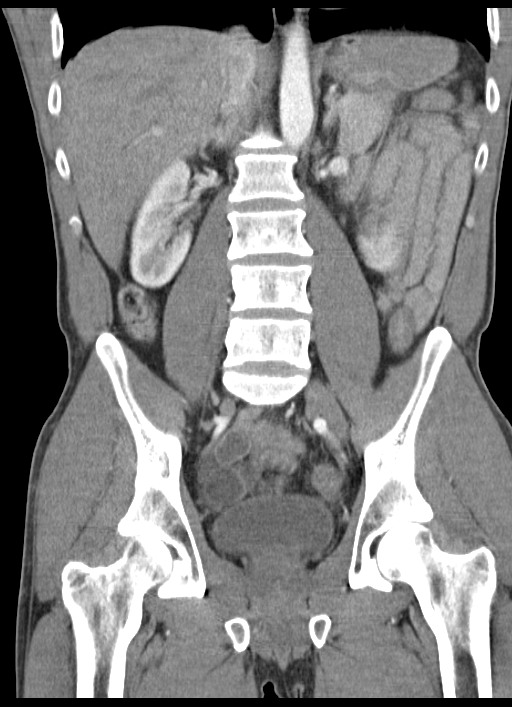

[16 of 46 positions shown; findings below may reference images not displayed]

FINDINGS: Lung bases are clear.  No pericardial fluid.

No focal hepatic lesion.  Interval resolution of biliary duct
dilatation seen on comparison exam.  Patient status post
cholecystectomy.  Pancreas, spleen, adrenal glands, and kidneys are
normal. No peri pancreatic inflammation or fluid collection suggest
pancreatitis.

The stomach, small bowel cecum normal.  Appendix is normal.  The
colon and rectosigmoid colon are normal.

Abdominal aorta is normal caliber.  No retroperitoneal periportal
lymphadenopathy.

No free fluid pelvis.  No pelvic lymphadenopathy.
IMPRESSION: 1..  No acute abdominal or pelvic findings.

2.   Status post cholecystectomy and resolution of biliary duct
dilatation.
3.  No radiographic evidence of acute pancreatitis.

## 2013-07-18 MED ORDER — PROMETHAZINE HCL 25 MG/ML IJ SOLN
12.5000 mg | Freq: Once | INTRAMUSCULAR | Status: AC
  Administered 2013-07-18: 12.5 mg via INTRAVENOUS
  Filled 2013-07-18: qty 1

## 2013-07-18 MED ORDER — IOHEXOL 300 MG/ML  SOLN
100.0000 mL | Freq: Once | INTRAMUSCULAR | Status: AC | PRN
  Administered 2013-07-18: 100 mL via INTRAVENOUS

## 2013-07-18 MED ORDER — SODIUM CHLORIDE 0.9 % IV SOLN
INTRAVENOUS | Status: DC
  Administered 2013-07-18: 22:00:00 via INTRAVENOUS
  Filled 2013-07-18 (×3): qty 1000

## 2013-07-18 MED ORDER — HYDROMORPHONE HCL PF 1 MG/ML IJ SOLN: 1.0000 mg | INTRAMUSCULAR | Status: AC | PRN

## 2013-07-18 MED ORDER — ENOXAPARIN SODIUM 40 MG/0.4ML ~~LOC~~ SOLN
40.0000 mg | SUBCUTANEOUS | Status: DC
  Administered 2013-07-18: 40 mg via SUBCUTANEOUS
  Filled 2013-07-18 (×2): qty 0.4

## 2013-07-18 MED ORDER — ONDANSETRON HCL 4 MG/2ML IJ SOLN
INTRAMUSCULAR | Status: AC
  Filled 2013-07-18: qty 2

## 2013-07-18 MED ORDER — SODIUM CHLORIDE 0.9 % IV SOLN: INTRAVENOUS | Status: DC

## 2013-07-18 MED ORDER — MORPHINE SULFATE 4 MG/ML IJ SOLN
4.0000 mg | Freq: Once | INTRAMUSCULAR | Status: AC
  Administered 2013-07-18: 4 mg via INTRAVENOUS
  Filled 2013-07-18: qty 1

## 2013-07-18 MED ORDER — ONDANSETRON HCL 4 MG/2ML IJ SOLN: 4.0000 mg | Freq: Three times a day (TID) | INTRAMUSCULAR | Status: DC | PRN

## 2013-07-18 MED ORDER — ONDANSETRON HCL 4 MG/2ML IJ SOLN
4.0000 mg | INTRAMUSCULAR | Status: DC | PRN
  Administered 2013-07-18: 4 mg via INTRAVENOUS
  Filled 2013-07-18: qty 2

## 2013-07-18 MED ORDER — ONDANSETRON HCL 4 MG/2ML IJ SOLN
4.0000 mg | Freq: Once | INTRAMUSCULAR | Status: AC
  Administered 2013-07-18: 4 mg via INTRAVENOUS

## 2013-07-18 MED ORDER — TRAZODONE HCL 50 MG PO TABS: 50.0000 mg | ORAL_TABLET | Freq: Every evening | ORAL | Status: DC | PRN

## 2013-07-18 MED ORDER — SODIUM CHLORIDE 0.9 % IV BOLUS (SEPSIS)
1000.0000 mL | Freq: Once | INTRAVENOUS | Status: AC
  Administered 2013-07-18: 1000 mL via INTRAVENOUS

## 2013-07-18 MED ORDER — ACETAMINOPHEN 325 MG PO TABS
650.0000 mg | ORAL_TABLET | ORAL | Status: DC | PRN
  Administered 2013-07-19 (×2): 650 mg via ORAL
  Filled 2013-07-18 (×2): qty 2

## 2013-07-18 MED ORDER — IOHEXOL 300 MG/ML  SOLN
50.0000 mL | Freq: Once | INTRAMUSCULAR | Status: AC | PRN
  Administered 2013-07-18: 50 mL via ORAL

## 2013-07-18 NOTE — ED Notes (Signed)
Pt woke from sleep at 2:30 today with nausea, vomiting and diarrhea. No fever.

## 2013-07-18 NOTE — H&P (Signed)
[facility] History and Physical  Troy Leon  ZOX:[id number]  DOB: 12-04-1970   DOA: 07/18/2013   PCP:   Lilyan Punt, MD   Chief Complaint:  Vomiting and diarrhea since today  HPI: Troy Leon is a 42 y.o. male.   Caucasian gentleman presenting with the above complaint; sudden onset,  no sick contacts,  his wife who accompanies him to the ER is not having similar symptoms   about 15 episodes of bilious vomiting, no blood; 3-4 loose watery stool associated with severe colic abdominal pain.  He does take good his powders episodically for headaches and did take a dose yesterday In the emergency room patient was gray and very sick looking and the hospitalist service called for observation admission   Smokes one pack per  Rewiew of Systems:   All systems negative except as marked bold or noted in the HPI;  Constitutional:    malaise, fever and chills. ;  Eyes:   eye pain, redness and discharge. ;  ENMT:   ear pain, hoarseness, nasal congestion, sinus pressure and sore throat. ;  Cardiovascular:    chest pain, palpitations, diaphoresis, dyspnea and peripheral edema.  Respiratory:   cough, hemoptysis, wheezing and stridor. ;  Gastrointestinal: constipation, melena, blood in stool, hematemesis, jaundice and rectal bleeding. unusual weight loss..   Genitourinary:    frequency, dysuria, incontinence,flank pain and hematuria; Musculoskeletal:   back pain and neck pain.  swelling and trauma.;  Skin: .  pruritus, rash, abrasions, bruising and skin lesion.; ulcerations Neuro:    headache, lightheadedness and neck stiffness.  weakness, altered level of consciousness, altered mental status, extremity weakness, burning feet, involuntary movement, seizure and syncope.  Psych:    anxiety, depression, insomnia, tearfulness, panic attacks, hallucinations, paranoia, suicidal or homicidal ideation    Past Medical History  Diagnosis Date  . Pancreatitis     Past Surgical History   Procedure Laterality Date  . Cholecystectomy      Medications:  HOME MEDS: Prior to Admission medications   Medication Sig Start Date End Date Taking? Authorizing Provider  Aspirin-Acetaminophen-Caffeine (GOODY HEADACHE PO) Take 1-2 packets by mouth as needed (for headache).   Yes Historical Provider, MD     Allergies:  Allergies  Allergen Reactions  . Penicillins Other (See Comments)    UNKNOWN CHILDHOOD ALLERGY    Social History:   reports that he has been smoking Cigarettes.  He has been smoking about 0.00 packs per day. He does not have any smokeless tobacco history on file. He reports that  drinks alcohol. He reports that he uses illicit drugs (Marijuana).  smokes one pack per day  Family History: No family history on file.   Physical Exam: Filed Vitals:   07/18/13 1900 07/18/13 1915 07/18/13 1930 07/18/13 1945  BP: 71/51 96/51 91/44  102/49  Pulse:      Temp:      TempSrc:      Resp: 15 13 13 20   SpO2:       Blood pressure 102/49, pulse 103, temperature 97.9 F (36.6 C), temperature source Oral, resp. rate 20, SpO2 100.00%. There is no weight on file to calculate BMI.   GEN:  Pleasant  young Caucasian gentleman lying bed in no acute distress; cooperative with exam PSYCH:  alert and oriented x4;   a little anxious ; affect is appropriate. HEENT: Mucous membranes pink try  and anicteric; PERRLA; EOM intact; no cervical lymphadenopathy nor thyromegaly or carotid bruit; no JVD; Breasts:: Not examined CHEST  WALL: No tenderness CHEST: Normal respiration, clear to auscultation bilaterally HEART: Regular rate and rhythm; no murmurs rubs or gallops BACK: No kyphosis no scoliosis; no CVA tenderness ABDOMEN: soft non-tender; no masses, no organomegaly,  no pannus; no intertriginous candida. Rectal Exam: Not done EXTREMITIES: No bone or joint deformity;  no edema; no ulcerations. Genitalia: not examined PULSES: 2+ and symmetric SKIN: Normal hydration no rash or  ulceration CNS: Cranial nerves 2-12 grossly intact no focal lateralizing neurologic deficit   Labs on Admission:  Basic Metabolic Panel:  Recent Labs Lab 07/18/13 1745  NA 138  K 3.5  CL 102  CO2 20  GLUCOSE 188*  BUN 12  CREATININE 0.84  CALCIUM 9.7   Liver Function Tests:  Recent Labs Lab 07/18/13 1745  AST 19  ALT 14  ALKPHOS 63  BILITOT 0.9  PROT 7.1  ALBUMIN 4.0    Recent Labs Lab 07/18/13 1745  LIPASE 65*   No results found for this basename: AMMONIA,  in the last 168 hours CBC:  Recent Labs Lab 07/18/13 1745  WBC 16.5*  NEUTROABS 13.0*  HGB 15.4  HCT 42.8  MCV 88.6  PLT 186   Cardiac Enzymes:  Recent Labs Lab 07/18/13 1745  TROPONINI <0.30   BNP: No components found with this basename: POCBNP,  D-dimer: No components found with this basename: D-DIMER,  CBG: No results found for this basename: GLUCAP,  in the last 168 hours  Radiological Exams on Admission: Ct Abdomen Pelvis W Contrast  07/18/2013   *RADIOLOGY REPORT*  Clinical Data: Severe epigastric pain and vomiting  CT ABDOMEN AND PELVIS WITH CONTRAST  Technique:  Multidetector CT imaging of the abdomen and pelvis was performed following the standard protocol during bolus administration of intravenous contrast.  Contrast: 50mL OMNIPAQUE IOHEXOL 300 MG/ML  SOLN, OMNIPAQUE IOHEXOL 300 MG/ML  SOLN  Comparison: CT 02/15/2013  Findings: Lung bases are clear.  No pericardial fluid.  No focal hepatic lesion.  Interval resolution of biliary duct dilatation seen on comparison exam.  Patient status post cholecystectomy.  Pancreas, spleen, adrenal glands, and kidneys are normal. No peri pancreatic inflammation or fluid collection suggest pancreatitis.  The stomach, small bowel cecum normal.  Appendix is normal.  The colon and rectosigmoid colon are normal.  Abdominal aorta is normal caliber.  No retroperitoneal periportal lymphadenopathy.  No free fluid pelvis.  No pelvic lymphadenopathy.   IMPRESSION:  1.  No acute abdominal or pelvic findings.  2.   Status post cholecystectomy and resolution of biliary duct dilatation. 3.  No radiographic evidence of acute pancreatitis.   Original Report Authenticated By: Genevive Bi, M.D.       Assessment/Plan   Active Problems:   Abdominal pain   Gastroenteritis, acute   Dehydration   Tobacco abuse   PLAN:  admit for  IV fluids   clear liquid diet advance as tolerated   nicotine cessation counseling and nicotine replacement  Other plans as per orders.  Code Status:  full Family Communication:  Plans discuss with patient and  wife at bedside Disposition Plan:  the likely home in the morning     Fletcher Rathbun Nocturnist [facility] Pager [phone]   07/18/2013, 8:44 PM

## 2013-07-18 NOTE — ED Provider Notes (Signed)
CSN: [id number]     Arrival date & time 07/18/13  1658 History   First MD Initiated Contact with Patient 07/18/13 1704     Chief Complaint  Patient presents with  . Nausea  . Emesis  . Diarrhea   (Consider location/radiation/quality/duration/timing/severity/associated sxs/prior Treatment) HPI Comments: Patient with past medical history significant for cholecystectomy and pancreatitis. He will this morning at about 2:30 with complaints of severe nausea, vomiting, diarrhea and upper abdominal pain. He has been unable to keep anything down and nothing seems to help. He has had pancreatitis in the past and states that this feels similar.  Patient is a 42 y.o. male presenting with vomiting. The history is provided by the patient.  Emesis Severity:  Severe Duration:  12 hours Timing:  Constant Quality:  Bilious material Progression:  Worsening Chronicity:  New Recent urination:  Decreased Relieved by:  Nothing Worsened by:  Nothing tried Ineffective treatments:  None tried   Past Medical History  Diagnosis Date  . Pancreatitis    Past Surgical History  Procedure Laterality Date  . Cholecystectomy     No family history on file. History  Substance Use Topics  . Smoking status: Current Every Day Smoker    Types: Cigarettes  . Smokeless tobacco: Not on file  . Alcohol Use: Yes    Review of Systems  Gastrointestinal: Positive for vomiting.  All other systems reviewed and are negative.    Allergies  Penicillins  Home Medications   Current Outpatient Rx  Name  Route  Sig  Dispense  Refill  . Aspirin-Acetaminophen-Caffeine (GOODY HEADACHE PO)   Oral   Take 1-2 packets by mouth as needed (for headache).          There were no vitals taken for this visit. Physical Exam  Nursing note and vitals reviewed. Constitutional: He is oriented to person, place, and time. He appears distressed.  The patient appears quite anxious and pale.  HENT:  Head: Normocephalic and  atraumatic.  Mouth/Throat: Oropharynx is clear and moist.  Neck: Normal range of motion. Neck supple.  Cardiovascular: Normal rate, regular rhythm and normal heart sounds.   No murmur heard. Pulmonary/Chest: Effort normal and breath sounds normal. No respiratory distress. He has no wheezes.  Abdominal: Soft. Bowel sounds are normal. He exhibits no distension and no mass. There is tenderness. There is no rebound.  There is tenderness to palpation in the epigastrium. There is voluntary guarding but no rebound.  Musculoskeletal: Normal range of motion. He exhibits no edema.  Neurological: He is alert and oriented to person, place, and time.  Skin: Skin is warm. He is diaphoretic. There is pallor.    ED Course  Procedures (including critical care time) Labs Review Labs Reviewed  CBC WITH DIFFERENTIAL  COMPREHENSIVE METABOLIC PANEL  LIPASE, BLOOD  TROPONIN I   Imaging Review No results found.   Date: 07/18/2013  Rate: 56  Rhythm: sinus bradycardia  QRS Axis: normal  Intervals: normal  ST/T Wave abnormalities: normal  Conduction Disutrbances:none  Narrative Interpretation:   Old EKG Reviewed: none available    MDM  No diagnosis found. Patient presents here with complaints of severe abdominal pain, nausea, and vomiting that started early this morning. He appears extremely pale and uncomfortable. He was given repeat doses of pain and nausea medication and is feeling slightly better. He does continue to vomit. Workup reveals a mildly elevated lipase a CT scan that does not show any acute intra-abdominal pathology. His electrolytes are significant only  for a glucose of 188. I have spoken with Dr. Orvan Falconer who agrees to admit the patient for observation and hydration.      Geoffery Lyons, MD 07/18/13 1945

## 2013-07-19 DIAGNOSIS — R109 Unspecified abdominal pain: Secondary | ICD-10-CM

## 2013-07-19 LAB — BASIC METABOLIC PANEL
BUN: 9 mg/dL (ref 6–23)
CO2: 25 mEq/L (ref 19–32)
Calcium: 8.6 mg/dL (ref 8.4–10.5)
Chloride: 107 mEq/L (ref 96–112)
Creatinine, Ser: 0.89 mg/dL (ref 0.50–1.35)
GFR calc Af Amer: >90 mL/min (ref >90)
GFR calc non Af Amer: >90 mL/min (ref >90)
Glucose, Bld: 118 mg/dL — ABNORMAL HIGH (ref 70–99)
Potassium: 4 mEq/L (ref 3.5–5.1)
Sodium: 139 mEq/L (ref 135–145)

## 2013-07-19 LAB — URINALYSIS, ROUTINE W REFLEX MICROSCOPIC
Bilirubin Urine: NEGATIVE
Glucose, UA: NEGATIVE mg/dL
Hgb urine dipstick: NEGATIVE
Ketones, ur: NEGATIVE mg/dL
Leukocytes, UA: NEGATIVE
Nitrite: NEGATIVE
Protein, ur: NEGATIVE mg/dL
Specific Gravity, Urine: <1.005 — ABNORMAL LOW (ref 1.005–1.030)
Urobilinogen, UA: 1 mg/dL (ref 0.0–1.0)
pH: 6.5 (ref 5.0–8.0)

## 2013-07-19 LAB — CBC
HCT: 35.5 % — ABNORMAL LOW (ref 39.0–52.0)
Hemoglobin: 12.4 g/dL — ABNORMAL LOW (ref 13.0–17.0)
MCH: 31.5 pg (ref 26.0–34.0)
MCHC: 34.9 g/dL (ref 30.0–36.0)
MCV: 90.1 fL (ref 78.0–100.0)
Platelets: 155 10*3/uL (ref 150–400)
RBC: 3.94 MIL/uL — ABNORMAL LOW (ref 4.22–5.81)
RDW: 12.6 % (ref 11.5–15.5)
WBC: 8.6 10*3/uL (ref 4.0–10.5)

## 2013-07-19 MED ORDER — TRAMADOL HCL 50 MG PO TABS
50.0000 mg | ORAL_TABLET | Freq: Four times a day (QID) | ORAL | Status: DC | PRN
  Administered 2013-07-19: 50 mg via ORAL
  Filled 2013-07-19: qty 1

## 2013-07-19 MED ORDER — POTASSIUM CHLORIDE 2 MEQ/ML IV SOLN
INTRAVENOUS | Status: DC
  Filled 2013-07-19 (×2): qty 1000

## 2013-07-19 MED ORDER — POTASSIUM CHLORIDE IN NACL 20-0.9 MEQ/L-% IV SOLN
INTRAVENOUS | Status: DC
  Administered 2013-07-19 (×2): via INTRAVENOUS

## 2013-07-19 NOTE — Discharge Summary (Signed)
Physician Discharge Summary  Troy Leon ZOX:[id number] DOB: June 18, 1971 DOA: 07/18/2013  PCP: Lilyan Punt, MD  Admit date: 07/18/2013 Discharge date: 07/19/2013  Recommendations for Outpatient Follow-up:  1. Followup hospitalization, no specific recommendations  Follow-up Information   Follow up with LUKING,SCOTT, MD In 2 weeks.   Specialty:  Family Medicine   Contact information:   [address] [place] [address] [phone]      Discharge Diagnoses:  1. Nausea, vomiting, diarrhea without fever 2. Presumed infectious gastroenteritis, viral 3. Mild elevation of lipase 4. Cigarette smoker  Discharge Condition: Improved Disposition: Home  Diet recommendation: Regular  Filed Weights   07/18/13 2100  Weight: 63.7 kg (140 lb 6.9 oz)    History of present illness:  42 year old man with history of pancreatitis presented to the emergency department with sudden onset of bilious vomiting, upper abdominal pain and diarrhea. No blood. Admitted for further evaluation, clinical impression at that time acute gastroenteritis, abdominal pain.  Hospital Course:  Mr. Vallecillo was admitted for observation. Nausea, vomiting and abdominal pain resolved. He tolerated a diet and feels quite well. He has multiple sick contacts with similar symptoms. Presumed viral gastritis. Stable for discharge.  1. Nausea, vomiting, diarrhea without fever: Resolved. Multiple sick contacts. Presumed infectious gastroenteritis. Denies history of alcohol use. Very occasional NSAID use. 2. Upper abdominal pain: Resolved. Likely etiology as above. 3. Elevated lipase: Clinical significance questionable. Completely asymptomatic. No further evaluation suggested. 4. Occasional NSAID use: No signs or symptoms at this point to suggest peptic ulcer disease. 5. Admitted 01/2013 for intractable vomiting and epigastric pain after doing shots of liquor. Noted to have leukocytosis at that time. Suspected to have  alcohol-related gastritis. He denies any recurrent drinking. 6. History of cholecystectomy, pancreatitis 7. Cigarette smoker: Recommend cessation  No consultants or procedures  Discharge Instructions  Discharge Orders   Future Orders Complete By Expires   Activity as tolerated - No restrictions  As directed    Diet general  As directed    Discharge instructions  As directed    Comments:     Call physician or seek immediate medical attention for recurrent vomiting, abdominal pain or worsening of condition.       Medication List    STOP taking these medications       GOODY HEADACHE PO       Allergies  Allergen Reactions  . Penicillins Other (See Comments)    UNKNOWN CHILDHOOD ALLERGY    The results of significant diagnostics from this hospitalization (including imaging, microbiology, ancillary and laboratory) are listed below for reference.    Significant Diagnostic Studies: Ct Abdomen Pelvis W Contrast  07/18/2013   *RADIOLOGY REPORT*  Clinical Data: Severe epigastric pain and vomiting  CT ABDOMEN AND PELVIS WITH CONTRAST  Technique:  Multidetector CT imaging of the abdomen and pelvis was performed following the standard protocol during bolus administration of intravenous contrast.  Contrast: 50mL OMNIPAQUE IOHEXOL 300 MG/ML  SOLN, OMNIPAQUE IOHEXOL 300 MG/ML  SOLN  Comparison: CT 02/15/2013  Findings: Lung bases are clear.  No pericardial fluid.  No focal hepatic lesion.  Interval resolution of biliary duct dilatation seen on comparison exam.  Patient status post cholecystectomy.  Pancreas, spleen, adrenal glands, and kidneys are normal. No peri pancreatic inflammation or fluid collection suggest pancreatitis.  The stomach, small bowel cecum normal.  Appendix is normal.  The colon and rectosigmoid colon are normal.  Abdominal aorta is normal caliber.  No retroperitoneal periportal lymphadenopathy.  No free  fluid pelvis.  No pelvic lymphadenopathy.  IMPRESSION:  1.  No acute  abdominal or pelvic findings.  2.   Status post cholecystectomy and resolution of biliary duct dilatation. 3.  No radiographic evidence of acute pancreatitis.   Original Report Authenticated By: Genevive Bi, M.D.    Labs: Basic Metabolic Panel:  Recent Labs Lab 07/18/13 1745 07/19/13 0548  NA 138 139  K 3.5 4.0  CL 102 107  CO2 20 25  GLUCOSE 188* 118*  BUN 12 9  CREATININE 0.84 0.89  CALCIUM 9.7 8.6   Liver Function Tests:  Recent Labs Lab 07/18/13 1745  AST 19  ALT 14  ALKPHOS 63  BILITOT 0.9  PROT 7.1  ALBUMIN 4.0    Recent Labs Lab 07/18/13 1745  LIPASE 65*   CBC:  Recent Labs Lab 07/18/13 1745 07/19/13 0548  WBC 16.5* 8.6  NEUTROABS 13.0*  --   HGB 15.4 12.4*  HCT 42.8 35.5*  MCV 88.6 90.1  PLT 186 155   Cardiac Enzymes:  Recent Labs Lab 07/18/13 1745  TROPONINI <0.30    Active Problems:   Abdominal pain   Gastroenteritis, acute   Dehydration   Tobacco abuse   Time coordinating discharge:  25 minutes  Signed:  Brendia Sacks, MD [facility] 07/19/2013, 2:25 PM

## 2013-07-19 NOTE — Progress Notes (Signed)
[facility] PROGRESS NOTE  DIVIT STIPP BJY:[id number] DOB: [birth year] DOA: 07/18/2013 PCP: Lilyan Punt, MD  Assessment/Plan: 1. Nausea, vomiting, diarrhea without fever: Resolved. Multiple sick contacts. Presumed infectious gastroenteritis. Denies history of alcohol use. Very occasional NSAID use. 2. Upper abdominal pain: Resolved. Likely etiology as above. 3. Elevated lipase: Clinical significance questionable. Completely asymptomatic. No further evaluation suggested. 4. Occasional NSAID use: No signs or symptoms at this point to suggest peptic ulcer disease. 5. Admitted 01/2013 for intractable vomiting and epigastric pain after doing shots of liquor. Noted to have leukocytosis at that time. Suspected to have alcohol-related gastritis. He denies any recurrent drinking. 6. History of cholecystectomy, pancreatitis 7. Cigarette smoker: Recommend cessation   Advance diet. If tolerates plan discharge home later today.  Pending studies:   None  Code Status: Full code DVT prophylaxis: Lovenox Family Communication: None present Disposition Plan:   Brendia Sacks, MD  [facility]  Pager [phone] If 7PM-7AM, please contact night-coverage at [email or web address], password [facility] 07/19/2013, 9:19 AM  LOS: 1 day   Summary: 42 year old man with history of pancreatitis presented to the emergency department with sudden onset of bilious vomiting, upper abdominal pain and diarrhea. No blood. Admitted for further evaluation, clinical impression at that time acute gastroenteritis, abdominal pain.  Consultants:    Procedures:    HPI/Subjective: Feels much better. No nausea, vomiting or abdominal pain. No diarrhea. He mentions multiple sick contacts including his brother and his mother. He denies alcohol use. He reports occasional Goody powder use.  Objective: Filed Vitals:   07/18/13 1930 07/18/13 1945 07/18/13 2100 07/19/13 0657  BP: 91/44 102/49 96/59 86/49   Pulse:    47  Temp:    98.2 F (36.8 C) 98.5 F (36.9 C)  TempSrc:   Oral Oral  Resp: 13 20  20   Height:   5\' 7"  (1.702 m)   Weight:   63.7 kg (140 lb 6.9 oz)   SpO2:    98%    Intake/Output Summary (Last 24 hours) at 07/19/13 0919 Last data filed at 07/19/13 0500  Gross per 24 hour  Intake 1012.5 ml  Output      0 ml  Net 1012.5 ml     Filed Weights   07/18/13 2100  Weight: 63.7 kg (140 lb 6.9 oz)    Exam:   Afebrile, intermittently hypotensive, no tachycardia, tachypnea or hypoxia.  General: Appears calm and comfortable. Well-appearing.  Cardiovascular: Regular rate and rhythm. No murmur, rub, gallop. No lower extremity edema.  Respiratory: Clear to auscultation bilaterally. No wheezes, rales, rhonchi. Normal respiratory effort.  Abdomen: Soft, nontender, nondistended.  Psychiatric: Grossly normal mood and affect. Speech fluent and appropriate.  Data Reviewed:  Complete metabolic panel unremarkable.  Lipase 65 on admission.  Leukocytosis resolved at this morning. Hemoglobin likely at baseline at 12.4, compared to 01/31/2013.  CT of the abdomen and pelvis: No acute findings. Resolution of biliary duct dilatation. No evidence of pancreatitis.  EKG sinus bradycardia, no acute changes  Scheduled Meds: . enoxaparin (LOVENOX) injection  40 mg Subcutaneous Q24H   Continuous Infusions: . 0.9 % NaCl with KCl 20 mEq / L 150 mL/hr at 07/19/13 6962    Active Problems:   Abdominal pain   Gastroenteritis, acute   Dehydration   Tobacco abuse

## 2013-08-02 ENCOUNTER — Ambulatory Visit: Payer: Self-pay | Admitting: Family Medicine

## 2013-08-04 NOTE — Progress Notes (Signed)
UR Chart Review Completed  

## 2014-05-08 ENCOUNTER — Encounter (HOSPITAL_COMMUNITY): Payer: Self-pay | Admitting: Emergency Medicine

## 2014-05-08 ENCOUNTER — Emergency Department (HOSPITAL_COMMUNITY)
Admission: EM | Admit: 2014-05-08 | Discharge: 2014-05-08 | Disposition: A | Discharge status: Home / Self Care | Payer: BC Managed Care – PPO | Attending: Emergency Medicine | Admitting: Emergency Medicine

## 2014-05-08 DIAGNOSIS — F17200 Nicotine dependence, unspecified, uncomplicated: Secondary | ICD-10-CM | POA: Insufficient documentation

## 2014-05-08 DIAGNOSIS — R1013 Epigastric pain: Secondary | ICD-10-CM

## 2014-05-08 DIAGNOSIS — Z9089 Acquired absence of other organs: Secondary | ICD-10-CM | POA: Insufficient documentation

## 2014-05-08 DIAGNOSIS — K2970 Gastritis, unspecified, without bleeding: Secondary | ICD-10-CM | POA: Insufficient documentation

## 2014-05-08 DIAGNOSIS — Z880 Allergy status to penicillin: Secondary | ICD-10-CM | POA: Insufficient documentation

## 2014-05-08 DIAGNOSIS — IMO0002 Reserved for concepts with insufficient information to code with codable children: Secondary | ICD-10-CM | POA: Insufficient documentation

## 2014-05-08 DIAGNOSIS — K2990 Gastroduodenitis, unspecified, without bleeding: Principal | ICD-10-CM

## 2014-05-08 LAB — CBC WITH DIFFERENTIAL/PLATELET
Basophils Absolute: 0 10*3/uL (ref 0.0–0.1)
Basophils Relative: 0 % (ref 0–1)
Eosinophils Absolute: 0 10*3/uL (ref 0.0–0.7)
Eosinophils Relative: 0 % (ref 0–5)
HCT: 40.6 % (ref 39.0–52.0)
Hemoglobin: 14.8 g/dL (ref 13.0–17.0)
Lymphocytes Relative: 20 % (ref 12–46)
Lymphs Abs: 3.1 10*3/uL (ref 0.7–4.0)
MCH: 31.8 pg (ref 26.0–34.0)
MCHC: 36.5 g/dL — ABNORMAL HIGH (ref 30.0–36.0)
MCV: 87.1 fL (ref 78.0–100.0)
Monocytes Absolute: 0.8 10*3/uL (ref 0.1–1.0)
Monocytes Relative: 5 % (ref 3–12)
Neutro Abs: 11.8 10*3/uL — ABNORMAL HIGH (ref 1.7–7.7)
Neutrophils Relative %: 75 % (ref 43–77)
Platelets: 233 10*3/uL (ref 150–400)
RBC: 4.66 MIL/uL (ref 4.22–5.81)
RDW: 12.1 % (ref 11.5–15.5)
WBC: 15.7 10*3/uL — ABNORMAL HIGH (ref 4.0–10.5)

## 2014-05-08 LAB — URINALYSIS, ROUTINE W REFLEX MICROSCOPIC
Glucose, UA: NEGATIVE mg/dL
Hgb urine dipstick: NEGATIVE
Ketones, ur: 15 mg/dL — AB
Leukocytes, UA: NEGATIVE
Nitrite: NEGATIVE
Protein, ur: 30 mg/dL — AB
Specific Gravity, Urine: 1.02 (ref 1.005–1.030)
Urobilinogen, UA: 0.2 mg/dL (ref 0.0–1.0)
pH: 7 (ref 5.0–8.0)

## 2014-05-08 LAB — RAPID URINE DRUG SCREEN, HOSP PERFORMED
Amphetamines: NOT DETECTED
Barbiturates: NOT DETECTED
Benzodiazepines: NOT DETECTED
Cocaine: NOT DETECTED
Opiates: NOT DETECTED
Tetrahydrocannabinol: POSITIVE — AB

## 2014-05-08 LAB — URINE MICROSCOPIC-ADD ON

## 2014-05-08 LAB — COMPREHENSIVE METABOLIC PANEL
ALT: 21 U/L (ref 0–53)
AST: 24 U/L (ref 0–37)
Albumin: 4.3 g/dL (ref 3.5–5.2)
Alkaline Phosphatase: 67 U/L (ref 39–117)
BUN: 16 mg/dL (ref 6–23)
CO2: 22 mEq/L (ref 19–32)
Calcium: 9.6 mg/dL (ref 8.4–10.5)
Chloride: 99 mEq/L (ref 96–112)
Creatinine, Ser: 0.93 mg/dL (ref 0.50–1.35)
GFR calc Af Amer: >90 mL/min (ref >90)
GFR calc non Af Amer: >90 mL/min (ref >90)
Glucose, Bld: 148 mg/dL — ABNORMAL HIGH (ref 70–99)
Potassium: 3.5 mEq/L — ABNORMAL LOW (ref 3.7–5.3)
Sodium: 140 mEq/L (ref 137–147)
Total Bilirubin: 1 mg/dL (ref 0.3–1.2)
Total Protein: 7.5 g/dL (ref 6.0–8.3)

## 2014-05-08 LAB — LIPASE, BLOOD: Lipase: 38 U/L (ref 11–59)

## 2014-05-08 MED ORDER — ONDANSETRON HCL 4 MG/2ML IJ SOLN
4.0000 mg | Freq: Once | INTRAMUSCULAR | Status: AC
  Administered 2014-05-08: 4 mg via INTRAVENOUS
  Filled 2014-05-08: qty 2

## 2014-05-08 MED ORDER — PROMETHAZINE HCL 25 MG/ML IJ SOLN
12.5000 mg | Freq: Once | INTRAMUSCULAR | Status: AC
  Administered 2014-05-08: 12.5 mg via INTRAVENOUS
  Filled 2014-05-08: qty 1

## 2014-05-08 MED ORDER — PROMETHAZINE HCL 25 MG PO TABS: 25.0000 mg | ORAL_TABLET | Freq: Four times a day (QID) | ORAL | Status: DC | PRN

## 2014-05-08 MED ORDER — SODIUM CHLORIDE 0.9 % IV SOLN
1000.0000 mL | Freq: Once | INTRAVENOUS | Status: AC
  Administered 2014-05-08: 1000 mL via INTRAVENOUS

## 2014-05-08 MED ORDER — FENTANYL CITRATE 0.05 MG/ML IJ SOLN
100.0000 ug | INTRAMUSCULAR | Status: DC | PRN
  Administered 2014-05-08 (×3): 100 ug via INTRAVENOUS
  Filled 2014-05-08 (×3): qty 2

## 2014-05-08 MED ORDER — SODIUM CHLORIDE 0.9 % IV SOLN
1000.0000 mL | INTRAVENOUS | Status: DC
  Administered 2014-05-08: 1000 mL via INTRAVENOUS

## 2014-05-08 MED ORDER — OXYCODONE-ACETAMINOPHEN 5-325 MG PO TABS: 1.0000 | ORAL_TABLET | ORAL | Status: DC | PRN

## 2014-05-08 NOTE — ED Notes (Signed)
Patient resting at this time. No active vomiting. Small amount of sputum in basin. Family member at bedside at this time. Pt sts he is unable to void at this time.

## 2014-05-08 NOTE — Discharge Instructions (Signed)
Your symptoms are related to gastritis or pancreatitis. It would be best to stop smoking. Gradually advance her diet starting with clear liquids over the next 12-24 hours. Start taking Nexium 20 mg twice a day for 3 weeks.    Gastritis, Adult Gastritis is soreness and swelling (inflammation) of the lining of the stomach. Gastritis can develop as a sudden onset (acute) or long-term (chronic) condition. If gastritis is not treated, it can lead to stomach bleeding and ulcers. CAUSES  Gastritis occurs when the stomach lining is weak or damaged. Digestive juices from the stomach then inflame the weakened stomach lining. The stomach lining may be weak or damaged due to viral or bacterial infections. One common bacterial infection is the Helicobacter pylori infection. Gastritis can also result from excessive alcohol consumption, taking certain medicines, or having too much acid in the stomach.  SYMPTOMS  In some cases, there are no symptoms. When symptoms are present, they may include:  Pain or a burning sensation in the upper abdomen.  Nausea.  Vomiting.  An uncomfortable feeling of fullness after eating. DIAGNOSIS  Your caregiver may suspect you have gastritis based on your symptoms and a physical exam. To determine the cause of your gastritis, your caregiver may perform the following:  Blood or stool tests to check for the H pylori bacterium.  Gastroscopy. A thin, flexible tube (endoscope) is passed down the esophagus and into the stomach. The endoscope has a light and camera on the end. Your caregiver uses the endoscope to view the inside of the stomach.  Taking a tissue sample (biopsy) from the stomach to examine under a microscope. TREATMENT  Depending on the cause of your gastritis, medicines may be prescribed. If you have a bacterial infection, such as an H pylori infection, antibiotics may be given. If your gastritis is caused by too much acid in the stomach, H2 blockers or antacids  may be given. Your caregiver may recommend that you stop taking aspirin, ibuprofen, or other nonsteroidal anti-inflammatory drugs (NSAIDs). HOME CARE INSTRUCTIONS  Only take over-the-counter or prescription medicines as directed by your caregiver.  If you were given antibiotic medicines, take them as directed. Finish them even if you start to feel better.  Drink enough fluids to keep your urine clear or pale yellow.  Avoid foods and drinks that make your symptoms worse, such as:  Caffeine or alcoholic drinks.  Chocolate.  Peppermint or mint flavorings.  Garlic and onions.  Spicy foods.  Citrus fruits, such as oranges, lemons, or limes.  Tomato-based foods such as sauce, chili, salsa, and pizza.  Fried and fatty foods.  Eat small, frequent meals instead of large meals. SEEK IMMEDIATE MEDICAL CARE IF:   You have black or dark red stools.  You vomit blood or material that looks like coffee grounds.  You are unable to keep fluids down.  Your abdominal pain gets worse.  You have a fever.  You do not feel better after 1 week.  You have any other questions or concerns. MAKE SURE YOU:  Understand these instructions.  Will watch your condition.  Will get help right away if you are not doing well or get worse. Document Released: 10/22/2001 Document Revised: 04/28/2012 Document Reviewed: 12/11/2011 [facility] Patient Information 2015 [place], [place]. This information is not intended to replace advice given to you by your health care provider. Make sure you discuss any questions you have with your health care provider.

## 2014-05-08 NOTE — ED Notes (Signed)
Patient and Family member state understanding of home care for diet and prescriptions. Patient ambulatory out of department at this time with spouse. Narcotic medication reviewed and pt instructed not to drive or operate equipment with taking medication, pt sts understanding.

## 2014-05-08 NOTE — ED Notes (Signed)
Pt given a 500 ml bolus after tech advised of BP. Pt sleeping at this time & EDP notified.

## 2014-05-08 NOTE — ED Notes (Signed)
PT c/o right sided abdominal pain with n/v and cramping in stomach x2 hrs.

## 2014-05-08 NOTE — ED Notes (Signed)
Patient ambulatory around nurses station. Steady gait, no complaints at this time. ERMD aware of patients condition.

## 2014-05-08 NOTE — ED Notes (Signed)
Patient resting in bed. A&OX4. Gave patient urinal for specimen collection. Patient states he needs more nausea medication, informed patient of the type and amount of medication given at since ED admission. Patient states understanding.

## 2014-05-08 NOTE — ED Provider Notes (Signed)
CSN: [id number]     Arrival date & time 05/08/14  1652 History   This chart was scribed for Troy Blade, MD, by Neta Ehlers, ED Scribe. This patient was seen in room APA08/APA08 and the patient's care was started at 5:11 PM.  First MD Initiated Contact with Patient 05/08/14 1708     Chief Complaint  Patient presents with  . Abdominal Pain    The history is provided by the patient. No language interpreter was used.   HPI Comments: Troy Leon is a 43 y.o. male, with a h/o pancreatitis, who presents to the Emergency Department complaining of right-sided abdominal pain which began three hours ago and has been associated with nausea and multiple episodes of emesis. The pt reports his current symptoms are similar to previous pancreatitis attacks; he also reports he has not had a pancreatitis attack in a couple of months, though his wife asserts it has been a few years. Mr. Zaino has a h/o abdominal surgery including a cholecystectomy. The pt is a current smoker and drinks alcohol.   Past Medical History  Diagnosis Date  . Pancreatitis    Past Surgical History  Procedure Laterality Date  . Cholecystectomy     Family History  Problem Relation Age of Onset  . Diabetes Mother   . Diabetes Other   . Heart attack Other   . Hypertension Other    History  Substance Use Topics  . Smoking status: Current Every Day Smoker    Types: Cigarettes  . Smokeless tobacco: Not on file  . Alcohol Use: Yes    Review of Systems  Gastrointestinal: Positive for nausea, vomiting and abdominal pain.  Psychiatric/Behavioral: Positive for agitation.  All other systems reviewed and are negative.   Allergies  Penicillins  Home Medications   Prior to Admission medications   Not on File   Triage Vitals: BP 113/83  Pulse 117  Resp 24  Ht 5\' 7"  (1.702 m)  Wt 155 lb (70.308 kg)  BMI 24.27 kg/m2  SpO2 96%  Physical Exam  Nursing note and vitals reviewed. Constitutional: He is oriented  to person, place, and time. He appears well-developed. He appears distressed.  Pt is moaning, groaning, and asking for pain medication. Pt is sickly in appearance.   HENT:  Head: Normocephalic and atraumatic.  Right Ear: External ear normal.  Left Ear: External ear normal.  Eyes: Conjunctivae and EOM are normal. Pupils are equal, round, and reactive to light.  Neck: Normal range of motion and phonation normal. Neck supple.  Cardiovascular: Normal rate, regular rhythm, normal heart sounds and intact distal pulses.  Exam reveals no friction rub.   No murmur heard. Pulmonary/Chest: Effort normal and breath sounds normal. No respiratory distress. He has no wheezes. He has no rales. He exhibits no bony tenderness.  Abdominal: Soft. There is tenderness.  Exquisitely tender.   Musculoskeletal: Normal range of motion.  Neurological: He is alert and oriented to person, place, and time. No cranial nerve deficit or sensory deficit. He exhibits normal muscle tone. Coordination normal.  Skin: Skin is warm, dry and intact.  Psychiatric: His behavior is normal. Judgment and thought content normal.  Pt is very agitated.     ED Course  Procedures (including critical care time)  DIAGNOSTIC STUDIES: Oxygen Saturation is 96% on room air, normal by my interpretation.    COORDINATION OF CARE:  Medications  fentaNYL (SUBLIMAZE) injection 100 mcg (100 mcg Intravenous Given 05/08/14 1859)  ondansetron (ZOFRAN) injection 4  mg (4 mg Intravenous Given 05/08/14 1731)  ondansetron (ZOFRAN) injection 4 mg (4 mg Intravenous Given 05/08/14 1800)  promethazine (PHENERGAN) injection 12.5 mg (12.5 mg Intravenous Given 05/08/14 1857)    Patient Vitals for the past 24 hrs:  BP Pulse Resp SpO2 Height Weight  05/08/14 1713 113/83 mmHg 117 24 96 % 5\' 7"  (1.702 m) 155 lb (70.308 kg)    5:18 PM- Discussed treatment plan with patient, and the patient agreed to the plan. The plan includes labs and pain medication.   9:35  PM- Rechecked pt. Pt improved and resting. He reports his mouth is dry. Informed pt of lab results.   11:19 PM Reevaluation with update and discussion. After initial assessment and treatment, an updated evaluation reveals he is comfortable, tolerating liquids. He is able to ambulate in the hall, 150 feet, without difficulty. WENTZ,ELLIOTT L   Labs Review Labs Reviewed  CBC WITH DIFFERENTIAL - Abnormal; Notable for the following:    WBC 15.7 (*)    MCHC 36.5 (*)    Neutro Abs 11.8 (*)    All other components within normal limits  COMPREHENSIVE METABOLIC PANEL - Abnormal; Notable for the following:    Potassium 3.5 (*)    Glucose, Bld 148 (*)    All other components within normal limits  URINE RAPID DRUG SCREEN (HOSP PERFORMED) - Abnormal; Notable for the following:    Tetrahydrocannabinol POSITIVE (*)    All other components within normal limits  URINALYSIS, ROUTINE W REFLEX MICROSCOPIC - Abnormal; Notable for the following:    APPearance CLOUDY (*)    Bilirubin Urine SMALL (*)    Ketones, ur 15 (*)    Protein, ur 30 (*)    All other components within normal limits  URINE MICROSCOPIC-ADD ON - Abnormal; Notable for the following:    Bacteria, UA FEW (*)    All other components within normal limits  URINE CULTURE  LIPASE, BLOOD    Imaging Review No results found.  MDM   Final diagnoses:  Epigastric pain  Gastritis    Nonspecific abdominal pain, with evidence for either gastritis, or pancreatitis. He is improved after treatment, in stable for discharge. He continues to smoke cigarettes and is likely causing a hyperacidity in the stomach. He is encouraged to stop smoking.   Nursing Notes Reviewed/ Care Coordinated Applicable Imaging Reviewed Interpretation of Laboratory Data incorporated into ED treatment  The patient appears reasonably screened and/or stabilized for discharge and I doubt any other medical condition or other [facility] requiring further screening, evaluation, or  treatment in the ED at this time prior to discharge.  Plan: Home Medications- Percocet, Phenergan; Home Treatments- rest, gradually advance diet; return here if the recommended treatment, does not improve the symptoms; Recommended follow up- PCP prn  I personally performed the services described in this documentation, which was scribed in my presence. The recorded information has been reviewed and is accurate.     Troy Blade, MD 05/08/14 [phone]

## 2014-05-10 LAB — URINE CULTURE
Colony Count: NO GROWTH
Culture: NO GROWTH

## 2014-07-31 ENCOUNTER — Encounter (HOSPITAL_COMMUNITY): Payer: Self-pay | Admitting: Emergency Medicine

## 2014-07-31 ENCOUNTER — Emergency Department (HOSPITAL_COMMUNITY)
Admission: EM | Admit: 2014-07-31 | Discharge: 2014-07-31 | Disposition: A | Discharge status: Home / Self Care | Payer: BC Managed Care – PPO | Attending: Emergency Medicine | Admitting: Emergency Medicine

## 2014-07-31 DIAGNOSIS — R109 Unspecified abdominal pain: Secondary | ICD-10-CM | POA: Insufficient documentation

## 2014-07-31 DIAGNOSIS — R1084 Generalized abdominal pain: Secondary | ICD-10-CM | POA: Insufficient documentation

## 2014-07-31 DIAGNOSIS — Z880 Allergy status to penicillin: Secondary | ICD-10-CM | POA: Insufficient documentation

## 2014-07-31 DIAGNOSIS — Z8719 Personal history of other diseases of the digestive system: Secondary | ICD-10-CM | POA: Insufficient documentation

## 2014-07-31 DIAGNOSIS — Z9089 Acquired absence of other organs: Secondary | ICD-10-CM | POA: Insufficient documentation

## 2014-07-31 DIAGNOSIS — F17200 Nicotine dependence, unspecified, uncomplicated: Secondary | ICD-10-CM | POA: Insufficient documentation

## 2014-07-31 DIAGNOSIS — R197 Diarrhea, unspecified: Secondary | ICD-10-CM | POA: Insufficient documentation

## 2014-07-31 DIAGNOSIS — R112 Nausea with vomiting, unspecified: Secondary | ICD-10-CM | POA: Insufficient documentation

## 2014-07-31 DIAGNOSIS — I498 Other specified cardiac arrhythmias: Secondary | ICD-10-CM | POA: Insufficient documentation

## 2014-07-31 LAB — COMPREHENSIVE METABOLIC PANEL
ALT: 22 U/L (ref 0–53)
AST: 24 U/L (ref 0–37)
Albumin: 4.3 g/dL (ref 3.5–5.2)
Alkaline Phosphatase: 60 U/L (ref 39–117)
Anion gap: 15 (ref 5–15)
BUN: 14 mg/dL (ref 6–23)
CO2: 22 mEq/L (ref 19–32)
Calcium: 10 mg/dL (ref 8.4–10.5)
Chloride: 104 mEq/L (ref 96–112)
Creatinine, Ser: 0.81 mg/dL (ref 0.50–1.35)
GFR calc Af Amer: >90 mL/min (ref >90)
GFR calc non Af Amer: >90 mL/min (ref >90)
Glucose, Bld: 125 mg/dL — ABNORMAL HIGH (ref 70–99)
Potassium: 3.9 mEq/L (ref 3.7–5.3)
Sodium: 141 mEq/L (ref 137–147)
Total Bilirubin: 1 mg/dL (ref 0.3–1.2)
Total Protein: 7.7 g/dL (ref 6.0–8.3)

## 2014-07-31 LAB — CBC WITH DIFFERENTIAL/PLATELET
Basophils Absolute: 0 10*3/uL (ref 0.0–0.1)
Basophils Relative: 0 % (ref 0–1)
Eosinophils Absolute: 0.1 10*3/uL (ref 0.0–0.7)
Eosinophils Relative: 1 % (ref 0–5)
HCT: 42.9 % (ref 39.0–52.0)
Hemoglobin: 15.3 g/dL (ref 13.0–17.0)
Lymphocytes Relative: 29 % (ref 12–46)
Lymphs Abs: 2.6 10*3/uL (ref 0.7–4.0)
MCH: 31.5 pg (ref 26.0–34.0)
MCHC: 35.7 g/dL (ref 30.0–36.0)
MCV: 88.5 fL (ref 78.0–100.0)
Monocytes Absolute: 0.6 10*3/uL (ref 0.1–1.0)
Monocytes Relative: 6 % (ref 3–12)
Neutro Abs: 5.8 10*3/uL (ref 1.7–7.7)
Neutrophils Relative %: 64 % (ref 43–77)
Platelets: 226 10*3/uL (ref 150–400)
RBC: 4.85 MIL/uL (ref 4.22–5.81)
RDW: 12.6 % (ref 11.5–15.5)
WBC: 9 10*3/uL (ref 4.0–10.5)

## 2014-07-31 LAB — URINALYSIS, ROUTINE W REFLEX MICROSCOPIC
Bilirubin Urine: NEGATIVE
Glucose, UA: NEGATIVE mg/dL
Hgb urine dipstick: NEGATIVE
Ketones, ur: NEGATIVE mg/dL
Leukocytes, UA: NEGATIVE
Nitrite: NEGATIVE
Protein, ur: NEGATIVE mg/dL
Specific Gravity, Urine: 1.015 (ref 1.005–1.030)
Urobilinogen, UA: 0.2 mg/dL (ref 0.0–1.0)
pH: >9 — ABNORMAL HIGH (ref 5.0–8.0)

## 2014-07-31 LAB — LIPASE, BLOOD: Lipase: 82 U/L — ABNORMAL HIGH (ref 11–59)

## 2014-07-31 MED ORDER — NAPROXEN 375 MG PO TABS: 375.0000 mg | ORAL_TABLET | Freq: Two times a day (BID) | ORAL | Status: DC

## 2014-07-31 MED ORDER — HYDROMORPHONE HCL 1 MG/ML IJ SOLN: 1.0000 mg | Freq: Once | INTRAMUSCULAR | Status: DC

## 2014-07-31 MED ORDER — ONDANSETRON 4 MG PO TBDP: ORAL_TABLET | ORAL | Status: DC

## 2014-07-31 MED ORDER — FENTANYL CITRATE 0.05 MG/ML IJ SOLN: 50.0000 ug | INTRAMUSCULAR | Status: DC | PRN

## 2014-07-31 MED ORDER — FENTANYL CITRATE 0.05 MG/ML IJ SOLN
50.0000 ug | Freq: Once | INTRAMUSCULAR | Status: AC
Start: 2014-07-31 — End: 2014-07-31
  Administered 2014-07-31: 50 ug via INTRAVENOUS
  Filled 2014-07-31: qty 2

## 2014-07-31 MED ORDER — SODIUM CHLORIDE 0.9 % IV BOLUS (SEPSIS)
1000.0000 mL | Freq: Once | INTRAVENOUS | Status: AC
  Administered 2014-07-31: 1000 mL via INTRAVENOUS

## 2014-07-31 MED ORDER — METOCLOPRAMIDE HCL 5 MG/ML IJ SOLN
10.0000 mg | Freq: Once | INTRAMUSCULAR | Status: AC
  Administered 2014-07-31: 10 mg via INTRAVENOUS
  Filled 2014-07-31: qty 2

## 2014-07-31 MED ORDER — ONDANSETRON HCL 4 MG/2ML IJ SOLN
INTRAMUSCULAR | Status: AC
  Filled 2014-07-31: qty 2

## 2014-07-31 MED ORDER — HYDROMORPHONE HCL 1 MG/ML IJ SOLN
1.0000 mg | INTRAMUSCULAR | Status: DC | PRN
  Administered 2014-07-31: 1 mg via INTRAVENOUS
  Filled 2014-07-31: qty 1

## 2014-07-31 MED ORDER — ONDANSETRON HCL 4 MG/2ML IJ SOLN
4.0000 mg | Freq: Once | INTRAMUSCULAR | Status: AC
  Administered 2014-07-31: 4 mg via INTRAMUSCULAR

## 2014-07-31 NOTE — ED Provider Notes (Signed)
CSN: [id number]     Arrival date & time 07/31/14  1016 History  This chart was scribed for Mariea Clonts, MD by Tula Nakayama, ED Scribe. This patient was seen in room APA04/APA04 and the patient's care was started at 10:59 AM.    Chief Complaint  Patient presents with  . Abdominal Pain  . Emesis   HPI HPI Comments: Troy Leon is a 43 y.o. male with a history of pancreatis who presents to the Emergency Department complaining of intermittent vomiting, nausea, and diarrhea that started this morning and constant, sudden abdominal pain that started when he arrived to ED 20 minutes prior. Pt states symptoms are similar to past episode of pancreatitis. Pt denies hematemesis, chills, hematochezia, rashes, swelling in legs, fever, HA, and vision changes. Pt has positive sick contact at home. Pt denies ETOH drinking. Pt has cholecystectomy.    Past Medical History  Diagnosis Date  . Pancreatitis    Past Surgical History  Procedure Laterality Date  . Cholecystectomy     Family History  Problem Relation Age of Onset  . Diabetes Mother   . Diabetes Other   . Heart attack Other   . Hypertension Other    History  Substance Use Topics  . Smoking status: Current Every Day Smoker    Types: Cigarettes  . Smokeless tobacco: Not on file  . Alcohol Use: No    Review of Systems    Allergies  Penicillins  Home Medications   Prior to Admission medications   Medication Sig Start Date End Date Taking? Authorizing Provider  oxyCODONE-acetaminophen (PERCOCET) 5-325 MG per tablet Take 1 tablet by mouth every 4 (four) hours as needed. 05/08/14   Richarda Blade, MD  promethazine (PHENERGAN) 25 MG tablet Take 1 tablet (25 mg total) by mouth every 6 (six) hours as needed for nausea or vomiting. 05/08/14   Richarda Blade, MD   BP 117/55  Pulse 58  Temp(Src) 97.5 F (36.4 C) (Oral)  Resp 24  Ht 5\' 6"  (1.676 m)  Wt 155 lb (70.308 kg)  BMI 25.03 kg/m2  SpO2 100%  Physical Exam   Constitutional: He appears well-developed and well-nourished. He appears distressed.  HENT:  Head: Normocephalic and atraumatic.  Mouth/Throat: Mucous membranes are dry.  Eyes: Conjunctivae and EOM are normal. Pupils are equal, round, and reactive to light. No scleral icterus.  Neck: Neck supple. No tracheal deviation present.  Cardiovascular: Bradycardia present.   Pulmonary/Chest: Effort normal and breath sounds normal. No respiratory distress.  Abdominal: Bowel sounds are decreased. There is tenderness (moderate) in the epigastric area and periumbilical area.  Musculoskeletal: He exhibits no edema.  Skin: Skin is warm and dry.  Psychiatric: He has a normal mood and affect. His behavior is normal.    ED Course  Procedures (including critical care time)  DIAGNOSTIC STUDIES: Oxygen Saturation is 100% on RA, normal by my interpretation.    COORDINATION OF CARE: 11:07 AM Will order Zofran to be administered in the ED and a CT Scan of the abdomen. Pt agreed to plan.    Labs Review Labs Reviewed  COMPREHENSIVE METABOLIC PANEL - Abnormal; Notable for the following:    Glucose, Bld 125 (*)    All other components within normal limits  LIPASE, BLOOD - Abnormal; Notable for the following:    Lipase 82 (*)    All other components within normal limits  URINALYSIS, ROUTINE W REFLEX MICROSCOPIC - Abnormal; Notable for the following:    Color,  Urine AMBER (*)    APPearance CLOUDY (*)    pH >9.0 (*)    All other components within normal limits  CBC WITH DIFFERENTIAL    Imaging Review No results found.   EKG Interpretation None      MDM   Final diagnoses:  Abdominal pain, generalized  Non-intractable vomiting with nausea, vomiting of unspecified type   Patient presenting with central abdominal pain similar to previous pancreatitis, blood work overall unremarkable mild elevation in lipase. Patient feels significantly better on recheck. Differential including early  pancreatitis, gastroenteritis, other. Patient comfortable the outpatient followup. Oral fluids given in ER.  Results and differential diagnosis were discussed with the patient/parent/guardian. Close follow up outpatient was discussed, comfortable with the plan.   Medications  HYDROmorphone (DILAUDID) injection 1 mg (1 mg Intravenous Not Given 07/31/14 1134)  fentaNYL (SUBLIMAZE) injection 50 mcg (not administered)  ondansetron (ZOFRAN) injection 4 mg (4 mg Intramuscular Given 07/31/14 1035)  fentaNYL (SUBLIMAZE) injection 50 mcg (50 mcg Intravenous Given 07/31/14 1049)  metoCLOPramide (REGLAN) injection 10 mg (10 mg Intravenous Given 07/31/14 1121)  sodium chloride 0.9 % bolus 1,000 mL (0 mLs Intravenous Stopped 07/31/14 1428)    Filed Vitals:   07/31/14 1216 07/31/14 1230 07/31/14 1300 07/31/14 1319  BP: 89/50 85/73 89/53  89/51  Pulse: 48 49 44 42  Temp:      TempSrc:      Resp:      Height:      Weight:      SpO2: 94% 97% 99% 100%    Final diagnoses:  Abdominal pain, generalized  Non-intractable vomiting with nausea, vomiting of unspecified type      Mariea Clonts, MD 07/31/14 1433

## 2014-07-31 NOTE — ED Notes (Signed)
Vomiting and diarrhea this morning.  Having abdominal pain now.  States he has a history of pancreatitis.

## 2014-07-31 NOTE — Discharge Instructions (Signed)
If you were given medicines take as directed.  If you are on coumadin or contraceptives realize their levels and effectiveness is altered by many different medicines.  If you have any reaction (rash, tongues swelling, other) to the medicines stop taking and see a physician.   Please follow up as directed and return to the ER or see a physician for new or worsening symptoms.  Thank you. Filed Vitals:   07/31/14 1216 07/31/14 1230 07/31/14 1300 07/31/14 1319  BP: 89/50 85/73 89/53  89/51  Pulse: 48 49 44 42  Temp:      TempSrc:      Resp:      Height:      Weight:      SpO2: 94% 97% 99% 100%

## 2015-10-13 ENCOUNTER — Emergency Department (HOSPITAL_COMMUNITY)
Admission: EM | Admit: 2015-10-13 | Discharge: 2015-10-13 | Disposition: A | Discharge status: Home / Self Care | Payer: Managed Care, Other (non HMO) | Attending: Emergency Medicine | Admitting: Emergency Medicine

## 2015-10-13 ENCOUNTER — Encounter (HOSPITAL_COMMUNITY): Payer: Self-pay | Admitting: *Deleted

## 2015-10-13 DIAGNOSIS — R1013 Epigastric pain: Secondary | ICD-10-CM | POA: Insufficient documentation

## 2015-10-13 DIAGNOSIS — F17210 Nicotine dependence, cigarettes, uncomplicated: Secondary | ICD-10-CM | POA: Diagnosis not present

## 2015-10-13 DIAGNOSIS — Z8719 Personal history of other diseases of the digestive system: Secondary | ICD-10-CM | POA: Insufficient documentation

## 2015-10-13 DIAGNOSIS — R109 Unspecified abdominal pain: Secondary | ICD-10-CM | POA: Diagnosis present

## 2015-10-13 DIAGNOSIS — Z880 Allergy status to penicillin: Secondary | ICD-10-CM | POA: Insufficient documentation

## 2015-10-13 LAB — COMPREHENSIVE METABOLIC PANEL
ALT: 22 U/L (ref 17–63)
AST: 30 U/L (ref 15–41)
Albumin: 4.4 g/dL (ref 3.5–5.0)
Alkaline Phosphatase: 48 U/L (ref 38–126)
Anion gap: 17 — ABNORMAL HIGH (ref 5–15)
BUN: 19 mg/dL (ref 6–20)
CO2: 18 mmol/L — ABNORMAL LOW (ref 22–32)
Calcium: 9.3 mg/dL (ref 8.9–10.3)
Chloride: 104 mmol/L (ref 101–111)
Creatinine, Ser: 0.88 mg/dL (ref 0.61–1.24)
GFR calc Af Amer: >60 mL/min (ref >60)
GFR calc non Af Amer: >60 mL/min (ref >60)
Glucose, Bld: 160 mg/dL — ABNORMAL HIGH (ref 65–99)
Potassium: 3.7 mmol/L (ref 3.5–5.1)
Sodium: 139 mmol/L (ref 135–145)
Total Bilirubin: 1.7 mg/dL — ABNORMAL HIGH (ref 0.3–1.2)
Total Protein: 7.1 g/dL (ref 6.5–8.1)

## 2015-10-13 LAB — CBC
HCT: 39.1 % (ref 39.0–52.0)
Hemoglobin: 13.8 g/dL (ref 13.0–17.0)
MCH: 31.4 pg (ref 26.0–34.0)
MCHC: 35.3 g/dL (ref 30.0–36.0)
MCV: 89.1 fL (ref 78.0–100.0)
Platelets: 202 10*3/uL (ref 150–400)
RBC: 4.39 MIL/uL (ref 4.22–5.81)
RDW: 12 % (ref 11.5–15.5)
WBC: 13.5 10*3/uL — ABNORMAL HIGH (ref 4.0–10.5)

## 2015-10-13 LAB — LIPASE, BLOOD: Lipase: 32 U/L (ref 11–51)

## 2015-10-13 MED ORDER — ONDANSETRON HCL 4 MG/2ML IJ SOLN
4.0000 mg | Freq: Once | INTRAMUSCULAR | Status: AC | PRN
  Administered 2015-10-13: 4 mg via INTRAVENOUS

## 2015-10-13 MED ORDER — HYDROCODONE-ACETAMINOPHEN 7.5-325 MG/15ML PO SOLN: 10.0000 mL | ORAL | Status: DC | PRN

## 2015-10-13 MED ORDER — RANITIDINE HCL 150 MG/10ML PO SYRP: 150.0000 mg | ORAL_SOLUTION | Freq: Every day | ORAL | Status: DC

## 2015-10-13 MED ORDER — ONDANSETRON HCL 4 MG/2ML IJ SOLN
INTRAMUSCULAR | Status: AC
  Administered 2015-10-13: 4 mg via INTRAVENOUS
  Filled 2015-10-13: qty 2

## 2015-10-13 MED ORDER — SODIUM CHLORIDE 0.9 % IV BOLUS (SEPSIS)
1000.0000 mL | Freq: Once | INTRAVENOUS | Status: AC
  Administered 2015-10-13: 1000 mL via INTRAVENOUS

## 2015-10-13 MED ORDER — PROMETHAZINE HCL 25 MG/ML IJ SOLN
12.5000 mg | Freq: Once | INTRAMUSCULAR | Status: AC
  Administered 2015-10-13: 12.5 mg via INTRAVENOUS
  Filled 2015-10-13: qty 1

## 2015-10-13 MED ORDER — ONDANSETRON 4 MG PO TBDP: 4.0000 mg | ORAL_TABLET | Freq: Three times a day (TID) | ORAL | Status: DC | PRN

## 2015-10-13 MED ORDER — MORPHINE SULFATE (PF) 4 MG/ML IV SOLN
4.0000 mg | Freq: Once | INTRAVENOUS | Status: AC
  Administered 2015-10-13: 4 mg via INTRAVENOUS
  Filled 2015-10-13: qty 1

## 2015-10-13 MED ORDER — HYDROMORPHONE HCL 1 MG/ML IJ SOLN
1.0000 mg | Freq: Once | INTRAMUSCULAR | Status: AC
Start: 2015-10-13 — End: 2015-10-13
  Administered 2015-10-13: 1 mg via INTRAVENOUS
  Filled 2015-10-13: qty 1

## 2015-10-13 NOTE — ED Provider Notes (Signed)
CSN: [id number]     Arrival date & time 10/13/15  1107 History  By signing my name below, I, Starleen Arms, attest that this documentation has been prepared under the direction and in the presence of Virgel Manifold, MD. Electronically Signed: Starleen Arms, ED Scribe. 10/13/2015. 12:45 PM.    Chief Complaint  Patient presents with  . Abdominal Pain   The history is provided by the patient and the spouse. No language interpreter was used.   HPI Comments: Troy Leon is a 44 y.o. male who presents to the Emergency Department complaining of non-radiating, constant, unchanged abdominal pain onset 8:00 am today.  Associated symptoms include nausea and diarrhea.  The wife reports the patient has had cold-like symptoms for 1 week.  The patient reports similar episodes every 3 months that have been previously dx'd as IBS, diverticulitis, and pancreatitis.  Patient has hx of cholecystectomy.  Brother and sister have hx of similar, recurrent episodes.  Patient does not consume alcohol.  He denies blood in stool, vomiting, abnormal urination.     Past Medical History  Diagnosis Date  . Pancreatitis    Past Surgical History  Procedure Laterality Date  . Cholecystectomy     Family History  Problem Relation Age of Onset  . Diabetes Mother   . Diabetes Other   . Heart attack Other   . Hypertension Other    Social History  Substance Use Topics  . Smoking status: Current Every Day Smoker    Types: Cigarettes  . Smokeless tobacco: None  . Alcohol Use: No    Review of Systems 10 Systems reviewed and all are negative for acute change except as noted in the HPI.  Allergies  Penicillins  Home Medications   Prior to Admission medications   Medication Sig Start Date End Date Taking? Authorizing Provider  ondansetron (ZOFRAN ODT) 4 MG disintegrating tablet 4mg  ODT q4 hours prn nausea/vomit 07/31/14  Yes Elnora Morrison, MD  promethazine (PHENERGAN) 25 MG tablet Take 25 mg by mouth every 6 (six)  hours as needed for nausea or vomiting.   Yes Historical Provider, MD   BP 121/57 mmHg  Pulse 46  Resp 16  Ht 5\' 7"  (1.702 m)  Wt 155 lb (70.308 kg)  BMI 24.27 kg/m2  SpO2 100% Physical Exam  Constitutional: He is oriented to person, place, and time. He appears well-developed and well-nourished.  Appears uncomfortable.    HENT:  Head: Normocephalic and atraumatic.  Eyes: EOM are normal.  Neck: Normal range of motion.  Cardiovascular: Normal rate, regular rhythm, normal heart sounds and intact distal pulses.   Pulmonary/Chest: Effort normal and breath sounds normal. No respiratory distress.  Abdominal: Soft. He exhibits no distension. There is tenderness. There is guarding.  Tenderness with guarding in the epigastrium  Musculoskeletal: Normal range of motion.  Neurological: He is alert and oriented to person, place, and time.  Skin: Skin is warm and dry.  Psychiatric: He has a normal mood and affect. Judgment normal.  Nursing note and vitals reviewed.   ED Course  Procedures (including critical care time)  DIAGNOSTIC STUDIES: Oxygen Saturation is 100% on RA, normal by my interpretation.    COORDINATION OF CARE:  12:41 PM Will order pain medication and anti-emetic.  Patient acknowledges and agrees with plan.    Labs Review Labs Reviewed  COMPREHENSIVE METABOLIC PANEL - Abnormal; Notable for the following:    CO2 18 (*)    Glucose, Bld 160 (*)    Total Bilirubin  1.7 (*)    Anion gap 17 (*)    All other components within normal limits  CBC - Abnormal; Notable for the following:    WBC 13.5 (*)    All other components within normal limits  LIPASE, BLOOD    Imaging Review No results found. I have personally reviewed and evaluated these images and lab results as part of my medical decision-making.   EKG Interpretation None      MDM   Final diagnoses:  Epigastric pain    44 year old male with epigastric pain. Doubt atypical ACS. Patient probably has recurrent  episodes of similar type pain. Workup today is fairly unremarkable. Patient did symptomatically with marked improvement of symptoms. Repeat abdominal exam prior to discharge with minimal epigastric tenderness without any peritoneal signs. It has been determined that no acute conditions requiring further emergency intervention are present at this time. The patient has been advised of the diagnosis and plan. I reviewed any labs and imaging including any potential incidental findings. We have discussed signs and symptoms that warrant return to the ED and they are listed in the discharge instructions.   I personally preformed the services scribed in my presence. The recorded information has been reviewed is accurate. Virgel Manifold, MD.     Virgel Manifold, MD 10/20/15 [phone]

## 2015-10-13 NOTE — ED Notes (Signed)
Pt and pt's brother has episodes of pancreatitis, pt 's abd pain with emesis started this morning at 0730, wife states pt does NOT drink ETOH

## 2015-10-13 NOTE — Discharge Instructions (Signed)

## 2015-10-13 NOTE — ED Notes (Signed)
MD at bedside. 

## 2016-12-09 ENCOUNTER — Emergency Department (HOSPITAL_COMMUNITY): Payer: Managed Care, Other (non HMO)

## 2016-12-09 ENCOUNTER — Encounter (HOSPITAL_COMMUNITY): Payer: Self-pay | Admitting: Emergency Medicine

## 2016-12-09 ENCOUNTER — Emergency Department (HOSPITAL_COMMUNITY)
Admission: EM | Admit: 2016-12-09 | Discharge: 2016-12-09 | Disposition: A | Discharge status: Home / Self Care | Payer: Managed Care, Other (non HMO) | Attending: Emergency Medicine | Admitting: Emergency Medicine

## 2016-12-09 DIAGNOSIS — R1084 Generalized abdominal pain: Secondary | ICD-10-CM | POA: Insufficient documentation

## 2016-12-09 DIAGNOSIS — R112 Nausea with vomiting, unspecified: Secondary | ICD-10-CM | POA: Diagnosis present

## 2016-12-09 DIAGNOSIS — R197 Diarrhea, unspecified: Secondary | ICD-10-CM | POA: Insufficient documentation

## 2016-12-09 DIAGNOSIS — F17210 Nicotine dependence, cigarettes, uncomplicated: Secondary | ICD-10-CM | POA: Insufficient documentation

## 2016-12-09 LAB — COMPREHENSIVE METABOLIC PANEL
ALT: 19 U/L (ref 17–63)
AST: 25 U/L (ref 15–41)
Albumin: 4.2 g/dL (ref 3.5–5.0)
Alkaline Phosphatase: 44 U/L (ref 38–126)
Anion gap: 13 (ref 5–15)
BUN: 16 mg/dL (ref 6–20)
CO2: 21 mmol/L — ABNORMAL LOW (ref 22–32)
Calcium: 9 mg/dL (ref 8.9–10.3)
Chloride: 102 mmol/L (ref 101–111)
Creatinine, Ser: 0.94 mg/dL (ref 0.61–1.24)
GFR calc Af Amer: >60 mL/min (ref >60)
GFR calc non Af Amer: >60 mL/min (ref >60)
Glucose, Bld: 218 mg/dL — ABNORMAL HIGH (ref 65–99)
Potassium: 3.6 mmol/L (ref 3.5–5.1)
Sodium: 136 mmol/L (ref 135–145)
Total Bilirubin: 2.2 mg/dL — ABNORMAL HIGH (ref 0.3–1.2)
Total Protein: 7.1 g/dL (ref 6.5–8.1)

## 2016-12-09 LAB — CBG MONITORING, ED: Glucose-Capillary: 132 mg/dL — ABNORMAL HIGH (ref 65–99)

## 2016-12-09 LAB — CBC WITH DIFFERENTIAL/PLATELET
Basophils Absolute: 0 10*3/uL (ref 0.0–0.1)
Basophils Relative: 0 %
Eosinophils Absolute: 0 10*3/uL (ref 0.0–0.7)
Eosinophils Relative: 0 %
HCT: 44.2 % (ref 39.0–52.0)
Hemoglobin: 15.5 g/dL (ref 13.0–17.0)
Lymphocytes Relative: 3 %
Lymphs Abs: 0.4 10*3/uL — ABNORMAL LOW (ref 0.7–4.0)
MCH: 31.8 pg (ref 26.0–34.0)
MCHC: 35.1 g/dL (ref 30.0–36.0)
MCV: 90.8 fL (ref 78.0–100.0)
Monocytes Absolute: 0.6 10*3/uL (ref 0.1–1.0)
Monocytes Relative: 5 %
Neutro Abs: 12.2 10*3/uL — ABNORMAL HIGH (ref 1.7–7.7)
Neutrophils Relative %: 92 %
Platelets: 182 10*3/uL (ref 150–400)
RBC: 4.87 MIL/uL (ref 4.22–5.81)
RDW: 12.3 % (ref 11.5–15.5)
WBC: 13.2 10*3/uL — ABNORMAL HIGH (ref 4.0–10.5)

## 2016-12-09 LAB — LIPASE, BLOOD: Lipase: 22 U/L (ref 11–51)

## 2016-12-09 IMAGING — CT CT ABD-PELV W/ CM
2 of 4 series · 16 of 46 positions shown, 18 images · IV contrast (Isovue)
Comparison: CT [DATE]

CLINICAL DATA: Generalized abdominal pain for 1 day.  Nausea.

EXAM:
CT ABDOMEN AND PELVIS WITH CONTRAST
TECHNIQUE: Multidetector CT imaging of the abdomen and pelvis was performed
using the standard protocol following bolus administration of
intravenous contrast.
CONTRAST:  100mL [VQ] IOPAMIDOL ([VQ]) INJECTION 61%

[Series 2: axial st · axial · 0.67mm/px · z∈[-406,-16]mm · 13 of 86 slices shown, 15 images]
[im 4/86  soft-tissue]
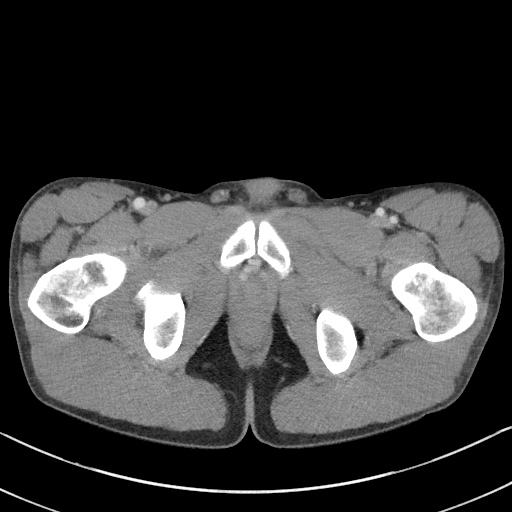
[im 4/86  bone]
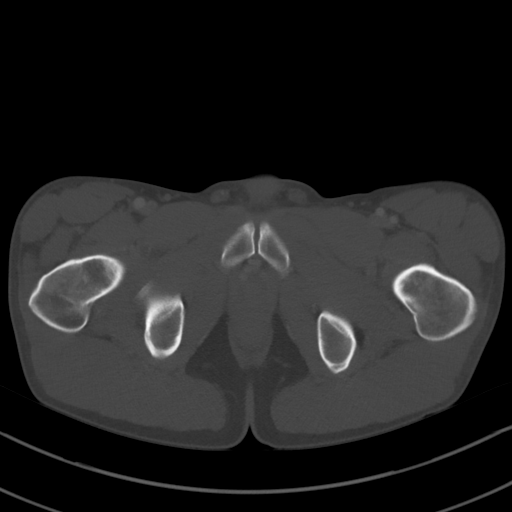
[im 12/86  soft-tissue]
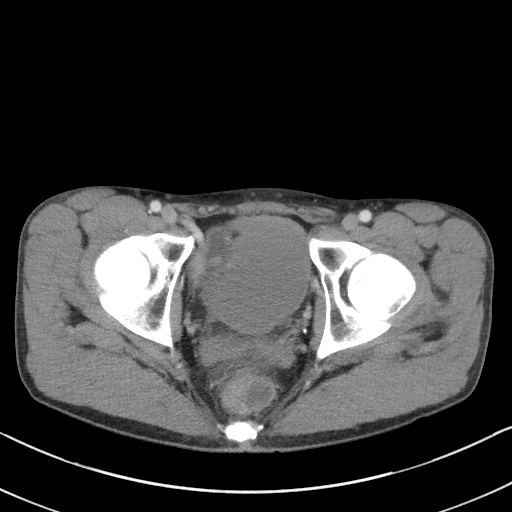
[im 19/86  soft-tissue]
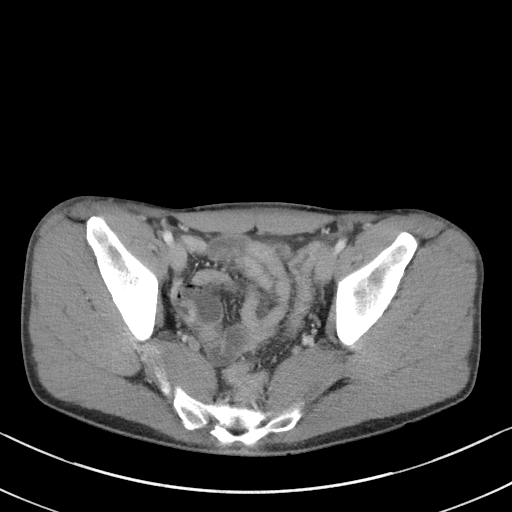
[im 23/86  soft-tissue]
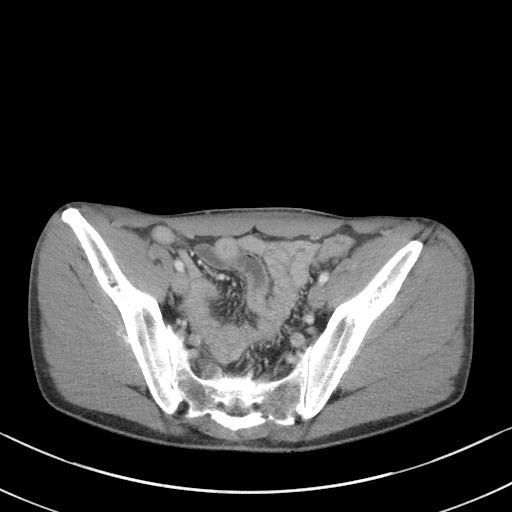
[im 30/86  soft-tissue]
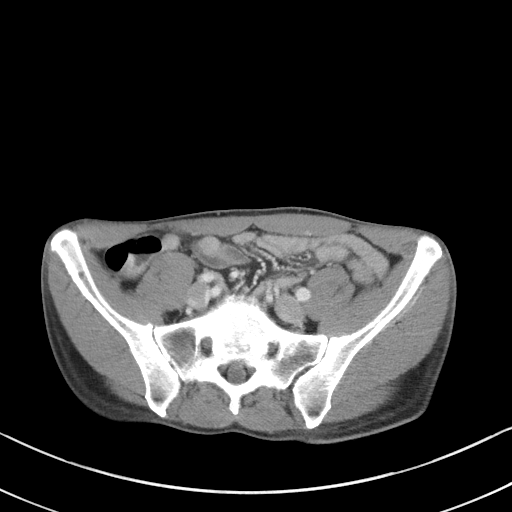
[im 37/86  soft-tissue]
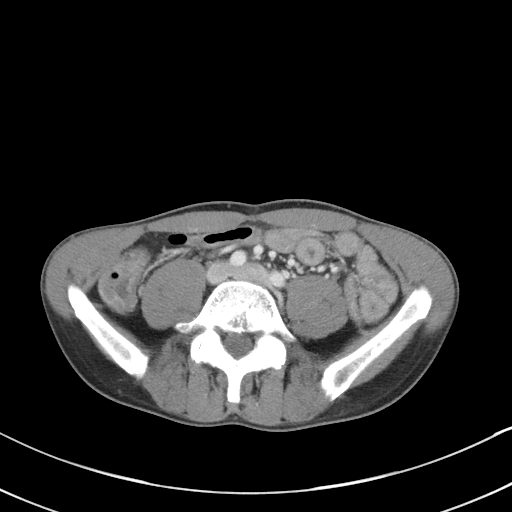
[im 45/86  soft-tissue]
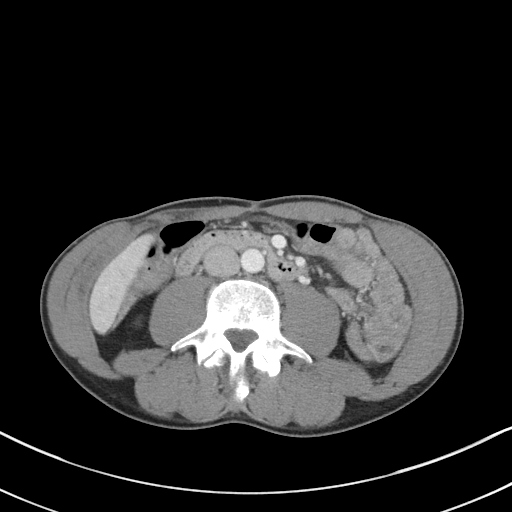
[im 49/86  soft-tissue]
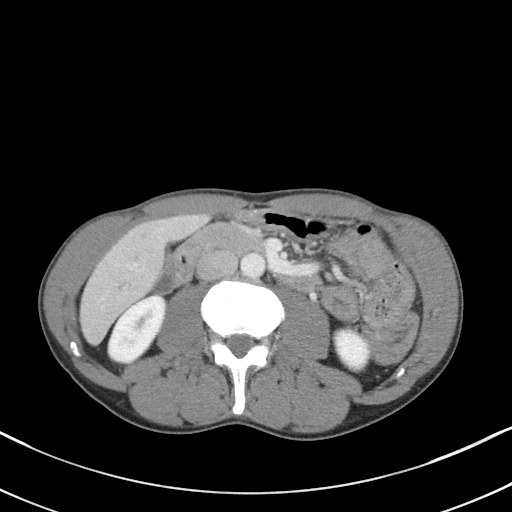
[im 56/86  soft-tissue]
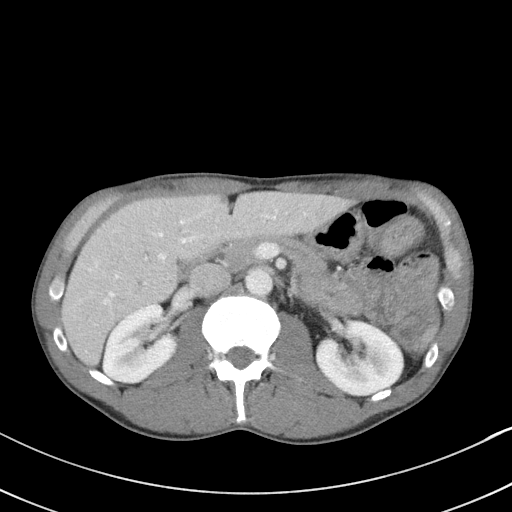
[im 56/86  bone]
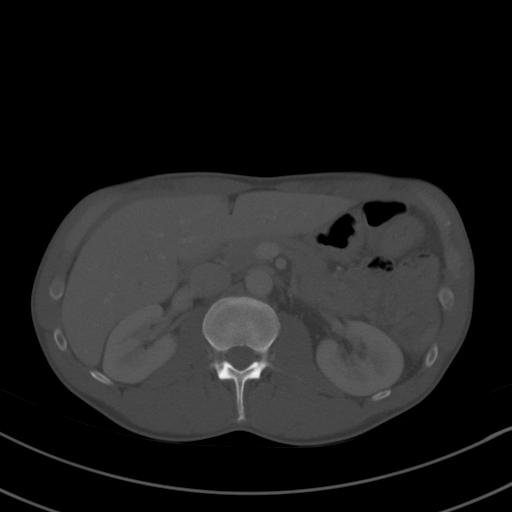
[im 63/86  soft-tissue]
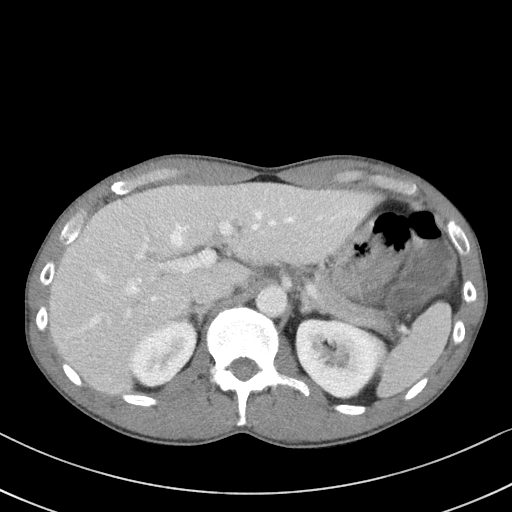
[im 67/86  soft-tissue]
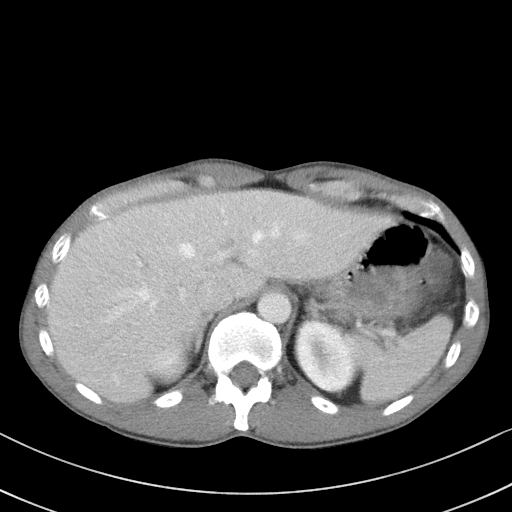
[im 74/86  soft-tissue]
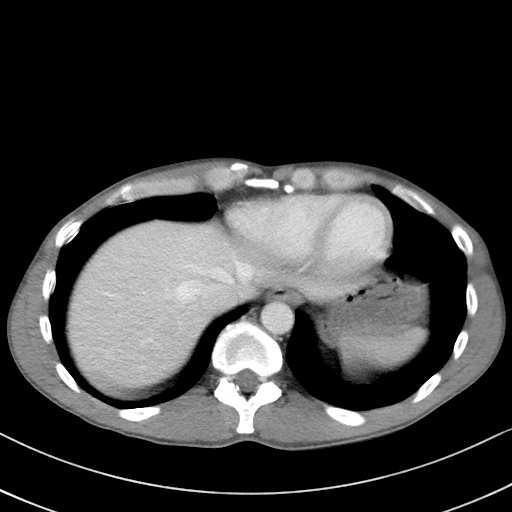
[im 82/86  soft-tissue]
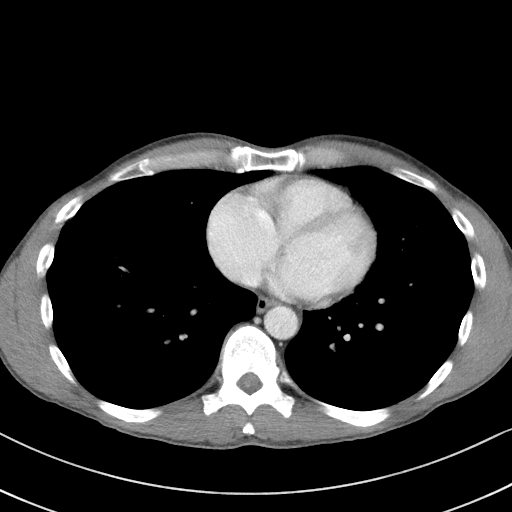

[Series 5: coronal st · coronal · 0.62mm/px · 3 of 79 slices shown]
[im 27/79  soft-tissue]
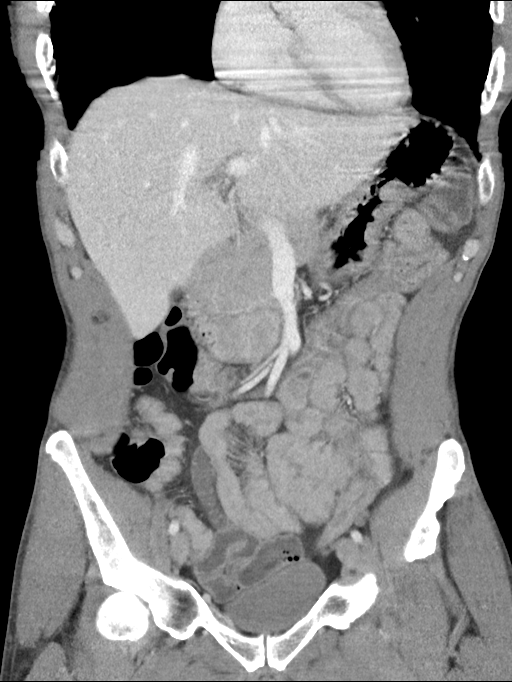
[im 35/79  soft-tissue]
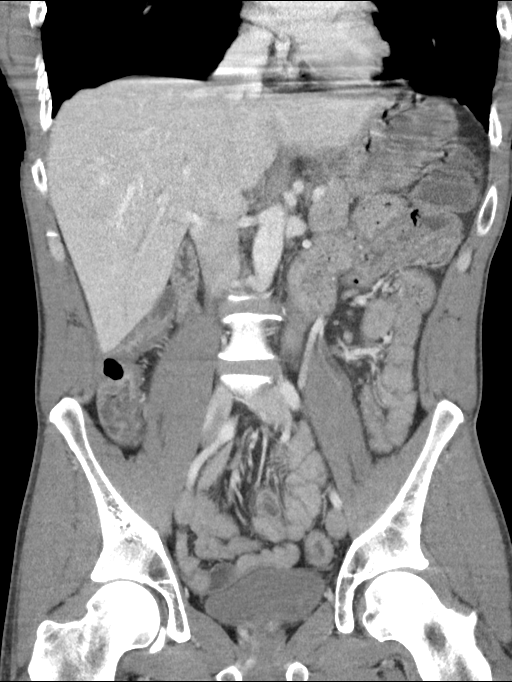
[im 44/79  soft-tissue]
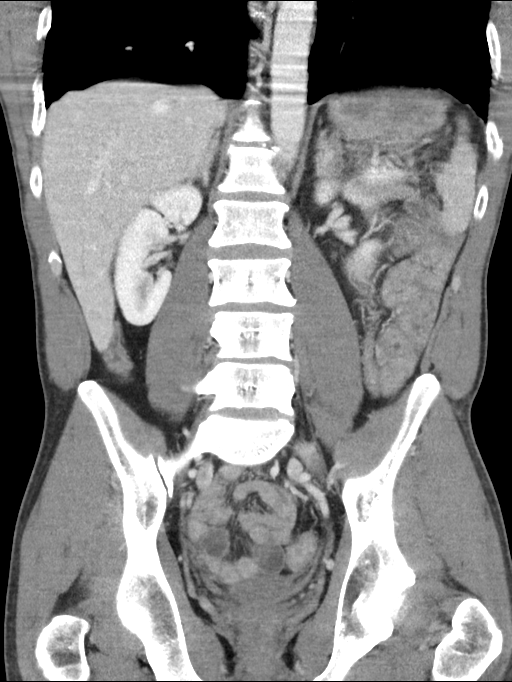

[16 of 46 positions shown; findings below may reference images not displayed]

FINDINGS: Lower chest: Motion artifact. No consolidation. No definite pleural
fluid.

Hepatobiliary: Tiny 5 mm hypodensity within the right lobe of the
liver, too small to characterize. Postcholecystectomy with mild
prominence of central intrahepatic ducts. No evidence of common bile
duct dilatation.

Pancreas: No ductal dilatation or inflammation.

Spleen: Normal in size without focal abnormality.

Adrenals/Urinary Tract: No adrenal nodule. No hydronephrosis or
perinephric edema. Symmetric enhancement and excretion on delayed
phase imaging. Urinary bladder is physiologically distended, no wall
thickening.

Stomach/Bowel: Stomach is decompressed. No bowel dilatation to
suggest obstruction. Scattered fluid with small bowel loops in the
lower abdomen and pelvis, with equivocal scattered small bowel
thickening. Majority of the colon is decompressed. Appendix
tentatively identified and normal, no evidence appendicitis.

Vascular/Lymphatic: No significant vascular findings are present. No
enlarged abdominal or pelvic lymph nodes.

Reproductive: Prostate is unremarkable.

Other: Small amount of simple free fluid in the pelvis. New free
air. No loculated abscess.

Musculoskeletal: There are no acute or suspicious osseous
abnormalities. Facet arthropathy in the lumbar spine.
IMPRESSION: 1. Scattered fluid within small bowel and mild wall thickening,
nonspecific but suggesting enteritis pattern. Minimal free fluid in
the pelvis is likely reactive.
2. Otherwise no acute abnormality. No CT findings of acute
pancreatitis.

## 2016-12-09 MED ORDER — ONDANSETRON HCL 4 MG PO TABS: 4.0000 mg | ORAL_TABLET | Freq: Four times a day (QID) | ORAL | 0 refills | Status: DC

## 2016-12-09 MED ORDER — SODIUM CHLORIDE 0.9 % IV BOLUS (SEPSIS)
1000.0000 mL | Freq: Once | INTRAVENOUS | Status: AC
  Administered 2016-12-09: 1000 mL via INTRAVENOUS

## 2016-12-09 MED ORDER — HYDROMORPHONE HCL 1 MG/ML IJ SOLN
1.0000 mg | Freq: Once | INTRAMUSCULAR | Status: AC
  Administered 2016-12-09: 1 mg via INTRAVENOUS

## 2016-12-09 MED ORDER — ONDANSETRON HCL 4 MG/2ML IJ SOLN
4.0000 mg | Freq: Once | INTRAMUSCULAR | Status: AC
  Administered 2016-12-09: 4 mg via INTRAVENOUS
  Filled 2016-12-09: qty 2

## 2016-12-09 MED ORDER — ONDANSETRON HCL 4 MG/2ML IJ SOLN
4.0000 mg | Freq: Once | INTRAMUSCULAR | Status: AC
  Administered 2016-12-09: 4 mg via INTRAVENOUS

## 2016-12-09 MED ORDER — PROMETHAZINE HCL 25 MG/ML IJ SOLN
25.0000 mg | Freq: Once | INTRAMUSCULAR | Status: AC
  Administered 2016-12-09: 25 mg via INTRAVENOUS

## 2016-12-09 MED ORDER — PROMETHAZINE HCL 25 MG/ML IJ SOLN
INTRAMUSCULAR | Status: AC
  Filled 2016-12-09: qty 1

## 2016-12-09 MED ORDER — HYDROMORPHONE HCL 1 MG/ML IJ SOLN
INTRAMUSCULAR | Status: AC
  Filled 2016-12-09: qty 1

## 2016-12-09 MED ORDER — OMEPRAZOLE 20 MG PO CPDR: 20.0000 mg | DELAYED_RELEASE_CAPSULE | Freq: Every day | ORAL | 0 refills | Status: DC

## 2016-12-09 MED ORDER — IOPAMIDOL (ISOVUE-300) INJECTION 61%
100.0000 mL | Freq: Once | INTRAVENOUS | Status: AC | PRN
Start: 2016-12-09 — End: 2016-12-09
  Administered 2016-12-09: 100 mL via INTRAVENOUS

## 2016-12-09 NOTE — Discharge Instructions (Signed)
Keep yourself hydrated. Take the vomiting medication as prescribed. Followup with your doctor. Return to the ED if you develop new or worsening symptoms.

## 2016-12-09 NOTE — ED Notes (Signed)
Patient sleep at this time.

## 2016-12-09 NOTE — ED Triage Notes (Signed)
Pt here with nausea, vomiting, and diarrhea since 1900.

## 2016-12-09 NOTE — ED Notes (Signed)
Patient yelling out " Nurse, Nurse". Upon entering room patient tossing and turning. Yelling he need something for pain. Explained to patient that Dr was handling an Emergency and he would be to see him as soon as he could. Patient up to restroom, states that he is sick.

## 2016-12-09 NOTE — ED Provider Notes (Signed)
[facility] DEPT Provider Note   CSN: [id number] Arrival date & time: 12/09/16  [id number]    By signing my name below, I, Macon Large, attest that this documentation has been prepared under the direction and in the presence of Ezequiel Essex, MD. Electronically Signed: Macon Large, ED Scribe. 12/09/16. 1:24 AM.  History   Chief Complaint Chief Complaint  Patient presents with  . Abdominal Pain   The history is provided by the patient and the spouse. No language interpreter was used.   HPI Comments: Troy Leon is a 46 y.o. male with PMHx of pancreatitis who presents to the Emergency Department complaining of non-radiating, constant, generalized abdominal pain onset 5:00 pm yesterday. Pt reports associated nausea followed by episodic vomiting and diarrhea. No alleviating factors noted. The wife reports pt has had similar episodes in the past and has been dx'd with pancreatitis. Pt has hx of cholecystectomy. Pt denies prior hx of alcohol use. He also denies blood in stool and fever.  Past Medical History:  Diagnosis Date  . Pancreatitis     Patient Active Problem List   Diagnosis Date Noted  . Abdominal pain 07/18/2013  . Gastroenteritis, acute 07/18/2013  . Dehydration 07/18/2013  . Tobacco abuse 07/18/2013  . Intractable vomiting 01/30/2013  . Abdominal pain, epigastric 01/30/2013  . Alcohol intoxication ([place]) 01/30/2013  . Leukocytosis, unspecified 01/30/2013  . Cigarette smoker 01/30/2013  . Lactic acidosis 01/30/2013    Past Surgical History:  Procedure Laterality Date  . CHOLECYSTECTOMY         Home Medications    Prior to Admission medications   Medication Sig Start Date End Date Taking? Authorizing Provider  HYDROcodone-acetaminophen (HYCET) 7.5-325 mg/15 ml solution Take 10-15 mLs by mouth every 4 (four) hours as needed for moderate pain. 10/13/15   Virgel Manifold, MD  ondansetron (ZOFRAN ODT) 4 MG disintegrating tablet 4mg  ODT q4 hours prn  nausea/vomit 07/31/14   Elnora Morrison, MD  ondansetron (ZOFRAN ODT) 4 MG disintegrating tablet Take 1 tablet (4 mg total) by mouth every 8 (eight) hours as needed for nausea or vomiting. 10/13/15   Virgel Manifold, MD  promethazine (PHENERGAN) 25 MG tablet Take 25 mg by mouth every 6 (six) hours as needed for nausea or vomiting.    Historical Provider, MD  ranitidine (ZANTAC) 150 MG/10ML syrup Take 10 mLs (150 mg total) by mouth at bedtime. 10/13/15   Virgel Manifold, MD    Family History Family History  Problem Relation Age of Onset  . Diabetes Mother   . Diabetes Other   . Heart attack Other   . Hypertension Other     Social History Social History  Substance Use Topics  . Smoking status: Current Every Day Smoker    Types: Cigarettes  . Smokeless tobacco: Never Used  . Alcohol use No     Allergies   Penicillins   Review of Systems Review of Systems  Constitutional: Negative for fever.  Gastrointestinal: Positive for abdominal pain, diarrhea, nausea and vomiting. Negative for blood in stool.    A complete 10 system review of systems was obtained and all systems are negative except as noted in the HPI and PMH.   Physical Exam Updated Vital Signs BP 124/73 (BP Location: Right Arm)   Pulse 102   Temp 98.2 F (36.8 C) (Oral)   Resp 24   Ht 5\' 7"  (1.702 m)   Wt 155 lb (70.3 kg)   SpO2 99%   BMI 24.28 kg/m   Physical Exam  Constitutional: He is oriented to person, place, and time. He appears well-developed and well-nourished.  Anxious, in pain, pale appearing.   HENT:  Head: Normocephalic and atraumatic.  Mouth/Throat: Oropharynx is clear and moist. No oropharyngeal exudate.  Eyes: Conjunctivae and EOM are normal. Pupils are equal, round, and reactive to light.  Neck: Normal range of motion. Neck supple.  No meningismus.  Cardiovascular: Normal rate, regular rhythm, normal heart sounds and intact distal pulses.   No murmur heard. Pulmonary/Chest: Effort normal and  breath sounds normal. No respiratory distress.  Abdominal: Soft. There is no rebound and no guarding.  Diffused abdominal pain, worse in epigastrium.    Genitourinary:  Genitourinary Comments: No hemorrhoids, no fissures, no gross blood  Musculoskeletal: Normal range of motion. He exhibits no edema or tenderness.  Neurological: He is alert and oriented to person, place, and time. No cranial nerve deficit. He exhibits normal muscle tone. Coordination normal.   5/5 strength throughout. CN 2-12 intact.Equal grip strength.   Skin: Skin is warm.  Psychiatric: He has a normal mood and affect. His behavior is normal.  Nursing note and vitals reviewed.    ED Treatments / Results   DIAGNOSTIC STUDIES: Oxygen Saturation is 99% on RA, normal by my interpretation.    COORDINATION OF CARE: 1:13 AM Discussed treatment plan with pt at bedside which includes labs, abdomen with pelvis imaging and nausea medication and pt agreed to plan.   Labs (all labs ordered are listed, but only abnormal results are displayed) Labs Reviewed  CBC WITH DIFFERENTIAL/PLATELET - Abnormal; Notable for the following:       Result Value   WBC 13.2 (*)    Neutro Abs 12.2 (*)    Lymphs Abs 0.4 (*)    All other components within normal limits  COMPREHENSIVE METABOLIC PANEL - Abnormal; Notable for the following:    CO2 21 (*)    Glucose, Bld 218 (*)    Total Bilirubin 2.2 (*)    All other components within normal limits  CBG MONITORING, ED - Abnormal; Notable for the following:    Glucose-Capillary 132 (*)    All other components within normal limits  LIPASE, BLOOD  POC OCCULT BLOOD, ED    EKG  EKG Interpretation None       Radiology Ct Abdomen Pelvis W Contrast  Result Date: 12/09/2016 CLINICAL DATA:  Generalized abdominal pain for 1 day.  Nausea. EXAM: CT ABDOMEN AND PELVIS WITH CONTRAST TECHNIQUE: Multidetector CT imaging of the abdomen and pelvis was performed using the standard protocol following  bolus administration of intravenous contrast. CONTRAST:  133mL ISOVUE-300 IOPAMIDOL (ISOVUE-300) INJECTION 61% COMPARISON:  CT 07/18/2013 FINDINGS: Lower chest: Motion artifact. No consolidation. No definite pleural fluid. Hepatobiliary: Tiny 5 mm hypodensity within the right lobe of the liver, too small to characterize. Postcholecystectomy with mild prominence of central intrahepatic ducts. No evidence of common bile duct dilatation. Pancreas: No ductal dilatation or inflammation. Spleen: Normal in size without focal abnormality. Adrenals/Urinary Tract: No adrenal nodule. No hydronephrosis or perinephric edema. Symmetric enhancement and excretion on delayed phase imaging. Urinary bladder is physiologically distended, no wall thickening. Stomach/Bowel: Stomach is decompressed. No bowel dilatation to suggest obstruction. Scattered fluid with small bowel loops in the lower abdomen and pelvis, with equivocal scattered small bowel thickening. Majority of the colon is decompressed. Appendix tentatively identified and normal, no evidence appendicitis. Vascular/Lymphatic: No significant vascular findings are present. No enlarged abdominal or pelvic lymph nodes. Reproductive: Prostate is unremarkable. Other: Small amount of  simple free fluid in the pelvis. New free air. No loculated abscess. Musculoskeletal: There are no acute or suspicious osseous abnormalities. Facet arthropathy in the lumbar spine. IMPRESSION: 1. Scattered fluid within small bowel and mild wall thickening, nonspecific but suggesting enteritis pattern. Minimal free fluid in the pelvis is likely reactive. 2. Otherwise no acute abnormality. No CT findings of acute pancreatitis. Electronically Signed   By: Jeb Levering M.D.   On: 12/09/2016 03:13    Procedures Procedures (including critical care time)  Medications Ordered in ED Medications  sodium chloride 0.9 % bolus 1,000 mL (1,000 mLs Intravenous New Bag/Given 12/09/16 0115)  sodium chloride  0.9 % bolus 1,000 mL (1,000 mLs Intravenous New Bag/Given 12/09/16 0107)  ondansetron (ZOFRAN) injection 4 mg (4 mg Intravenous Given 12/09/16 0107)  HYDROmorphone (DILAUDID) injection 1 mg (1 mg Intravenous Given 12/09/16 0116)  ondansetron (ZOFRAN) injection 4 mg (4 mg Intravenous Given 12/09/16 0115)     Initial Impression / Assessment and Plan / ED Course  I have reviewed the triage vital signs and the nursing notes.  Pertinent labs & imaging results that were available during my care of the patient were reviewed by me and considered in my medical decision making (see chart for details).     Patient with nausea vomiting diarrhea and abdominal pain similar to previous episodes of pancreatitis. Wife states patient becomes sick whenever he has vomiting his pancreas except. Denies alcohol use.  Labs show normal lipase. Normal LFTs. Previous cholecystectomy.  CT does not show any acute pathology. Does show nonspecific fluid within the small bowel consistent with enteritis.  Patient given IV fluids and IV antiemetics in the ED. Pancreas enzymes are normal. No EGD on file. Labs are reassuring.  Did have some low BP in the ED after receiving phenergan.  HR remained normal and BP responded to fluid.   Patient tolerating PO and feeling improved and requesting discharge. Follow up with PCP. Return precautions discussed.  BP 94/55   Pulse 68   Temp 98.7 F (37.1 C) (Oral)   Resp 18   Ht 5\' 7"  (1.702 m)   Wt 155 lb (70.3 kg)   SpO2 99%   BMI 24.28 kg/m    Final Clinical Impressions(s) / ED Diagnoses   Final diagnoses:  Non-intractable vomiting with nausea, unspecified vomiting type    New Prescriptions New Prescriptions   No medications on file    I personally performed the services described in this documentation, which was scribed in my presence. The recorded information has been reviewed and is accurate.    Ezequiel Essex, MD 12/10/16 [phone]

## 2016-12-09 NOTE — ED Notes (Signed)
Patient reminded that a urine specimen is needed. Patient walked to restroom and back to room, when asked patient states that he forgot. States that he can try later. Patient barely able to keep eyes open when vitals were taken.

## 2016-12-10 ENCOUNTER — Emergency Department (HOSPITAL_COMMUNITY)
Admission: EM | Admit: 2016-12-10 | Discharge: 2016-12-10 | Disposition: A | Discharge status: Home / Self Care | Payer: Managed Care, Other (non HMO) | Attending: Emergency Medicine | Admitting: Emergency Medicine

## 2016-12-10 ENCOUNTER — Encounter (HOSPITAL_COMMUNITY): Payer: Self-pay

## 2016-12-10 DIAGNOSIS — R197 Diarrhea, unspecified: Secondary | ICD-10-CM | POA: Insufficient documentation

## 2016-12-10 DIAGNOSIS — F17210 Nicotine dependence, cigarettes, uncomplicated: Secondary | ICD-10-CM | POA: Insufficient documentation

## 2016-12-10 DIAGNOSIS — R5383 Other fatigue: Secondary | ICD-10-CM | POA: Diagnosis not present

## 2016-12-10 DIAGNOSIS — R1013 Epigastric pain: Secondary | ICD-10-CM | POA: Diagnosis not present

## 2016-12-10 DIAGNOSIS — R112 Nausea with vomiting, unspecified: Secondary | ICD-10-CM | POA: Diagnosis not present

## 2016-12-10 DIAGNOSIS — Z79899 Other long term (current) drug therapy: Secondary | ICD-10-CM | POA: Diagnosis not present

## 2016-12-10 LAB — CBC
HCT: 41.8 % (ref 39.0–52.0)
Hemoglobin: 14.8 g/dL (ref 13.0–17.0)
MCH: 31.9 pg (ref 26.0–34.0)
MCHC: 35.4 g/dL (ref 30.0–36.0)
MCV: 90.1 fL (ref 78.0–100.0)
Platelets: 177 10*3/uL (ref 150–400)
RBC: 4.64 MIL/uL (ref 4.22–5.81)
RDW: 12.5 % (ref 11.5–15.5)
WBC: 10.1 10*3/uL (ref 4.0–10.5)

## 2016-12-10 LAB — COMPREHENSIVE METABOLIC PANEL
ALT: 28 U/L (ref 17–63)
AST: 33 U/L (ref 15–41)
Albumin: 3.9 g/dL (ref 3.5–5.0)
Alkaline Phosphatase: 35 U/L — ABNORMAL LOW (ref 38–126)
Anion gap: 14 (ref 5–15)
BUN: 18 mg/dL (ref 6–20)
CO2: 20 mmol/L — ABNORMAL LOW (ref 22–32)
Calcium: 9.1 mg/dL (ref 8.9–10.3)
Chloride: 103 mmol/L (ref 101–111)
Creatinine, Ser: 0.96 mg/dL (ref 0.61–1.24)
GFR calc Af Amer: >60 mL/min (ref >60)
GFR calc non Af Amer: >60 mL/min (ref >60)
Glucose, Bld: 121 mg/dL — ABNORMAL HIGH (ref 65–99)
Potassium: 3.8 mmol/L (ref 3.5–5.1)
Sodium: 137 mmol/L (ref 135–145)
Total Bilirubin: 1.1 mg/dL (ref 0.3–1.2)
Total Protein: 6.7 g/dL (ref 6.5–8.1)

## 2016-12-10 LAB — LIPASE, BLOOD: Lipase: 27 U/L (ref 11–51)

## 2016-12-10 MED ORDER — FENTANYL CITRATE (PF) 100 MCG/2ML IJ SOLN
50.0000 ug | INTRAMUSCULAR | Status: DC | PRN
  Administered 2016-12-10 (×2): 50 ug via INTRAVENOUS
  Filled 2016-12-10 (×3): qty 2

## 2016-12-10 MED ORDER — METOCLOPRAMIDE HCL 10 MG PO TABS: 10.0000 mg | ORAL_TABLET | Freq: Four times a day (QID) | ORAL | 0 refills | Status: DC | PRN

## 2016-12-10 MED ORDER — PROMETHAZINE HCL 25 MG/ML IJ SOLN
25.0000 mg | Freq: Once | INTRAMUSCULAR | Status: AC
  Administered 2016-12-10: 25 mg via INTRAVENOUS
  Filled 2016-12-10: qty 1

## 2016-12-10 MED ORDER — SODIUM CHLORIDE 0.9 % IV BOLUS (SEPSIS)
1000.0000 mL | Freq: Once | INTRAVENOUS | Status: AC
  Administered 2016-12-10: 1000 mL via INTRAVENOUS

## 2016-12-10 MED ORDER — HALOPERIDOL LACTATE 5 MG/ML IJ SOLN
2.0000 mg | Freq: Once | INTRAMUSCULAR | Status: AC
  Administered 2016-12-10: 2 mg via INTRAVENOUS
  Filled 2016-12-10: qty 1

## 2016-12-10 MED ORDER — KETOROLAC TROMETHAMINE 30 MG/ML IJ SOLN
15.0000 mg | Freq: Once | INTRAMUSCULAR | Status: AC
  Administered 2016-12-10: 15 mg via INTRAVENOUS
  Filled 2016-12-10: qty 1

## 2016-12-10 MED ORDER — METOCLOPRAMIDE HCL 5 MG/ML IJ SOLN
10.0000 mg | Freq: Once | INTRAMUSCULAR | Status: AC
Start: 2016-12-10 — End: 2016-12-10
  Administered 2016-12-10: 10 mg via INTRAVENOUS
  Filled 2016-12-10: qty 2

## 2016-12-10 MED ORDER — ONDANSETRON HCL 4 MG/2ML IJ SOLN
4.0000 mg | Freq: Once | INTRAMUSCULAR | Status: AC
  Administered 2016-12-10: 4 mg via INTRAVENOUS
  Filled 2016-12-10: qty 2

## 2016-12-10 NOTE — ED Triage Notes (Signed)
Pt reports that he was discharged from hospital on Sunday and conts with abdominal pain, nausea and vomiting. Taking zofran without relief

## 2016-12-10 NOTE — Discharge Instructions (Signed)
If you were given medicines take as directed.  If you are on coumadin or contraceptives realize their levels and effectiveness is altered by many different medicines.  If you have any reaction (rash, tongues swelling, other) to the medicines stop taking and see a physician.    If your blood pressure was elevated in the ER make sure you follow up for management with a primary doctor or return for chest pain, shortness of breath or stroke symptoms.  Please follow up as directed and return to the ER or see a physician for new or worsening symptoms.  Thank you. Vitals:   12/10/16 0830 12/10/16 0900 12/10/16 0930 12/10/16 1000  BP: 114/64 119/60 124/67 126/72  Pulse: (!) 58 (!) 49  70  Resp:      Temp:      TempSrc:      SpO2: 100% 100%  100%  Weight:      Height:

## 2016-12-10 NOTE — ED Provider Notes (Signed)
[facility] DEPT Provider Note   CSN: [id number] Arrival date & time: 12/10/16  0645     History   Chief Complaint Chief Complaint  Patient presents with  . Abdominal Pain    HPI Troy Leon is a 46 y.o. male.  Patient with history of pancreatitis non-alcohol-related, tobacco abuse, cigarette smoking, gastroenteritis presents with recurrent nausea vomiting diarrhea since Sunday. Patient seen yesterday and had CT scan blood work performed which were unremarkable. Patient had worsening symptoms diffuse abdominal cramping nonbloody vomiting and diarrhea since discharge.      Past Medical History:  Diagnosis Date  . Pancreatitis     Patient Active Problem List   Diagnosis Date Noted  . Abdominal pain 07/18/2013  . Gastroenteritis, acute 07/18/2013  . Dehydration 07/18/2013  . Tobacco abuse 07/18/2013  . Intractable vomiting 01/30/2013  . Abdominal pain, epigastric 01/30/2013  . Alcohol intoxication ([place]) 01/30/2013  . Leukocytosis, unspecified 01/30/2013  . Cigarette smoker 01/30/2013  . Lactic acidosis 01/30/2013    Past Surgical History:  Procedure Laterality Date  . CHOLECYSTECTOMY         Home Medications    Prior to Admission medications   Medication Sig Start Date End Date Taking? Authorizing Provider  Dextrose-Fructose-Sod Citrate (NAUZENE) [phone] MG CHEW Chew 1 tablet by mouth daily as needed (nausea).   Yes Historical Provider, MD  omeprazole (PRILOSEC) 20 MG capsule Take 1 capsule (20 mg total) by mouth daily. 12/09/16  Yes Ezequiel Essex, MD  ondansetron (ZOFRAN) 4 MG tablet Take 1 tablet (4 mg total) by mouth every 6 (six) hours. 12/09/16  Yes Ezequiel Essex, MD  metoCLOPramide (REGLAN) 10 MG tablet Take 1 tablet (10 mg total) by mouth every 6 (six) hours as needed for nausea (nausea/headache). 12/10/16   Elnora Morrison, MD    Family History Family History  Problem Relation Age of Onset  . Diabetes Mother   . Diabetes Other   . Heart  attack Other   . Hypertension Other     Social History Social History  Substance Use Topics  . Smoking status: Current Every Day Smoker    Types: Cigarettes  . Smokeless tobacco: Never Used  . Alcohol use No     Allergies   Penicillins   Review of Systems Review of Systems  Constitutional: Positive for appetite change and fatigue. Negative for chills and fever.  HENT: Negative for ear pain and sore throat.   Eyes: Negative for pain and visual disturbance.  Respiratory: Negative for cough and shortness of breath.   Cardiovascular: Negative for chest pain and palpitations.  Gastrointestinal: Positive for abdominal pain, diarrhea and vomiting. Negative for blood in stool.  Genitourinary: Negative for dysuria and hematuria.  Musculoskeletal: Negative for arthralgias and back pain.  Skin: Negative for color change and rash.  Neurological: Negative for seizures and syncope.  All other systems reviewed and are negative.    Physical Exam Updated Vital Signs BP 126/72   Pulse 70   Temp 97.8 F (36.6 C) (Oral)   Resp 24   Ht 5\' 7"  (1.702 m)   Wt 155 lb (70.3 kg)   SpO2 100%   BMI 24.28 kg/m   Physical Exam  Constitutional: He appears well-developed and well-nourished.  HENT:  Head: Normocephalic and atraumatic.  Dry mm  Eyes: Conjunctivae are normal.  Neck: Neck supple.  Cardiovascular: Normal rate and regular rhythm.   No murmur heard. Pulmonary/Chest: Effort normal and breath sounds normal. No respiratory distress.  Abdominal: Soft. There is  tenderness (mild central and epig). There is no guarding.  Musculoskeletal: He exhibits no edema.  Neurological: He is alert.  Skin: Skin is warm and dry.  Psychiatric: He has a normal mood and affect.  Nursing note and vitals reviewed.    ED Treatments / Results  Labs (all labs ordered are listed, but only abnormal results are displayed) Labs Reviewed  COMPREHENSIVE METABOLIC PANEL - Abnormal; Notable for the  following:       Result Value   CO2 20 (*)    Glucose, Bld 121 (*)    Alkaline Phosphatase 35 (*)    All other components within normal limits  LIPASE, BLOOD  CBC  URINALYSIS, ROUTINE W REFLEX MICROSCOPIC    EKG  EKG Interpretation None       Radiology Ct Abdomen Pelvis W Contrast  Result Date: 12/09/2016 CLINICAL DATA:  Generalized abdominal pain for 1 day.  Nausea. EXAM: CT ABDOMEN AND PELVIS WITH CONTRAST TECHNIQUE: Multidetector CT imaging of the abdomen and pelvis was performed using the standard protocol following bolus administration of intravenous contrast. CONTRAST:  136mL ISOVUE-300 IOPAMIDOL (ISOVUE-300) INJECTION 61% COMPARISON:  CT 07/18/2013 FINDINGS: Lower chest: Motion artifact. No consolidation. No definite pleural fluid. Hepatobiliary: Tiny 5 mm hypodensity within the right lobe of the liver, too small to characterize. Postcholecystectomy with mild prominence of central intrahepatic ducts. No evidence of common bile duct dilatation. Pancreas: No ductal dilatation or inflammation. Spleen: Normal in size without focal abnormality. Adrenals/Urinary Tract: No adrenal nodule. No hydronephrosis or perinephric edema. Symmetric enhancement and excretion on delayed phase imaging. Urinary bladder is physiologically distended, no wall thickening. Stomach/Bowel: Stomach is decompressed. No bowel dilatation to suggest obstruction. Scattered fluid with small bowel loops in the lower abdomen and pelvis, with equivocal scattered small bowel thickening. Majority of the colon is decompressed. Appendix tentatively identified and normal, no evidence appendicitis. Vascular/Lymphatic: No significant vascular findings are present. No enlarged abdominal or pelvic lymph nodes. Reproductive: Prostate is unremarkable. Other: Small amount of simple free fluid in the pelvis. New free air. No loculated abscess. Musculoskeletal: There are no acute or suspicious osseous abnormalities. Facet arthropathy in the  lumbar spine. IMPRESSION: 1. Scattered fluid within small bowel and mild wall thickening, nonspecific but suggesting enteritis pattern. Minimal free fluid in the pelvis is likely reactive. 2. Otherwise no acute abnormality. No CT findings of acute pancreatitis. Electronically Signed   By: Jeb Levering M.D.   On: 12/09/2016 03:13    Procedures Procedures (including critical care time)  Medications Ordered in ED Medications  fentaNYL (SUBLIMAZE) injection 50 mcg (50 mcg Intravenous Given 12/10/16 1024)  sodium chloride 0.9 % bolus 1,000 mL (0 mLs Intravenous Stopped 12/10/16 0842)  metoCLOPramide (REGLAN) injection 10 mg (10 mg Intravenous Given 12/10/16 0735)  ondansetron (ZOFRAN) injection 4 mg (4 mg Intravenous Given 12/10/16 0803)  haloperidol lactate (HALDOL) injection 2 mg (2 mg Intravenous Given 12/10/16 0842)  sodium chloride 0.9 % bolus 1,000 mL (0 mLs Intravenous Stopped 12/10/16 0940)  ketorolac (TORADOL) 30 MG/ML injection 15 mg (15 mg Intravenous Given 12/10/16 0932)  promethazine (PHENERGAN) injection 25 mg (25 mg Intravenous Given 12/10/16 0932)     Initial Impression / Assessment and Plan / ED Course  I have reviewed the triage vital signs and the nursing notes.  Pertinent labs & imaging results that were available during my care of the patient were reviewed by me and considered in my medical decision making (see chart for details).   patient presents with recurrent nausea  vomiting diarrhea. Abdominal exam overall benign patient requiring multiple antiemetics. Multiple IV fluid boluses ordered. Reviewed recent CT scan results nonspecific findings.  Pt improved in ED.   Results and differential diagnosis were discussed with the patient/parent/guardian. Xrays were independently reviewed by myself.  Close follow up outpatient was discussed.  No indication for admission at this time.   Medications  fentaNYL (SUBLIMAZE) injection 50 mcg (50 mcg Intravenous Given 12/10/16 1024)    sodium chloride 0.9 % bolus 1,000 mL (0 mLs Intravenous Stopped 12/10/16 0842)  metoCLOPramide (REGLAN) injection 10 mg (10 mg Intravenous Given 12/10/16 0735)  ondansetron (ZOFRAN) injection 4 mg (4 mg Intravenous Given 12/10/16 0803)  haloperidol lactate (HALDOL) injection 2 mg (2 mg Intravenous Given 12/10/16 0842)  sodium chloride 0.9 % bolus 1,000 mL (0 mLs Intravenous Stopped 12/10/16 0940)  ketorolac (TORADOL) 30 MG/ML injection 15 mg (15 mg Intravenous Given 12/10/16 0932)  promethazine (PHENERGAN) injection 25 mg (25 mg Intravenous Given 12/10/16 0932)    Vitals:   12/10/16 0830 12/10/16 0900 12/10/16 0930 12/10/16 1000  BP: 114/64 119/60 124/67 126/72  Pulse: (!) 58 (!) 49  70  Resp:      Temp:      TempSrc:      SpO2: 100% 100%  100%  Weight:      Height:        Final diagnoses:  Nausea vomiting and diarrhea     Final Clinical Impressions(s) / ED Diagnoses   Final diagnoses:  Nausea vomiting and diarrhea    New Prescriptions New Prescriptions   METOCLOPRAMIDE (REGLAN) 10 MG TABLET    Take 1 tablet (10 mg total) by mouth every 6 (six) hours as needed for nausea (nausea/headache).     Elnora Morrison, MD 12/10/16 [phone]

## 2016-12-16 ENCOUNTER — Encounter: Payer: Self-pay | Admitting: Family Medicine

## 2016-12-16 ENCOUNTER — Ambulatory Visit (INDEPENDENT_AMBULATORY_CARE_PROVIDER_SITE_OTHER): Payer: Managed Care, Other (non HMO) | Admitting: Family Medicine

## 2016-12-16 VITALS — BP 100/80 | Temp 98.4°F | Wt 129.0 lb

## 2016-12-16 DIAGNOSIS — R112 Nausea with vomiting, unspecified: Secondary | ICD-10-CM | POA: Diagnosis not present

## 2016-12-16 DIAGNOSIS — R1013 Epigastric pain: Secondary | ICD-10-CM

## 2016-12-16 MED ORDER — PROMETHAZINE HCL 25 MG PO TABS: ORAL_TABLET | ORAL | 2 refills | Status: DC

## 2016-12-16 MED ORDER — ONDANSETRON HCL 8 MG PO TABS: 8.0000 mg | ORAL_TABLET | Freq: Three times a day (TID) | ORAL | 2 refills | Status: DC | PRN

## 2016-12-16 NOTE — Progress Notes (Signed)
   Subjective:    Patient ID: Troy Leon, male    DOB: 30-Sep-1971, 46 y.o.   MRN: [id number]  HPI Patient is here today for ER follow up visit for nausea and diarrhea. Onset 8 days ago. Patient has been seen at [facility] ER and [facility] ER recently. Nausea has not improved.  This patient has had a long history of intermittent nausea and vomiting and epigastric pain ill histamine spells last several days at a time and get better he's had go to the ER numerous times is had lab work and CAT scans. This has shown some slight elevation of lipase is also shown no tumors or growths. Patient does smoke. Review of Systems Relates epigastric pain nausea vomiting no diarrhea denies sweats chills fever cough wheezing. Relates epigastric pain    Objective:   Physical Exam Neck no masses mucous membranes moist lungs are clear no crackles heart is regular abdomen soft with subjective tenderness in the epigastric region no guarding rebound does not appear to be dehydrated       Assessment & Plan:  Persistent reoccurring nausea with vomiting along with weight loss Recent CT exam does not show any tumor Does need repeat lab work Does need urgent referral to GI may need EGD possible gastric emptying study Could have cyclical vomiting syndrome Zofran Phenergan as necessary to help with the nausea

## 2016-12-17 ENCOUNTER — Encounter: Payer: Self-pay | Admitting: Gastroenterology

## 2016-12-17 LAB — CBC WITH DIFFERENTIAL/PLATELET
Basophils Absolute: 0 cells/uL (ref 0–200)
Basophils Relative: 0 %
Eosinophils Absolute: 0 cells/uL — ABNORMAL LOW (ref 15–500)
Eosinophils Relative: 0 %
HCT: 45.4 % (ref 38.5–50.0)
Hemoglobin: 15.8 g/dL (ref 13.2–17.1)
Lymphocytes Relative: 26 %
Lymphs Abs: 2184 cells/uL (ref 850–3900)
MCH: 31.3 pg (ref 27.0–33.0)
MCHC: 34.8 g/dL (ref 32.0–36.0)
MCV: 90.1 fL (ref 80.0–100.0)
MPV: 11.2 fL (ref 7.5–12.5)
Monocytes Absolute: 756 cells/uL (ref 200–950)
Monocytes Relative: 9 %
Neutro Abs: 5460 cells/uL (ref 1500–7800)
Neutrophils Relative %: 65 %
Platelets: 273 10*3/uL (ref 140–400)
RBC: 5.04 MIL/uL (ref 4.20–5.80)
RDW: 13.3 % (ref 11.0–15.0)
WBC: 8.4 10*3/uL (ref 3.8–10.8)

## 2016-12-17 LAB — BASIC METABOLIC PANEL
BUN: 14 mg/dL (ref 7–25)
CO2: 28 mmol/L (ref 20–31)
Calcium: 9.6 mg/dL (ref 8.6–10.3)
Chloride: 99 mmol/L (ref 98–110)
Creat: 0.8 mg/dL (ref 0.60–1.35)
Glucose, Bld: 98 mg/dL (ref 65–99)
Potassium: 3.9 mmol/L (ref 3.5–5.3)
Sodium: 137 mmol/L (ref 135–146)

## 2016-12-17 LAB — HEPATIC FUNCTION PANEL
ALT: 34 U/L (ref 9–46)
AST: 19 U/L (ref 10–40)
Albumin: 4.4 g/dL (ref 3.6–5.1)
Alkaline Phosphatase: 40 U/L (ref 40–115)
Bilirubin, Direct: 0.5 mg/dL — ABNORMAL HIGH (ref <=0.2)
Indirect Bilirubin: 2 mg/dL — ABNORMAL HIGH (ref 0.2–1.2)
Total Bilirubin: 2.5 mg/dL — ABNORMAL HIGH (ref 0.2–1.2)
Total Protein: 6.8 g/dL (ref 6.1–8.1)

## 2016-12-17 LAB — LIPASE: Lipase: 42 U/L (ref 7–60)

## 2016-12-17 LAB — SEDIMENTATION RATE: Sed Rate: 1 mm/hr (ref 0–15)

## 2017-01-06 ENCOUNTER — Ambulatory Visit: Payer: Managed Care, Other (non HMO) | Admitting: Nurse Practitioner

## 2017-01-06 ENCOUNTER — Telehealth: Payer: Self-pay | Admitting: Nurse Practitioner

## 2017-01-06 NOTE — Telephone Encounter (Signed)
Pt was a no show

## 2017-01-07 NOTE — Telephone Encounter (Signed)
Noted  

## 2018-01-22 ENCOUNTER — Encounter: Payer: Self-pay | Admitting: Family Medicine

## 2018-01-22 ENCOUNTER — Ambulatory Visit: Payer: No Typology Code available for payment source | Admitting: Family Medicine

## 2018-01-22 VITALS — BP 94/62 | Temp 98.0°F | Ht 67.0 in | Wt 130.0 lb

## 2018-01-22 DIAGNOSIS — Z23 Encounter for immunization: Secondary | ICD-10-CM | POA: Diagnosis not present

## 2018-01-22 DIAGNOSIS — L089 Local infection of the skin and subcutaneous tissue, unspecified: Secondary | ICD-10-CM

## 2018-01-22 DIAGNOSIS — S60941A Unspecified superficial injury of left index finger, initial encounter: Secondary | ICD-10-CM | POA: Diagnosis not present

## 2018-01-22 DIAGNOSIS — T148XXA Other injury of unspecified body region, initial encounter: Secondary | ICD-10-CM

## 2018-01-22 MED ORDER — DOXYCYCLINE HYCLATE 100 MG PO TABS: 100.0000 mg | ORAL_TABLET | Freq: Two times a day (BID) | ORAL | 0 refills | Status: DC

## 2018-01-22 NOTE — Progress Notes (Signed)
   Subjective:    Patient ID: Troy Leon, male    DOB: 03/15/1971, 47 y.o.   MRN: [id number]  HPI  Patient is here today with complaints of a sore and swollen left index finger. States he poked it yesterday with some wire. Woke this am with it swollen and unable to move it.Put peroxide on it. 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 cause pain discomfort redness swelling over the past 24 hours denies any wheezing difficulty breathing high fever chills nausea vomiting diarrhea Review of Systems Please see above    Objective:   Physical Exam   Lungs clear respiratory rate normal heart regular no murmurs pulse normal extremities no edema skin warm dry has what appears to be infected finger but the hand does not appear to be infected     Assessment & Plan:  Finger cellulitis Antibiotics Warm compresses No sign of abscess Tetanus shot today Warning signs discussed in detail Follow-up if progressive troubles If infection spreads into the main part of the hand go to the ER

## 2019-05-08 ENCOUNTER — Other Ambulatory Visit: Payer: Self-pay

## 2019-05-08 ENCOUNTER — Emergency Department (HOSPITAL_COMMUNITY): Payer: Medicaid Other

## 2019-05-08 ENCOUNTER — Emergency Department (HOSPITAL_COMMUNITY)
Admission: EM | Admit: 2019-05-08 | Discharge: 2019-05-08 | Disposition: A | Discharge status: Home / Self Care | Payer: Medicaid Other | Attending: Emergency Medicine | Admitting: Emergency Medicine

## 2019-05-08 ENCOUNTER — Encounter (HOSPITAL_COMMUNITY): Payer: Self-pay | Admitting: Emergency Medicine

## 2019-05-08 DIAGNOSIS — R112 Nausea with vomiting, unspecified: Secondary | ICD-10-CM | POA: Diagnosis not present

## 2019-05-08 DIAGNOSIS — R1013 Epigastric pain: Secondary | ICD-10-CM | POA: Diagnosis not present

## 2019-05-08 DIAGNOSIS — R1110 Vomiting, unspecified: Secondary | ICD-10-CM | POA: Insufficient documentation

## 2019-05-08 DIAGNOSIS — F17210 Nicotine dependence, cigarettes, uncomplicated: Secondary | ICD-10-CM | POA: Diagnosis not present

## 2019-05-08 LAB — CBC WITH DIFFERENTIAL/PLATELET
Abs Immature Granulocytes: 0.04 10*3/uL (ref 0.00–0.07)
Basophils Absolute: 0 10*3/uL (ref 0.0–0.1)
Basophils Relative: 0 %
Eosinophils Absolute: 0 10*3/uL (ref 0.0–0.5)
Eosinophils Relative: 0 %
HCT: 46.8 % (ref 39.0–52.0)
Hemoglobin: 16.5 g/dL (ref 13.0–17.0)
Immature Granulocytes: 0 %
Lymphocytes Relative: 10 %
Lymphs Abs: 1.3 10*3/uL (ref 0.7–4.0)
MCH: 31.5 pg (ref 26.0–34.0)
MCHC: 35.3 g/dL (ref 30.0–36.0)
MCV: 89.5 fL (ref 80.0–100.0)
Monocytes Absolute: 0.5 10*3/uL (ref 0.1–1.0)
Monocytes Relative: 4 %
Neutro Abs: 11.1 10*3/uL — ABNORMAL HIGH (ref 1.7–7.7)
Neutrophils Relative %: 86 %
Platelets: 189 10*3/uL (ref 150–400)
RBC: 5.23 MIL/uL (ref 4.22–5.81)
RDW: 12 % (ref 11.5–15.5)
WBC: 12.9 10*3/uL — ABNORMAL HIGH (ref 4.0–10.5)
nRBC: 0 % (ref 0.0–0.2)

## 2019-05-08 LAB — COMPREHENSIVE METABOLIC PANEL
ALT: 27 U/L (ref 0–44)
AST: 25 U/L (ref 15–41)
Albumin: 4.9 g/dL (ref 3.5–5.0)
Alkaline Phosphatase: 66 U/L (ref 38–126)
Anion gap: 19 — ABNORMAL HIGH (ref 5–15)
BUN: 26 mg/dL — ABNORMAL HIGH (ref 6–20)
CO2: 21 mmol/L — ABNORMAL LOW (ref 22–32)
Calcium: 10 mg/dL (ref 8.9–10.3)
Chloride: 99 mmol/L (ref 98–111)
Creatinine, Ser: 1 mg/dL (ref 0.61–1.24)
GFR calc Af Amer: >60 mL/min (ref >60)
GFR calc non Af Amer: >60 mL/min (ref >60)
Glucose, Bld: 129 mg/dL — ABNORMAL HIGH (ref 70–99)
Potassium: 3.6 mmol/L (ref 3.5–5.1)
Sodium: 139 mmol/L (ref 135–145)
Total Bilirubin: 2.1 mg/dL — ABNORMAL HIGH (ref 0.3–1.2)
Total Protein: 8 g/dL (ref 6.5–8.1)

## 2019-05-08 LAB — URINALYSIS, ROUTINE W REFLEX MICROSCOPIC
Bilirubin Urine: NEGATIVE
Glucose, UA: NEGATIVE mg/dL
Hgb urine dipstick: NEGATIVE
Ketones, ur: 20 mg/dL — AB
Leukocytes,Ua: NEGATIVE
Nitrite: NEGATIVE
Protein, ur: 100 mg/dL — AB
Specific Gravity, Urine: 1.031 — ABNORMAL HIGH (ref 1.005–1.030)
pH: 8 (ref 5.0–8.0)

## 2019-05-08 LAB — LIPASE, BLOOD: Lipase: 35 U/L (ref 11–51)

## 2019-05-08 IMAGING — CT CT ABDOMEN AND PELVIS WITH CONTRAST
2 of 5 series · 16 of 46 positions shown, 18 images · IV contrast (Isovue)
Comparison: CT scan dated [DATE]

CLINICAL DATA: Progressive abdominal pain with nausea and vomiting.

EXAM:
CT ABDOMEN AND PELVIS WITH CONTRAST
TECHNIQUE: Multidetector CT imaging of the abdomen and pelvis was performed
using the standard protocol following bolus administration of
intravenous contrast.
CONTRAST:  100mL OMNIPAQUE IOHEXOL 300 MG/ML  SOLN

[Series 2: axial st · axial · 0.62mm/px · z∈[+905,+1285]mm · 13 of 88 slices shown, 15 images]
[im 6/88  soft-tissue]
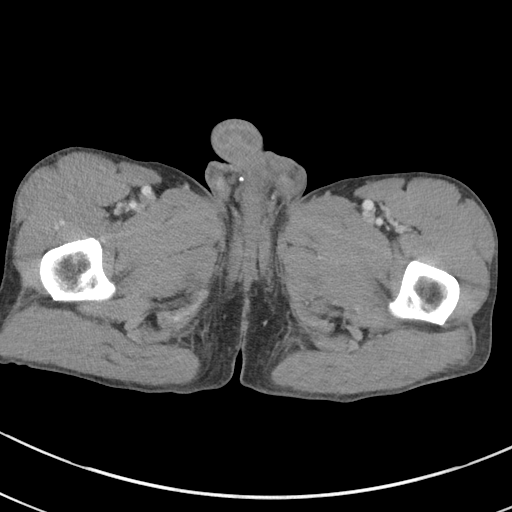
[im 6/88  bone]
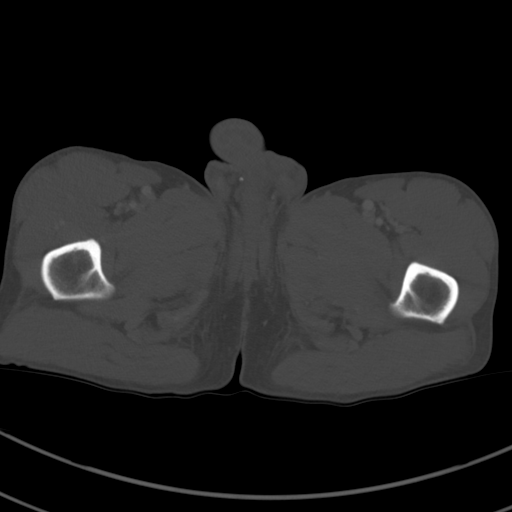
[im 12/88  soft-tissue]
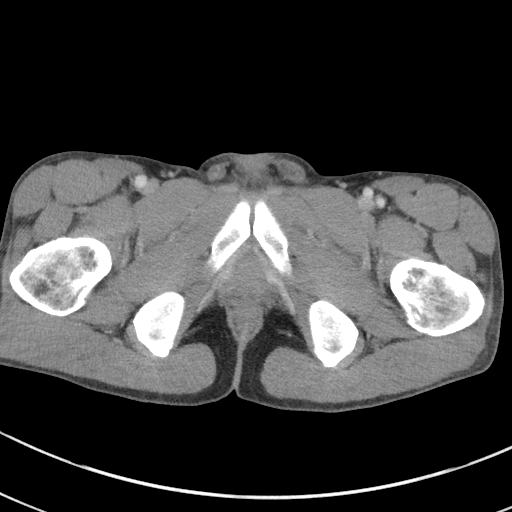
[im 18/88  soft-tissue]
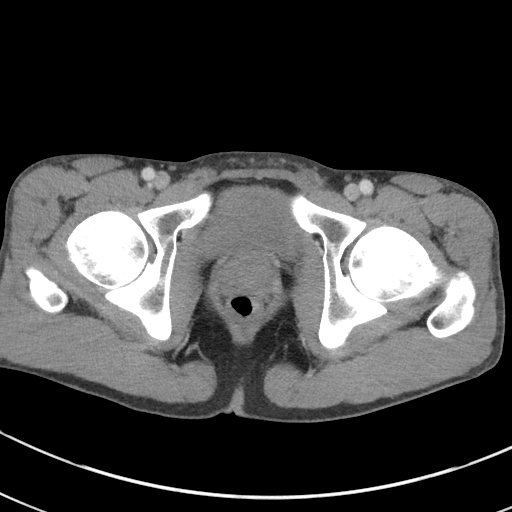
[im 24/88  soft-tissue]
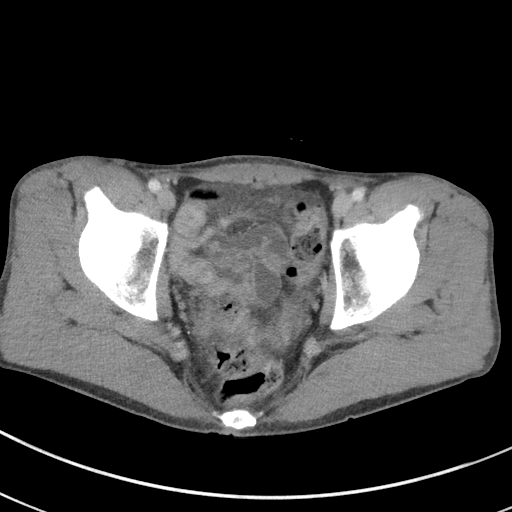
[im 30/88  soft-tissue]
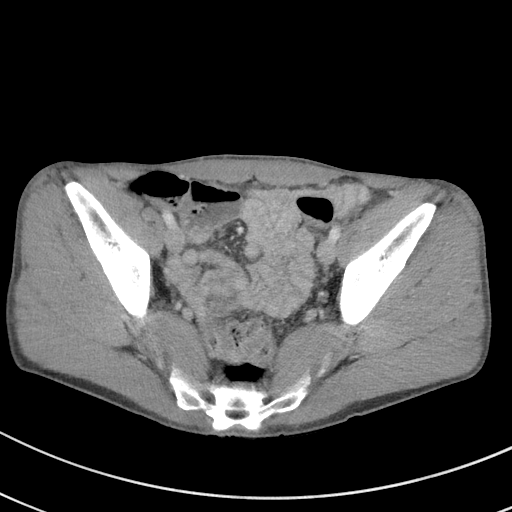
[im 35/88  soft-tissue]
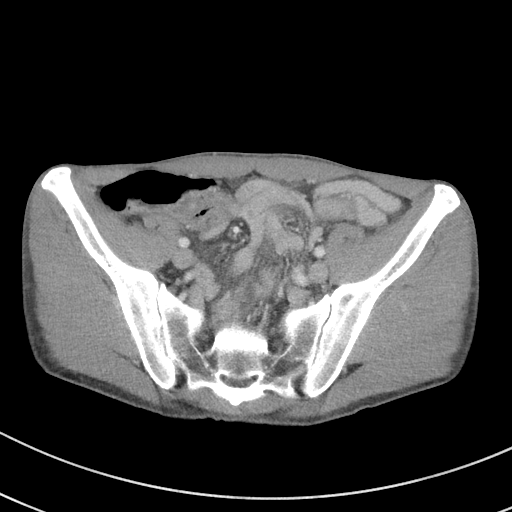
[im 47/88  soft-tissue]
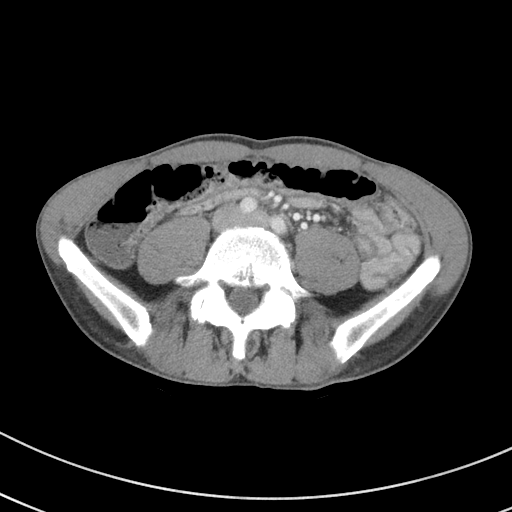
[im 53/88  soft-tissue]
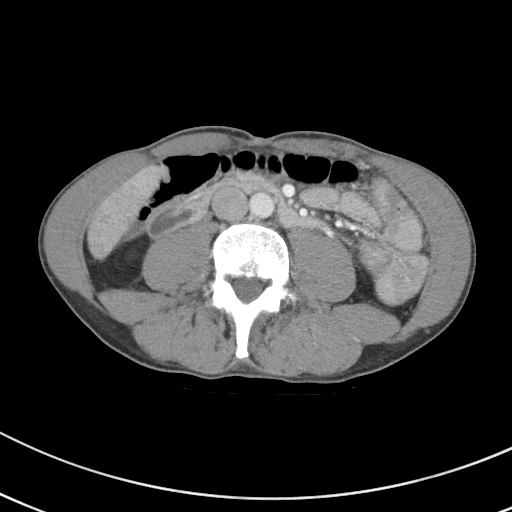
[im 59/88  soft-tissue]
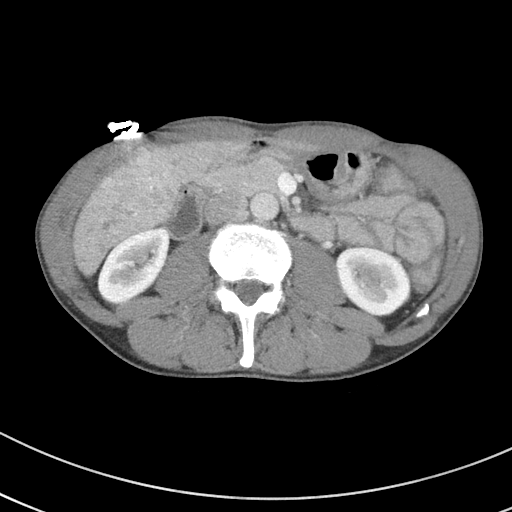
[im 59/88  bone]
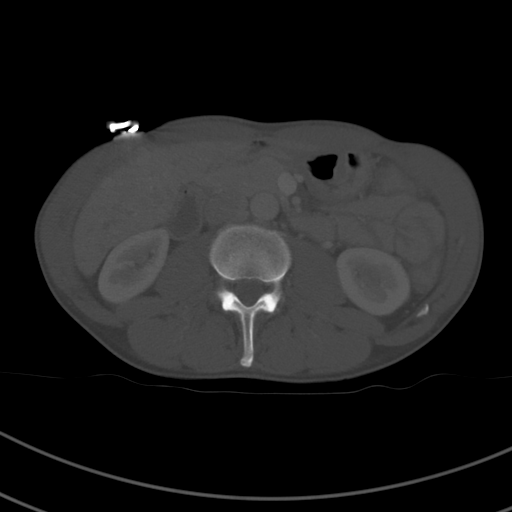
[im 64/88  soft-tissue]
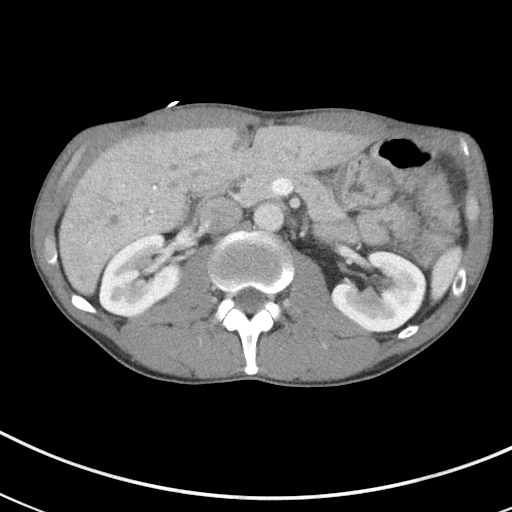
[im 70/88  soft-tissue]
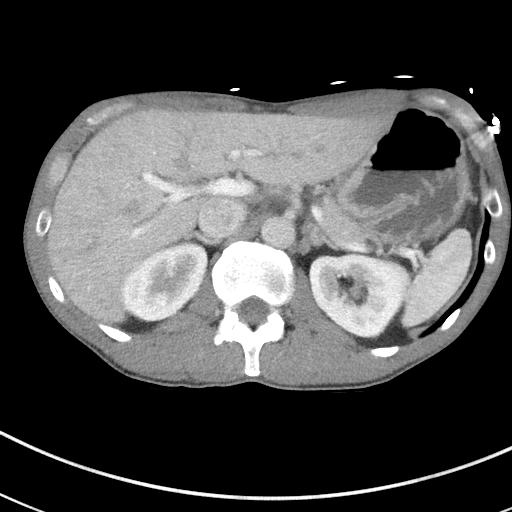
[im 76/88  soft-tissue]
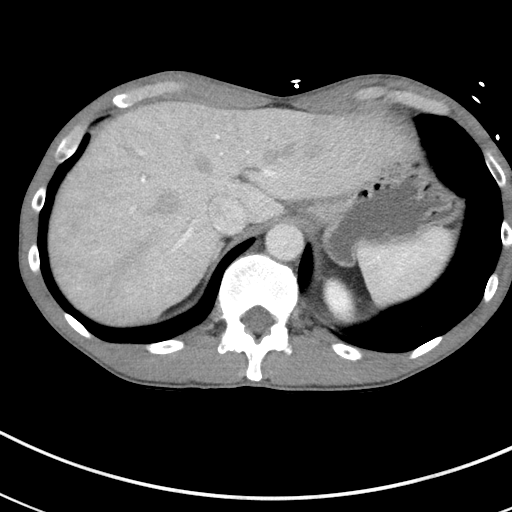
[im 82/88  soft-tissue]
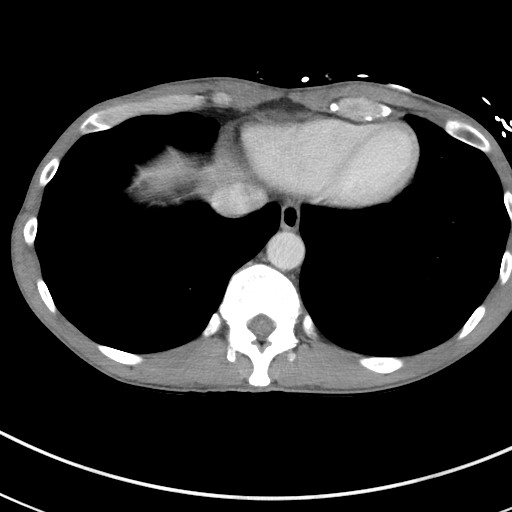

[Series 6: coronal st · coronal · 0.70mm/px · 3 of 85 slices shown]
[im 29/85  soft-tissue]
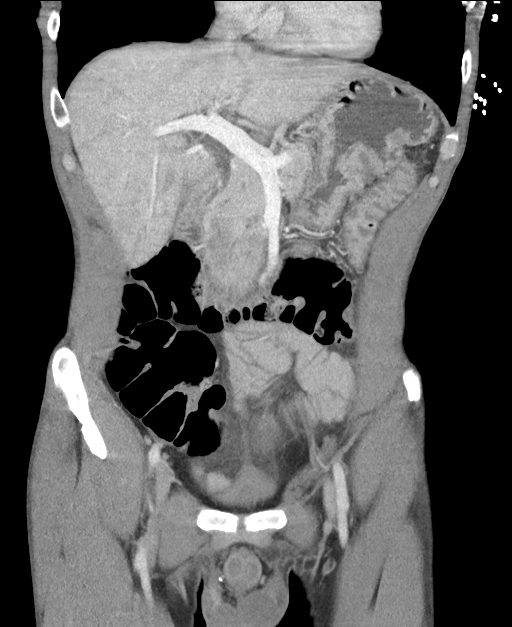
[im 38/85  soft-tissue]
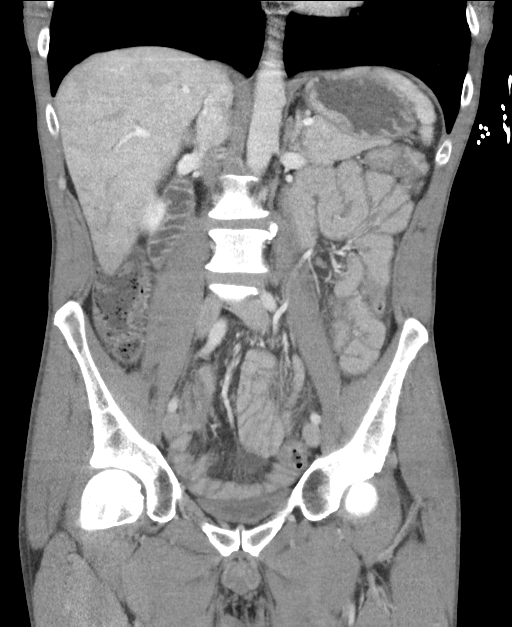
[im 47/85  soft-tissue]
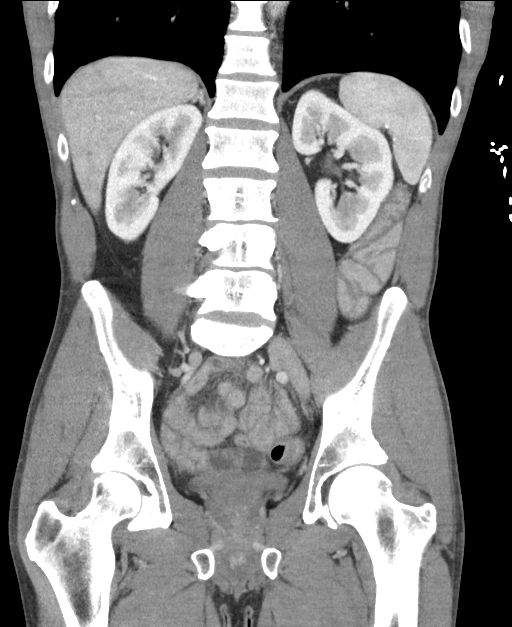

[16 of 46 positions shown; findings below may reference images not displayed]

FINDINGS: Lower chest: Normal.

Hepatobiliary: No focal liver abnormality is seen. Status post
cholecystectomy. No biliary dilatation.

Pancreas: Unremarkable. No pancreatic ductal dilatation or
surrounding inflammatory changes.

Spleen: Normal in size without focal abnormality.

Adrenals/Urinary Tract: Adrenal glands are unremarkable. Kidneys are
normal, without renal calculi, focal lesion, or hydronephrosis.
Bladder is unremarkable.

Stomach/Bowel: Stomach is within normal limits. Appendix appears
normal. No evidence of bowel wall thickening, distention, or
inflammatory changes.

Vascular/Lymphatic: No significant vascular findings are present. No
enlarged abdominal or pelvic lymph nodes.

Reproductive: Prostate is unremarkable.

Other: There is a tiny amount of fluid in the pelvis, similar to the
appearance on the prior study.

Musculoskeletal: No acute or significant osseous findings.
IMPRESSION: Tiny amount of nonspecific free fluid in the pelvis.

Otherwise, benign appearing abdomen.

## 2019-05-08 MED ORDER — PROMETHAZINE HCL 25 MG PO TABS: 25.0000 mg | ORAL_TABLET | Freq: Four times a day (QID) | ORAL | 0 refills | Status: DC | PRN

## 2019-05-08 MED ORDER — ONDANSETRON HCL 4 MG/2ML IJ SOLN
4.0000 mg | Freq: Once | INTRAMUSCULAR | Status: AC | PRN
  Administered 2019-05-08: 4 mg via INTRAVENOUS
  Filled 2019-05-08: qty 2

## 2019-05-08 MED ORDER — MORPHINE SULFATE (PF) 4 MG/ML IV SOLN
4.0000 mg | Freq: Once | INTRAVENOUS | Status: AC
  Administered 2019-05-08: 4 mg via INTRAVENOUS
  Filled 2019-05-08: qty 1

## 2019-05-08 MED ORDER — HYDROMORPHONE HCL 1 MG/ML IJ SOLN
1.0000 mg | Freq: Once | INTRAMUSCULAR | Status: AC
  Administered 2019-05-08: 1 mg via INTRAVENOUS
  Filled 2019-05-08: qty 1

## 2019-05-08 MED ORDER — PROMETHAZINE HCL 25 MG/ML IJ SOLN
12.5000 mg | Freq: Once | INTRAMUSCULAR | Status: AC
  Administered 2019-05-08: 12.5 mg via INTRAVENOUS
  Filled 2019-05-08: qty 1

## 2019-05-08 MED ORDER — IOHEXOL 300 MG/ML  SOLN
100.0000 mL | Freq: Once | INTRAMUSCULAR | Status: AC | PRN
  Administered 2019-05-08: 12:00:00 100 mL via INTRAVENOUS

## 2019-05-08 MED ORDER — SODIUM CHLORIDE 0.9% FLUSH: 3.0000 mL | Freq: Once | INTRAVENOUS | Status: DC

## 2019-05-08 MED ORDER — SODIUM CHLORIDE 0.9 % IV BOLUS
1000.0000 mL | Freq: Once | INTRAVENOUS | Status: AC
  Administered 2019-05-08: 1000 mL via INTRAVENOUS

## 2019-05-08 NOTE — Discharge Instructions (Signed)
[facility] Primary Care Doctor List    Sinda Du MD. Specialty: Pulmonary Disease Contact information: [place]  [place] [address]  [phone]   Tula Nakayama, MD. Specialty: [facility] Medicine Contact information: [address] [place] [address]  [phone]   Sallee Lange, MD. Specialty: [facility] Medicine Contact information: [place]  [place] [address]  [phone]   Rosita Fire, MD Specialty: Internal Medicine Contact information: [place] [address]  [phone]   Delphina Cahill, MD. Specialty: Internal Medicine Contact information: [place] [address]  [phone]    [facility] (Dr. Maudie Mercury) Specialty: Family Medicine Contact information: [place] [address]  [phone]   Leslie Andrea, MD. Specialty: [facility] Medicine Contact information: [place] [address]  [phone]   Asencion Noble, MD. Specialty: Internal Medicine Contact information: [address]  [place] [address]  [place]  [address] [place], [place] [address] [phone]  Services [facility] offers a variety of basic health services.  Services include but are not limited to: Blood pressure checks  Heart rate checks  Blood sugar checks  Urine analysis  Rapid strep tests  Pregnancy tests.  Health education and referrals  People needing more complex services will be directed to a physician online. Using these virtual visits, doctors can evaluate and prescribe medicine and treatments. There will be no medication on-site, though [facility] will help patients fill their prescriptions at little to no cost.   For More information please go  to: [email or web address]  Thank you for letting us take care of you today!  Please obtain all of your results from medical records or have your doctors office obtain the results - share them with your doctor - you should be seen at your doctors office in the next 2 days. Call today to arrange your follow up. Take the medications as prescribed. Please review all of the medicines and only take them if you do not have an allergy to them. Please be aware that if you are taking birth control pills, taking other prescriptions, ESPECIALLY ANTIBIOTICS may make the birth control ineffective - if this is the case, either do not engage in sexual activity or use alternative methods of birth control such as condoms until you have finished the medicine and your family doctor says it is OK to restart them. If you are on a blood thinner such as COUMADIN, be aware that any other medicine that you take may cause the coumadin to either work too much, or not enough - you should have your coumadin level rechecked in next 7 days if this is the case.  ?  It is also a possibility that you have an allergic reaction to any of the medicines that you have been prescribed - Everybody reacts differently to medications and while MOST people have no trouble with most medicines, you may have a reaction such as nausea, vomiting, rash, swelling, shortness of breath. If this is the case, please stop taking the medicine immediately and contact your physician.   If you were given a medication in the ED such as percocet, vicodin, or morphine, be aware that these medicines are sedating and may change your ability to  take care of yourself adequately for several hours after being given this medicines - you should not drive or take care of small children if you were given this medicine in the Emergency Department or if you have been prescribed these types of medicines. ?   You should return to the ER  IMMEDIATELY if you develop severe or worsening symptoms.

## 2019-05-08 NOTE — ED Notes (Signed)
Went to tell pt we needed a urine sample, he stated he couldn't give a urine until he had more pain meds and an IV. Pt currently had an IV. He also stated he "needed to see meds hanging because that's how he gets pain meds". Pt stated he would pee after he had meds.

## 2019-05-08 NOTE — ED Provider Notes (Signed)
[facility] EMERGENCY DEPARTMENT Provider Note   CSN: [id number] Arrival date & time: 05/08/19  5681    History   Chief Complaint Chief Complaint  Patient presents with  . Abdominal Pain    HPI Troy Leon is a 48 y.o. male with a history of intermittent abdominal pain, pancreatitis, gastroenteritis, surgical history significant for cholecystectomy presenting with a 5-day history of abdominal pain associated with nausea and vomiting.  He had diarrhea initially which resolved.  He has been afebrile to his knowledge, denies dysuria, hematemesis, denies back pain, chest pain or shortness of breath.  Per chart, he does have a history of EtOH use, but denies any current alcohol intake.  He has taken Reglan without improvement in his nausea or vomiting, although states has been unable to keep this down.     The history is provided by the patient.    Past Medical History:  Diagnosis Date  . Pancreatitis     Patient Active Problem List   Diagnosis Date Noted  . Abdominal pain 07/18/2013  . Gastroenteritis, acute 07/18/2013  . Dehydration 07/18/2013  . Tobacco abuse 07/18/2013  . Intractable vomiting 01/30/2013  . Abdominal pain, epigastric 01/30/2013  . Alcohol intoxication ([place]) 01/30/2013  . Leukocytosis, unspecified 01/30/2013  . Cigarette smoker 01/30/2013  . Lactic acidosis 01/30/2013    Past Surgical History:  Procedure Laterality Date  . CHOLECYSTECTOMY          Home Medications    Prior to Admission medications   Medication Sig Start Date End Date Taking? Authorizing Provider  Aspirin-Acetaminophen-Caffeine (GOODY HEADACHE PO) Take 1 Package by mouth daily as needed.   Yes [provider]  metoCLOPramide (REGLAN) 10 MG tablet Take 10 mg by mouth once.   Yes [provider]  promethazine (PHENERGAN) 25 MG tablet Take 1 tablet (25 mg total) by mouth every 6 (six) hours as needed for nausea or vomiting. 05/08/19   Noemi Chapel, MD     Family History Family History  Problem Relation Age of Onset  . Diabetes Mother   . Diabetes Other   . Heart attack Other   . Hypertension Other     Social History Social History   Tobacco Use  . Smoking status: Current Every Day Smoker    Packs/day: 0.50    Types: Cigarettes  . Smokeless tobacco: Never Used  Substance Use Topics  . Alcohol use: No  . Drug use: Yes    Types: Marijuana    Comment: occ     Allergies   Penicillins   Review of Systems Review of Systems  Constitutional: Negative for chills and fever.  HENT: Negative for congestion and sore throat.   Eyes: Negative.   Respiratory: Negative for chest tightness and shortness of breath.   Cardiovascular: Negative for chest pain.  Gastrointestinal: Positive for abdominal pain, diarrhea, nausea and vomiting.  Genitourinary: Negative.  Negative for dysuria.  Musculoskeletal: Negative for arthralgias, joint swelling and neck pain.  Skin: Negative.  Negative for rash and wound.  Neurological: Negative for dizziness, weakness, light-headedness, numbness and headaches.  Psychiatric/Behavioral: Negative.      Physical Exam Updated Vital Signs BP 100/62 (BP Location: Right Arm)   Pulse (!) 56   Temp 98.1 F (36.7 C) (Oral)   Resp 20   Ht 5\' 7"  (1.702 m)   Wt 61.2 kg   SpO2 100%   BMI 21.14 kg/m   Physical Exam Vitals signs and nursing note reviewed.  Constitutional:  General: He is in acute distress.     Appearance: He is well-developed.  HENT:     Head: Normocephalic and atraumatic.  Eyes:     Conjunctiva/sclera: Conjunctivae normal.  Neck:     Musculoskeletal: Normal range of motion.  Cardiovascular:     Rate and Rhythm: Normal rate and regular rhythm.     Heart sounds: Normal heart sounds.  Pulmonary:     Effort: Pulmonary effort is normal.     Breath sounds: Normal breath sounds. No wheezing.  Abdominal:     General: Abdomen is flat. Bowel sounds are decreased. There is no  distension.     Palpations: Abdomen is soft.     Tenderness: There is abdominal tenderness in the epigastric area.  Musculoskeletal: Normal range of motion.  Skin:    General: Skin is warm and dry.  Neurological:     Mental Status: He is alert.      ED Treatments / Results  Labs (all labs ordered are listed, but only abnormal results are displayed) Labs Reviewed  COMPREHENSIVE METABOLIC PANEL - Abnormal; Notable for the following components:      Result Value   CO2 21 (*)    Glucose, Bld 129 (*)    BUN 26 (*)    Total Bilirubin 2.1 (*)    Anion gap 19 (*)    All other components within normal limits  URINALYSIS, ROUTINE W REFLEX MICROSCOPIC - Abnormal; Notable for the following components:   Color, Urine AMBER (*)    APPearance CLOUDY (*)    Specific Gravity, Urine 1.031 (*)    Ketones, ur 20 (*)    Protein, ur 100 (*)    Bacteria, UA MANY (*)    All other components within normal limits  CBC WITH DIFFERENTIAL/PLATELET - Abnormal; Notable for the following components:   WBC 12.9 (*)    Neutro Abs 11.1 (*)    All other components within normal limits  LIPASE, BLOOD    EKG EKG Interpretation  Date/Time:  Saturday May 08 2019 10:32:55 EDT Ventricular Rate:  60 PR Interval:    QRS Duration: 88 QT Interval:  478 QTC Calculation: 478 R Axis:   86 Text Interpretation:  Sinus or ectopic atrial rhythm Short PR interval RSR' in V1 or V2, probably normal variant Borderline prolonged QT interval Since last tracing Ectopic atrial rhythm now seen, no other sig changes Confirmed by Noemi Chapel [phone]) on 05/08/2019 10:57:55 AM   Radiology Ct Abdomen Pelvis W Contrast  Result Date: 05/08/2019 CLINICAL DATA:  Progressive abdominal pain with nausea and vomiting. EXAM: CT ABDOMEN AND PELVIS WITH CONTRAST TECHNIQUE: Multidetector CT imaging of the abdomen and pelvis was performed using the standard protocol following bolus administration of intravenous contrast. CONTRAST:  137mL  OMNIPAQUE IOHEXOL 300 MG/ML  SOLN COMPARISON:  CT scan dated 12/09/2016 FINDINGS: Lower chest: Normal. Hepatobiliary: No focal liver abnormality is seen. Status post cholecystectomy. No biliary dilatation. Pancreas: Unremarkable. No pancreatic ductal dilatation or surrounding inflammatory changes. Spleen: Normal in size without focal abnormality. Adrenals/Urinary Tract: Adrenal glands are unremarkable. Kidneys are normal, without renal calculi, focal lesion, or hydronephrosis. Bladder is unremarkable. Stomach/Bowel: Stomach is within normal limits. Appendix appears normal. No evidence of bowel wall thickening, distention, or inflammatory changes. Vascular/Lymphatic: No significant vascular findings are present. No enlarged abdominal or pelvic lymph nodes. Reproductive: Prostate is unremarkable. Other: There is a tiny amount of fluid in the pelvis, similar to the appearance on the prior study. Musculoskeletal: No acute or  significant osseous findings. IMPRESSION: Tiny amount of nonspecific free fluid in the pelvis. Otherwise, benign appearing abdomen. Electronically Signed   By: Lorriane Shire M.D.   On: 05/08/2019 12:47    Procedures Procedures (including critical care time)  Medications Ordered in ED Medications  sodium chloride flush (NS) 0.9 % injection 3 mL (3 mLs Intravenous Not Given 05/08/19 0955)  ondansetron (ZOFRAN) injection 4 mg (4 mg Intravenous Given 05/08/19 1026)  morphine 4 MG/ML injection 4 mg (4 mg Intravenous Given 05/08/19 1027)  HYDROmorphone (DILAUDID) injection 1 mg (1 mg Intravenous Given 05/08/19 0900)  sodium chloride 0.9 % bolus 1,000 mL (0 mLs Intravenous Stopped 05/08/19 1203)  iohexol (OMNIPAQUE) 300 MG/ML solution 100 mL (100 mLs Intravenous Contrast Given 05/08/19 1208)  promethazine (PHENERGAN) injection 12.5 mg (12.5 mg Intravenous Given 05/08/19 1203)     Initial Impression / Assessment and Plan / ED Course  I have reviewed the triage vital signs and the nursing notes.   Pertinent labs & imaging results that were available during my care of the patient were reviewed by me and considered in my medical decision making (see chart for details).        2:28 PM Pt pain free but still with nausea, phenergan added.  He was bradycardic on monitor - 42-45.  States this is chronic finding, has been admitted for this in the past with negative work up.  Frustrated over frequent intermittent episodes of abd pain - usually occurs every few months, can get into hot tub which usually helps, was not helpful here.  Has had for years, never eval by GI, but "brother was" who had similar sx, never could find source.  Denies etoh use.    Imaging and labs stable, urine cx ordered given bacteruria, but no dysuria sx.  Phenergan, f/u pcp for further eval/management of sx.    Final Clinical Impressions(s) / ED Diagnoses   Final diagnoses:  Epigastric pain    ED Discharge Orders         Ordered    promethazine (PHENERGAN) 25 MG tablet  Every 6 hours PRN     05/08/19 1353           Evalee Jefferson, PA-C 05/08/19 1430    Noemi Chapel, MD 05/11/19 [phone]

## 2019-05-08 NOTE — ED Notes (Signed)
Patient transported to CT 

## 2019-05-08 NOTE — ED Triage Notes (Signed)
Patient c/o generalized abd pain that started on Tuesday and is progressively getting worse. Per patient nausea and vomiting. Denies any diarrhea now but states he had some intially. Unsure of any fevers. Denies any urinary symptoms. Patient taking metoclopramide 10mg  with no relief.

## 2019-05-10 ENCOUNTER — Emergency Department (HOSPITAL_COMMUNITY): Payer: Medicaid Other

## 2019-05-10 ENCOUNTER — Other Ambulatory Visit: Payer: Self-pay

## 2019-05-10 ENCOUNTER — Emergency Department (HOSPITAL_COMMUNITY)
Admission: EM | Admit: 2019-05-10 | Discharge: 2019-05-10 | Disposition: A | Discharge status: Home / Self Care | Payer: Medicaid Other | Attending: Emergency Medicine | Admitting: Emergency Medicine

## 2019-05-10 ENCOUNTER — Encounter (HOSPITAL_COMMUNITY): Payer: Self-pay | Admitting: Emergency Medicine

## 2019-05-10 DIAGNOSIS — R112 Nausea with vomiting, unspecified: Secondary | ICD-10-CM | POA: Diagnosis not present

## 2019-05-10 DIAGNOSIS — R1013 Epigastric pain: Secondary | ICD-10-CM | POA: Diagnosis not present

## 2019-05-10 DIAGNOSIS — Z79899 Other long term (current) drug therapy: Secondary | ICD-10-CM | POA: Diagnosis not present

## 2019-05-10 DIAGNOSIS — E876 Hypokalemia: Secondary | ICD-10-CM

## 2019-05-10 DIAGNOSIS — F17210 Nicotine dependence, cigarettes, uncomplicated: Secondary | ICD-10-CM | POA: Diagnosis not present

## 2019-05-10 DIAGNOSIS — R109 Unspecified abdominal pain: Secondary | ICD-10-CM | POA: Diagnosis not present

## 2019-05-10 LAB — COMPREHENSIVE METABOLIC PANEL
ALT: 23 U/L (ref 0–44)
AST: 20 U/L (ref 15–41)
Albumin: 4.3 g/dL (ref 3.5–5.0)
Alkaline Phosphatase: 55 U/L (ref 38–126)
Anion gap: 15 (ref 5–15)
BUN: 20 mg/dL (ref 6–20)
CO2: 20 mmol/L — ABNORMAL LOW (ref 22–32)
Calcium: 9.4 mg/dL (ref 8.9–10.3)
Chloride: 105 mmol/L (ref 98–111)
Creatinine, Ser: 0.91 mg/dL (ref 0.61–1.24)
GFR calc Af Amer: >60 mL/min (ref >60)
GFR calc non Af Amer: >60 mL/min (ref >60)
Glucose, Bld: 113 mg/dL — ABNORMAL HIGH (ref 70–99)
Potassium: 3.1 mmol/L — ABNORMAL LOW (ref 3.5–5.1)
Sodium: 140 mmol/L (ref 135–145)
Total Bilirubin: 1.4 mg/dL — ABNORMAL HIGH (ref 0.3–1.2)
Total Protein: 7.3 g/dL (ref 6.5–8.1)

## 2019-05-10 LAB — CBC
HCT: 43.8 % (ref 39.0–52.0)
Hemoglobin: 15.5 g/dL (ref 13.0–17.0)
MCH: 31.3 pg (ref 26.0–34.0)
MCHC: 35.4 g/dL (ref 30.0–36.0)
MCV: 88.5 fL (ref 80.0–100.0)
Platelets: 205 10*3/uL (ref 150–400)
RBC: 4.95 MIL/uL (ref 4.22–5.81)
RDW: 12 % (ref 11.5–15.5)
WBC: 10.3 10*3/uL (ref 4.0–10.5)
nRBC: 0 % (ref 0.0–0.2)

## 2019-05-10 LAB — LIPASE, BLOOD: Lipase: 43 U/L (ref 11–51)

## 2019-05-10 IMAGING — DX ABDOMEN - 2 VIEW
2 series · 2 of 2 positions shown · non-contrast
Comparison: CT [DATE].

CLINICAL DATA: Abdominal and pain.  Nausea vomiting.

EXAM:
ABDOMEN - 2 VIEW

[abdomen supine (1 of 2)]
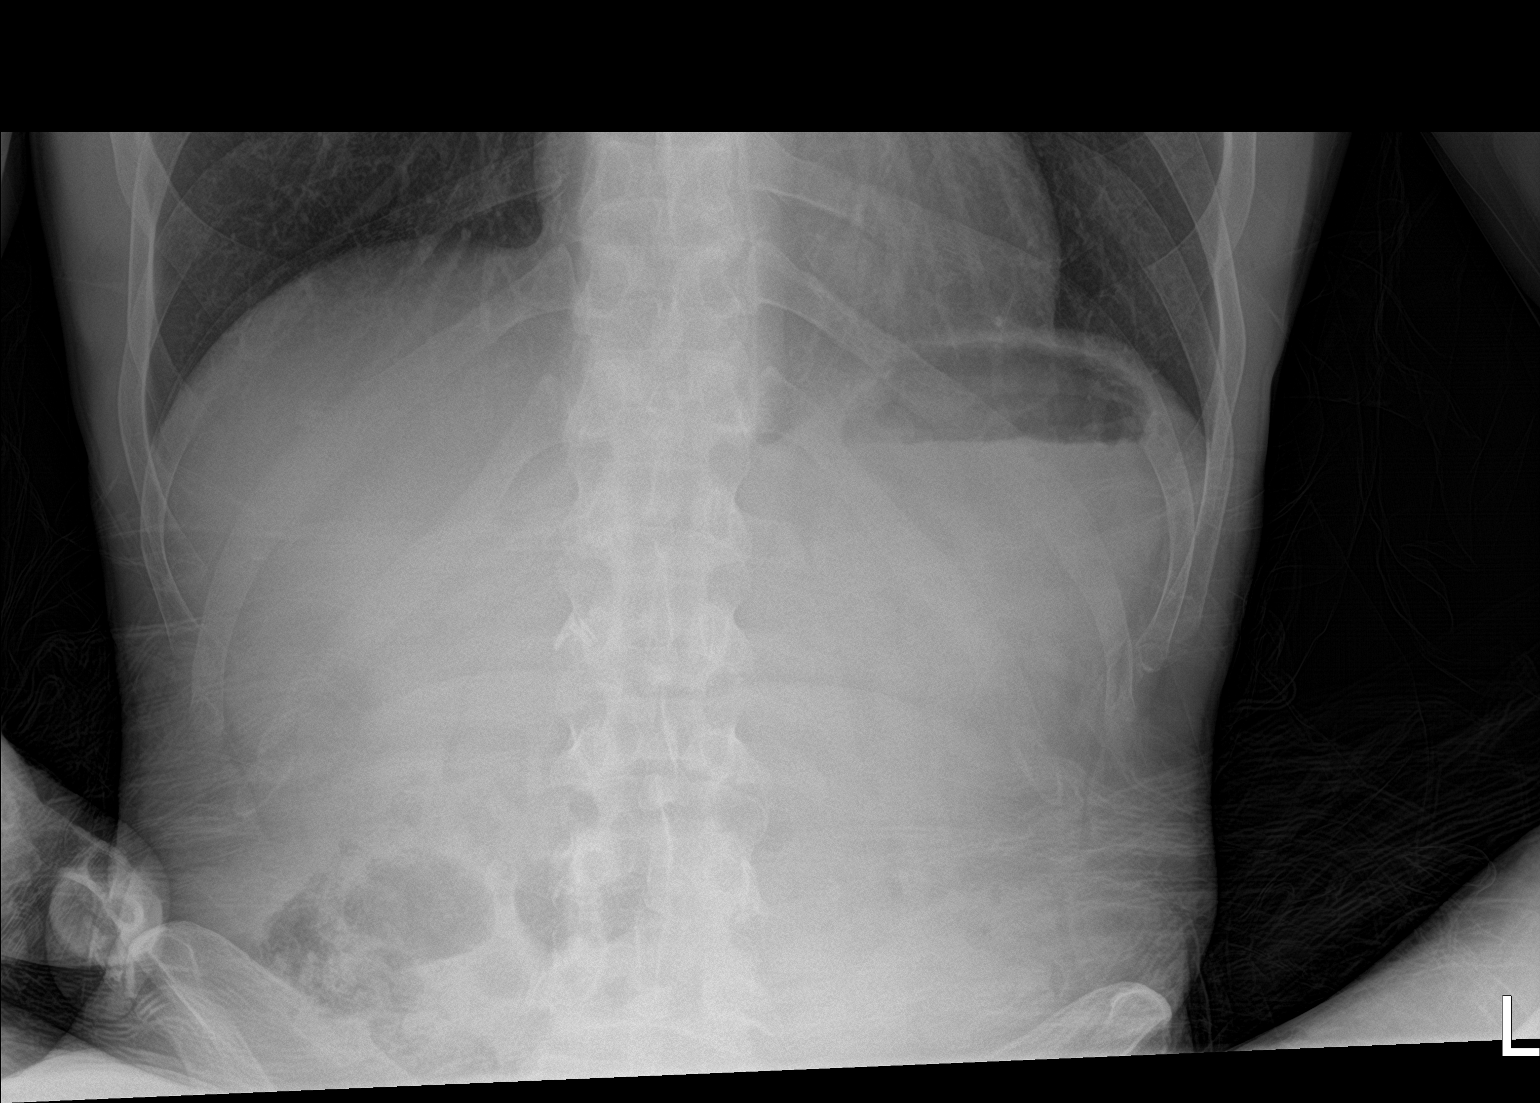

[abdomen supine (2 of 2)]
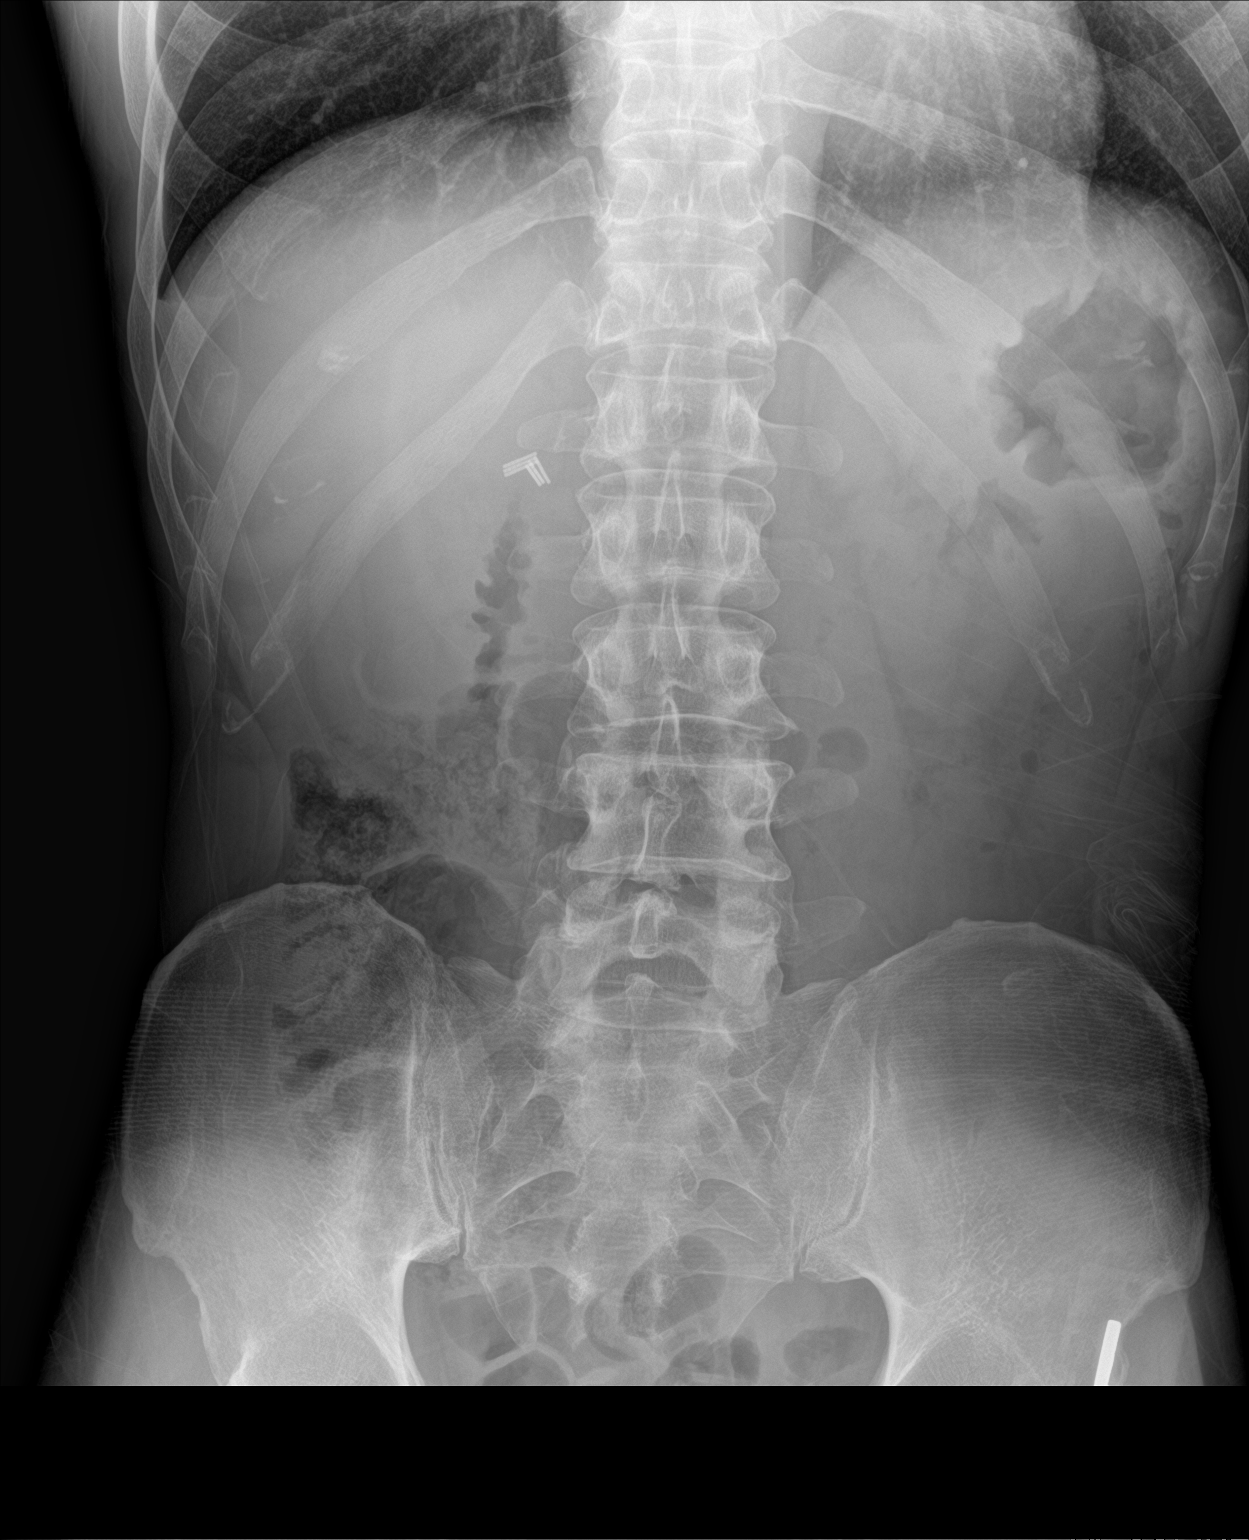

[2 of 2 positions shown; findings below may reference images not displayed]

FINDINGS: Soft tissue structures are unremarkable. Surgical clips right upper
quadrant. No bowel distention. No free air. Pelvic calcifications
consistent with phleboliths. Degenerative change thoracic spine.
IMPRESSION: 1.  Surgical clips right upper quadrant.

2. No acute or focal abnormality identified. No bowel distention or
free air.

## 2019-05-10 MED ORDER — HYDROMORPHONE HCL 1 MG/ML IJ SOLN
1.0000 mg | Freq: Once | INTRAMUSCULAR | Status: AC
  Administered 2019-05-10: 1 mg via INTRAVENOUS
  Filled 2019-05-10: qty 1

## 2019-05-10 MED ORDER — ONDANSETRON HCL 4 MG/2ML IJ SOLN
4.0000 mg | Freq: Once | INTRAMUSCULAR | Status: AC
  Administered 2019-05-10: 4 mg via INTRAVENOUS
  Filled 2019-05-10: qty 2

## 2019-05-10 MED ORDER — METOCLOPRAMIDE HCL 5 MG/ML IJ SOLN
10.0000 mg | Freq: Once | INTRAMUSCULAR | Status: AC
  Administered 2019-05-10: 10 mg via INTRAVENOUS
  Filled 2019-05-10: qty 2

## 2019-05-10 MED ORDER — SODIUM CHLORIDE 0.9% FLUSH: 3.0000 mL | Freq: Once | INTRAVENOUS | Status: DC

## 2019-05-10 MED ORDER — LACTATED RINGERS IV BOLUS
1000.0000 mL | Freq: Once | INTRAVENOUS | Status: AC
  Administered 2019-05-10: 1000 mL via INTRAVENOUS

## 2019-05-10 MED ORDER — POTASSIUM CHLORIDE 20 MEQ/15ML (10%) PO SOLN: 20.0000 meq | Freq: Every day | ORAL | 0 refills | Status: DC

## 2019-05-10 MED ORDER — POTASSIUM CHLORIDE CRYS ER 20 MEQ PO TBCR
40.0000 meq | EXTENDED_RELEASE_TABLET | Freq: Once | ORAL | Status: AC
  Administered 2019-05-10: 10:00:00 40 meq via ORAL
  Filled 2019-05-10: qty 2

## 2019-05-10 MED ORDER — ONDANSETRON 4 MG PO TBDP: 4.0000 mg | ORAL_TABLET | Freq: Three times a day (TID) | ORAL | 0 refills | Status: DC | PRN

## 2019-05-10 MED ORDER — POTASSIUM CHLORIDE 20 MEQ/15ML (10%) PO SOLN
40.0000 meq | Freq: Once | ORAL | Status: AC
  Administered 2019-05-10: 40 meq via ORAL
  Filled 2019-05-10: qty 30

## 2019-05-10 NOTE — ED Triage Notes (Signed)
Pt present with upper abdominal pain with n/v. Denies diarrhea.  Was seen on Tuesday but states not relief.

## 2019-05-10 NOTE — ED Notes (Signed)
Pt unable to swallow potassium pills. Will give oral instead.

## 2019-05-10 NOTE — ED Notes (Signed)
Pt reminded a urine specimen was needed. Urinal left at bedside. Pt will notify staff when one can be obtained.

## 2019-05-10 NOTE — ED Provider Notes (Signed)
[facility] EMERGENCY DEPARTMENT Provider Note   CSN: [id number] Arrival date & time: 05/10/19  0744    History   Chief Complaint Chief Complaint  Patient presents with   Abdominal Pain   Emesis    HPI Troy Leon is a 48 y.o. male.     HPI  48 year old male presents with vomiting and abdominal pain similar to his recurrent pancreatitis.  Started this morning.  He was seen here a few days ago with a negative CT scan.  No hematemesis.  No fevers, chest pain, shortness of breath.  Pain is severe in his mid to upper abdomen.  Feels like many prior pancreatitis episodes.  Past Medical History:  Diagnosis Date   Pancreatitis     Patient Active Problem List   Diagnosis Date Noted   Abdominal pain 07/18/2013   Gastroenteritis, acute 07/18/2013   Dehydration 07/18/2013   Tobacco abuse 07/18/2013   Intractable vomiting 01/30/2013   Abdominal pain, epigastric 01/30/2013   Alcohol intoxication ([place]) 01/30/2013   Leukocytosis, unspecified 01/30/2013   Cigarette smoker 01/30/2013   Lactic acidosis 01/30/2013    Past Surgical History:  Procedure Laterality Date   CHOLECYSTECTOMY          Home Medications    Prior to Admission medications   Medication Sig Start Date End Date Taking? Authorizing Provider  Aspirin-Acetaminophen-Caffeine (GOODY HEADACHE PO) Take 1 Package by mouth daily as needed.   Yes [provider]  metoCLOPramide (REGLAN) 10 MG tablet Take 10 mg by mouth once.   Yes [provider]  promethazine (PHENERGAN) 25 MG tablet Take 1 tablet (25 mg total) by mouth every 6 (six) hours as needed for nausea or vomiting. 05/08/19  Yes Noemi Chapel, MD  ondansetron (ZOFRAN ODT) 4 MG disintegrating tablet Take 1 tablet (4 mg total) by mouth every 8 (eight) hours as needed. 4mg  ODT q4 hours prn nausea/vomit 05/10/19   Sherwood Gambler, MD  potassium chloride 20 MEQ/15ML (10%) SOLN Take 15 mLs (20 mEq total) by mouth daily for 3 days.  05/10/19 05/13/19  Sherwood Gambler, MD    Family History Family History  Problem Relation Age of Onset   Diabetes Mother    Diabetes Other    Heart attack Other    Hypertension Other     Social History Social History   Tobacco Use   Smoking status: Current Every Day Smoker    Packs/day: 0.50    Types: Cigarettes   Smokeless tobacco: Never Used  Substance Use Topics   Alcohol use: No   Drug use: Yes    Types: Marijuana    Comment: occ     Allergies   Penicillins   Review of Systems Review of Systems  Constitutional: Negative for fever.  Respiratory: Negative for shortness of breath.   Cardiovascular: Negative for chest pain.  Gastrointestinal: Positive for abdominal pain, nausea and vomiting.  All other systems reviewed and are negative.    Physical Exam Updated Vital Signs BP (!) 146/86 (BP Location: Left Arm)    Pulse (!) 50    Temp 98.5 F (36.9 C) (Oral)    Resp (!) 22    Ht 5\' 7"  (1.702 m)    Wt 59 kg    SpO2 100%    BMI 20.36 kg/m   Physical Exam Vitals signs and nursing note reviewed.  Constitutional:      Appearance: He is well-developed. He is not diaphoretic.  HENT:     Head: Normocephalic and atraumatic.  Right Ear: External ear normal.     Left Ear: External ear normal.     Nose: Nose normal.  Eyes:     General:        Right eye: No discharge.        Left eye: No discharge.  Neck:     Musculoskeletal: Neck supple.  Cardiovascular:     Rate and Rhythm: Normal rate and regular rhythm.     Heart sounds: Normal heart sounds.  Pulmonary:     Effort: Pulmonary effort is normal.     Breath sounds: Normal breath sounds.  Abdominal:     Palpations: Abdomen is soft.     Tenderness: There is generalized abdominal tenderness (worst in the upper abdomen, though has pain diffusely).  Skin:    General: Skin is warm and dry.  Neurological:     Mental Status: He is alert.  Psychiatric:        Mood and Affect: Mood is not anxious.       ED Treatments / Results  Labs (all labs ordered are listed, but only abnormal results are displayed) Labs Reviewed  COMPREHENSIVE METABOLIC PANEL - Abnormal; Notable for the following components:      Result Value   Potassium 3.1 (*)    CO2 20 (*)    Glucose, Bld 113 (*)    Total Bilirubin 1.4 (*)    All other components within normal limits  LIPASE, BLOOD  CBC  URINALYSIS, ROUTINE W REFLEX MICROSCOPIC    EKG None  Radiology Ct Abdomen Pelvis W Contrast  Result Date: 05/08/2019 CLINICAL DATA:  Progressive abdominal pain with nausea and vomiting. EXAM: CT ABDOMEN AND PELVIS WITH CONTRAST TECHNIQUE: Multidetector CT imaging of the abdomen and pelvis was performed using the standard protocol following bolus administration of intravenous contrast. CONTRAST:  150mL OMNIPAQUE IOHEXOL 300 MG/ML  SOLN COMPARISON:  CT scan dated 12/09/2016 FINDINGS: Lower chest: Normal. Hepatobiliary: No focal liver abnormality is seen. Status post cholecystectomy. No biliary dilatation. Pancreas: Unremarkable. No pancreatic ductal dilatation or surrounding inflammatory changes. Spleen: Normal in size without focal abnormality. Adrenals/Urinary Tract: Adrenal glands are unremarkable. Kidneys are normal, without renal calculi, focal lesion, or hydronephrosis. Bladder is unremarkable. Stomach/Bowel: Stomach is within normal limits. Appendix appears normal. No evidence of bowel wall thickening, distention, or inflammatory changes. Vascular/Lymphatic: No significant vascular findings are present. No enlarged abdominal or pelvic lymph nodes. Reproductive: Prostate is unremarkable. Other: There is a tiny amount of fluid in the pelvis, similar to the appearance on the prior study. Musculoskeletal: No acute or significant osseous findings. IMPRESSION: Tiny amount of nonspecific free fluid in the pelvis. Otherwise, benign appearing abdomen. Electronically Signed   By: Lorriane Shire M.D.   On: 05/08/2019 12:47   Dg  Abd 2 Views  Result Date: 05/10/2019 CLINICAL DATA:  Abdominal and pain.  Nausea vomiting. EXAM: ABDOMEN - 2 VIEW COMPARISON:  CT 05/08/2019. FINDINGS: Soft tissue structures are unremarkable. Surgical clips right upper quadrant. No bowel distention. No free air. Pelvic calcifications consistent with phleboliths. Degenerative change thoracic spine. IMPRESSION: 1.  Surgical clips right upper quadrant. 2. No acute or focal abnormality identified. No bowel distention or free air. Electronically Signed   By: Marcello Moores  Register   On: 05/10/2019 09:29    Procedures Procedures (including critical care time)  Medications Ordered in ED Medications  HYDROmorphone (DILAUDID) injection 1 mg (1 mg Intravenous Given 05/10/19 0835)  metoCLOPramide (REGLAN) injection 10 mg (10 mg Intravenous Given 05/10/19 0835)  lactated  ringers bolus 1,000 mL (0 mLs Intravenous Stopped 05/10/19 0949)  potassium chloride SA (K-DUR) CR tablet 40 mEq (40 mEq Oral Given 05/10/19 0949)  HYDROmorphone (DILAUDID) injection 1 mg (1 mg Intravenous Given 05/10/19 0949)  ondansetron (ZOFRAN) injection 4 mg (4 mg Intravenous Given 05/10/19 0949)  potassium chloride 20 MEQ/15ML (10%) solution 40 mEq (40 mEq Oral Given 05/10/19 1035)     Initial Impression / Assessment and Plan / ED Course  I have reviewed the triage vital signs and the nursing notes.  Pertinent labs & imaging results that were available during my care of the patient were reviewed by me and considered in my medical decision making (see chart for details).        This appears to be a recurrent abdominal pain and vomiting issue for the patient.  He was able to tolerate liquid potassium though could not do oral potassium pills because of their size.  His labs are overall reassuring besides the mild hypokalemia.  At this point I do not think a repeat CT scan is warranted and this is probably more of a chronic issue.  I have instructed him to follow-up with GI and he would like  for his PCP to refer him.  We discussed return precautions but at this time he appears stable for discharge home.  Final Clinical Impressions(s) / ED Diagnoses   Final diagnoses:  Nausea & vomiting  Epigastric abdominal pain  Hypokalemia    ED Discharge Orders         Ordered    ondansetron (ZOFRAN ODT) 4 MG disintegrating tablet  Every 8 hours PRN     05/10/19 1048    potassium chloride 20 MEQ/15ML (10%) SOLN  Daily     05/10/19 1049           Sherwood Gambler, MD 05/10/19 1101

## 2019-05-10 NOTE — Discharge Instructions (Addendum)
If you develop worsening, continued, or recurrent abdominal pain, uncontrolled vomiting, fever, chest or back pain, or any other new/concerning symptoms then return to the ER for evaluation.  

## 2019-05-10 NOTE — ED Notes (Signed)
Pt called out for more pain medication. EDP notified.

## 2020-01-10 ENCOUNTER — Other Ambulatory Visit: Payer: Self-pay

## 2020-01-10 ENCOUNTER — Ambulatory Visit: Payer: Medicaid Other | Attending: Internal Medicine

## 2020-01-10 DIAGNOSIS — Z20822 Contact with and (suspected) exposure to covid-19: Secondary | ICD-10-CM | POA: Diagnosis not present

## 2020-01-11 ENCOUNTER — Telehealth: Payer: Self-pay

## 2020-01-11 NOTE — Telephone Encounter (Signed)
Wife checking on COVID 19 results, not available yet. 

## 2020-01-12 ENCOUNTER — Telehealth: Payer: Self-pay | Admitting: *Deleted

## 2020-01-12 ENCOUNTER — Telehealth: Payer: Self-pay

## 2020-01-12 LAB — NOVEL CORONAVIRUS, NAA: SARS-CoV-2, NAA: NOT DETECTED

## 2020-01-12 NOTE — Telephone Encounter (Signed)
Pt's wife called for result of COVID test obtained 01/10/20; explain test has not resulted, and the turn around time is based on the number of tests to be completed; pt's wife advised results are available first in [place], and her husband will receive a call regarding results; she was told to encourage her husband to answer all calls; she verbalized understanding.

## 2020-01-12 NOTE — Telephone Encounter (Signed)
Pt. Checking on COVID 19 results, not available yet. °

## 2020-01-13 ENCOUNTER — Telehealth: Payer: Self-pay | Admitting: *Deleted

## 2020-01-13 NOTE — Telephone Encounter (Signed)
Wife, Rodman Gronlund called in requesting his COVID-19 test result.   I let her know it was not detected meaning he did not have the virus.  She thanked me for my help.

## 2020-03-28 ENCOUNTER — Ambulatory Visit: Payer: Medicaid Other | Attending: Internal Medicine

## 2020-03-28 DIAGNOSIS — Z23 Encounter for immunization: Secondary | ICD-10-CM

## 2020-03-28 NOTE — Progress Notes (Signed)
   Covid-19 Vaccination Clinic  Name:  Troy Leon    MRN: [id number] DOB: [birth year]  03/28/2020  Mr. Troy Leon was observed post Covid-19 immunization for 15 minutes without incident. He was provided with Vaccine Information Sheet and instruction to access the V-Safe system.   Mr. Troy Leon was instructed to call 911 with any severe reactions post vaccine: Marland Kitchen Difficulty breathing  . Swelling of face and throat  . A fast heartbeat  . A bad rash all over body  . Dizziness and weakness   Immunizations Administered    Name Date Dose VIS Date Route   Moderna COVID-19 Vaccine 03/28/2020  9:37 AM 0.5 mL 10/2019 Intramuscular   Manufacturer: Moderna   Lot: [id number]   [place][id number]

## 2020-04-27 ENCOUNTER — Ambulatory Visit: Payer: Self-pay | Attending: Internal Medicine

## 2020-04-27 DIAGNOSIS — Z23 Encounter for immunization: Secondary | ICD-10-CM

## 2020-04-27 NOTE — Progress Notes (Signed)
   Covid-19 Vaccination Clinic  Name:  Troy Leon    MRN: [id number] DOB: October 29, 1971  04/27/2020  [name] was observed post Covid-19 immunization for 15 minutes without incident. He was provided with Vaccine Information Sheet and instruction to access the V-Safe system.   [name] was instructed to call 911 with any severe reactions post vaccine: Marland Kitchen Difficulty breathing  . Swelling of face and throat  . A fast heartbeat  . A bad rash all over body  . Dizziness and weakness   Immunizations Administered    Name Date Dose VIS Date Route   Moderna COVID-19 Vaccine 04/27/2020  9:41 AM 0.5 mL 10/2019 Intramuscular   Manufacturer: Moderna   Lot: [id number]   [place]: [id number]

## 2020-07-13 ENCOUNTER — Encounter: Payer: Self-pay | Admitting: Family Medicine

## 2020-07-13 ENCOUNTER — Other Ambulatory Visit: Payer: Self-pay

## 2020-07-13 ENCOUNTER — Ambulatory Visit (INDEPENDENT_AMBULATORY_CARE_PROVIDER_SITE_OTHER): Payer: Medicaid Other | Admitting: Family Medicine

## 2020-07-13 VITALS — BP 104/86 | HR 59 | Temp 97.7°F | Ht 67.0 in | Wt 140.0 lb

## 2020-07-13 DIAGNOSIS — R5383 Other fatigue: Secondary | ICD-10-CM | POA: Insufficient documentation

## 2020-07-13 DIAGNOSIS — R1084 Generalized abdominal pain: Secondary | ICD-10-CM

## 2020-07-13 NOTE — Patient Instructions (Signed)
Take Ibuprofen 400 mg every 4 hours with food for left vein inflammation Use ice for 20 minutes every 2 hours for swelling. Stop compressing the under side of left arm if you can. If this isn't helping , we can send you to a vein and vascular specialist.

## 2020-07-13 NOTE — Progress Notes (Signed)
Patient ID: Troy Leon, male    DOB: 10-08-1971, 49 y.o.   MRN: [id number]   Chief Complaint  Patient presents with  . Arm Pain   Subjective:    HPI Pt is here for left arm vein swelling. 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 Pt states this has happened before. Left arm is sore.    Feeling fatigued and not sleeping at all. Sometimes will lay down but tosses and turns.     Medical History Troy Leon has a past medical history of Pancreatitis.   Outpatient Encounter Medications as of 07/13/2020  Medication Sig  . [DISCONTINUED] Aspirin-Acetaminophen-Caffeine (GOODY HEADACHE PO) Take 1 Package by mouth daily as needed.  . [DISCONTINUED] metoCLOPramide (REGLAN) 10 MG tablet Take 10 mg by mouth once.  . [DISCONTINUED] ondansetron (ZOFRAN ODT) 4 MG disintegrating tablet Take 1 tablet (4 mg total) by mouth every 8 (eight) hours as needed. 3m ODT q4 hours prn nausea/vomit  . [DISCONTINUED] potassium chloride 20 MEQ/15ML (10%) SOLN Take 15 mLs (20 mEq total) by mouth daily for 3 days.  . [DISCONTINUED] promethazine (PHENERGAN) 25 MG tablet Take 1 tablet (25 mg total) by mouth every 6 (six) hours as needed for nausea or vomiting.   No facility-administered encounter medications on file as of 07/13/2020.     Review of Systems  Constitutional: Positive for fatigue. Negative for chills and fever.  HENT: Negative.   Eyes: Negative.   Respiratory: Negative.   Cardiovascular: Negative.   Gastrointestinal: Negative.   Endocrine: Negative.   Genitourinary: Negative.   Musculoskeletal: Negative.        Left arm basillic vein enlarges when compressed under the arm  Skin: Negative.   Neurological: Negative.   Hematological: Negative.   Psychiatric/Behavioral: Negative.      Vitals BP 104/86   Pulse (!) 59   Temp 97.7 F (36.5 C)   Ht 5' 7" (1.702 m)   Wt 140 lb (63.5 kg)   SpO2 98%   BMI 21.93 kg/m   Objective:   Physical  Exam Vitals and nursing note reviewed.  Constitutional:      Appearance: Normal appearance. He is normal weight.  Cardiovascular:     Rate and Rhythm: Normal rate and regular rhythm.     Pulses: Normal pulses.     Heart sounds: Normal heart sounds.  Pulmonary:     Effort: Pulmonary effort is normal.     Breath sounds: Normal breath sounds.  Musculoskeletal:     Left upper arm: Swelling and tenderness present.     Comments: Left basillic vein tender to touch and slightly swollen after leaning on underside of left arm earlier today at work. Has happened before and usually resolves, but he wanted to have it looked at today. Pulses and circulation are normal. Skin warm.   Skin:    General: Skin is warm and dry.     Capillary Refill: Capillary refill takes less than 2 seconds.  Neurological:     Mental Status: He is alert and oriented to person, place, and time.  Psychiatric:        Mood and Affect: Mood normal.        Behavior: Behavior normal.      Assessment and Plan   1. Fatigue, unspecified type - CBC With Differential - Comprehensive Metabolic Panel (CMET) - Lipid Profile - Sed Rate (ESR)  2. Generalized abdominal pain - Amylase - Lipase   Troy Leon  presents today to have his left arm basillic vein examined. He was leaning on the undeside of his left arm and it caused the vein to enlarge and now tender to touch. This has happened before.   We will treat conservatively for now: Use Ibuprofen 400 mg 4 times per day with food. Use ice for 20 minutes every 2 hours. Try to avoid compressing the underside of arm if you can. If it is not better in 2 weeks, can consider a vein and vascular specialist for further evaluation.  As visit was ending, Troy Leon reports he has been "dog tired" lately. He has not had routine labs drawn in over a year, he will stop on the way out today for labs. He also reports occasional abdominal pain (none today) so I added amylace and lipase to today's  labs. Reports a history of pancreatitis.   Agrees with plan of care discussed today. Understands warning signs to seek further care: any concerning symptoms that worries him.  Understands to follow-up in 2 weeks, sooner if needed.  We will review lab results at follow-up in 2 weeks. If there is anything concerning we will notify him asap.    Chalmers Guest, NP 07/13/2020

## 2020-07-31 ENCOUNTER — Other Ambulatory Visit: Payer: Self-pay

## 2020-07-31 ENCOUNTER — Ambulatory Visit: Payer: Medicaid Other | Admitting: Family Medicine

## 2020-11-16 ENCOUNTER — Other Ambulatory Visit: Payer: Self-pay | Admitting: *Deleted

## 2020-11-16 ENCOUNTER — Telehealth: Payer: Self-pay | Admitting: Family Medicine

## 2020-11-16 DIAGNOSIS — Z13220 Encounter for screening for lipoid disorders: Secondary | ICD-10-CM

## 2020-11-16 DIAGNOSIS — R5383 Other fatigue: Secondary | ICD-10-CM

## 2020-11-16 NOTE — Telephone Encounter (Signed)
Labs ordered, lm notifying pt.

## 2020-11-16 NOTE — Telephone Encounter (Signed)
Patient needing labs for physical in March

## 2020-11-16 NOTE — Telephone Encounter (Signed)
07/13/20 orders was put in for lipase, amylase, lipid, cmp, cbc and not done. Do you want these labs for phyiscal or add anything

## 2020-11-16 NOTE — Telephone Encounter (Signed)
Lipid, CMP, metabolic 7

## 2020-12-01 ENCOUNTER — Encounter (HOSPITAL_COMMUNITY): Payer: Self-pay | Admitting: Emergency Medicine

## 2020-12-01 ENCOUNTER — Telehealth: Payer: Self-pay | Admitting: Family Medicine

## 2020-12-01 ENCOUNTER — Telehealth (INDEPENDENT_AMBULATORY_CARE_PROVIDER_SITE_OTHER): Payer: Medicaid Other | Admitting: Family Medicine

## 2020-12-01 ENCOUNTER — Emergency Department (HOSPITAL_COMMUNITY)
Admission: EM | Admit: 2020-12-01 | Discharge: 2020-12-01 | Disposition: A | Discharge status: Home / Self Care | Payer: Medicaid Other | Attending: Emergency Medicine | Admitting: Emergency Medicine

## 2020-12-01 ENCOUNTER — Other Ambulatory Visit: Payer: Self-pay

## 2020-12-01 ENCOUNTER — Encounter: Payer: Self-pay | Admitting: Family Medicine

## 2020-12-01 DIAGNOSIS — R42 Dizziness and giddiness: Secondary | ICD-10-CM | POA: Insufficient documentation

## 2020-12-01 DIAGNOSIS — Z5321 Procedure and treatment not carried out due to patient leaving prior to being seen by health care provider: Secondary | ICD-10-CM | POA: Diagnosis not present

## 2020-12-01 DIAGNOSIS — R519 Headache, unspecified: Secondary | ICD-10-CM | POA: Diagnosis not present

## 2020-12-01 DIAGNOSIS — R4781 Slurred speech: Secondary | ICD-10-CM

## 2020-12-01 LAB — CBC
HCT: 43.8 % (ref 39.0–52.0)
Hemoglobin: 14.4 g/dL (ref 13.0–17.0)
MCH: 32.3 pg (ref 26.0–34.0)
MCHC: 32.9 g/dL (ref 30.0–36.0)
MCV: 98.2 fL (ref 80.0–100.0)
Platelets: 186 10*3/uL (ref 150–400)
RBC: 4.46 MIL/uL (ref 4.22–5.81)
RDW: 12.4 % (ref 11.5–15.5)
WBC: 8.5 10*3/uL (ref 4.0–10.5)
nRBC: 0 % (ref 0.0–0.2)

## 2020-12-01 LAB — URINALYSIS, ROUTINE W REFLEX MICROSCOPIC
Bilirubin Urine: NEGATIVE
Glucose, UA: NEGATIVE mg/dL
Hgb urine dipstick: NEGATIVE
Ketones, ur: NEGATIVE mg/dL
Leukocytes,Ua: NEGATIVE
Nitrite: NEGATIVE
Protein, ur: NEGATIVE mg/dL
Specific Gravity, Urine: 1.024 (ref 1.005–1.030)
pH: 5 (ref 5.0–8.0)

## 2020-12-01 LAB — BASIC METABOLIC PANEL
Anion gap: 9 (ref 5–15)
BUN: 15 mg/dL (ref 6–20)
CO2: 29 mmol/L (ref 22–32)
Calcium: 9.3 mg/dL (ref 8.9–10.3)
Chloride: 103 mmol/L (ref 98–111)
Creatinine, Ser: 0.99 mg/dL (ref 0.61–1.24)
GFR, Estimated: >60 mL/min (ref >60)
Glucose, Bld: 92 mg/dL (ref 70–99)
Potassium: 4 mmol/L (ref 3.5–5.1)
Sodium: 141 mmol/L (ref 135–145)

## 2020-12-01 NOTE — Progress Notes (Signed)
Pt having chills/sweats, dizziness, "brutal" headaches, fatigue. Pt states he feels like his blood pressure is going up and down. Headaches began about one month ago and dizziness has been going for a while also. Headache starts at back of neck/head. Pt also states his headache will cause his ears to feel full (like when driving in the mountains). Pt has taking Ibuprofen.  Virtual Visit via Video Note  I connected with Troy Leon on 12/01/20 at 10:30 AM EST by a video enabled telemedicine application and verified that I am speaking with the correct person using two identifiers.  Location: Patient: home Provider: office   I discussed the limitations of evaluation and management by telemedicine and the availability of in person appointments. The patient expressed understanding and agreed to proceed.  History of Present Illness:    Observations/Objective:   Assessment and Plan:   Follow Up Instructions:    I discussed the assessment and treatment plan with the patient. The patient was provided an opportunity to ask questions and all were answered. The patient agreed with the plan and demonstrated an understanding of the instructions.   The patient was advised to call back or seek an in-person evaluation if the symptoms worsen or if the condition fails to improve as anticipated.  I provided 14 minutes of non-face-to-face time during this encounter.    Patient ID: Troy Leon, male    DOB: Nov 22, 1970, 50 y.o.   MRN: [id number]   Chief Complaint  Patient presents with  . Headache   Subjective:  CC: Chills, sweats, dizziness, pressure in the ears and behind the head, headache  This is a new problem.  Presents today via telephone visit with a complaint of chills, sweats, dizziness, pressure in the ears and behind the head, headache.  Describes headache as "brutal ".  Headache has been present for 1 month.  Describes as pressure builds in the back of the head and neck, when  this happens, he feels dizzy, needs someone to hold him up, has slurred speech, weakness.  Reports that his mom is concerned that he is possibly having a stroke.    Medical History Troy Leon has a past medical history of Pancreatitis.   No outpatient encounter medications on file as of 12/01/2020.   No facility-administered encounter medications on file as of 12/01/2020.     Review of Systems  Constitutional: Positive for chills and fatigue. Negative for fever.  HENT: Negative for congestion and ear pain.   Respiratory: Negative for shortness of breath.   Cardiovascular: Negative for chest pain.  Neurological: Positive for dizziness, weakness and headaches.     Vitals There were no vitals taken for this visit. Concerned that his blood pressure is going up and down, unable to take due to phone visit. Objective:   Physical Exam  Unable to perform physical exam, able to converse throughout telephone visit without obvious shortness of breath. Assessment and Plan   1. Increased severity of headaches  2. Dizziness  3. Slurred speech   Due to being a phone visit, and several "red flag symptoms" such as brutal headache, slurred speech with headache, weakness with headache, dizziness with headache, and needing someone to hold him up, my recommendation is for Troy Leon to seek care at the emergency department.  It is possible that he is having a TIA vs. stroke.  Agrees with plan of care discussed today. Understands warning signs to seek further care: chest pain, shortness of breath, any significant change in health.  Understands to follow-up at emergency department as soon as possible.  Reports that he will notify his wife, and head to the emergency department today.  Nurse phoned triage to alert of his arrival.  Troy Ades, NP 12/01/2020

## 2020-12-01 NOTE — Telephone Encounter (Signed)
Mr. Troy Leon, selvidge are scheduled for a virtual visit with your provider today.    Just as we do with appointments in the office, we must obtain your consent to participate.  Your consent will be active for this visit and any virtual visit you may have with one of our providers in the next 365 days.    If you have a MyChart account, I can also send a copy of this consent to you electronically.  All virtual visits are billed to your insurance company just like a traditional visit in the office.  As this is a virtual visit, video technology does not allow for your provider to perform a traditional examination.  This may limit your provider's ability to fully assess your condition.  If your provider identifies any concerns that need to be evaluated in person or the need to arrange testing such as labs, EKG, etc, we will make arrangements to do so.    Although advances in technology are sophisticated, we cannot ensure that it will always work on either your end or our end.  If the connection with a video visit is poor, we may have to switch to a telephone visit.  With either a video or telephone visit, we are not always able to ensure that we have a secure connection.   I need to obtain your verbal consent now.   Are you willing to proceed with your visit today?   NAEL PETROSYAN has provided verbal consent on 12/01/2020 for a virtual visit (video or telephone).   Vicente Males, LPN 9/77/4142  39:53 AM

## 2020-12-01 NOTE — ED Triage Notes (Signed)
Pt states he has had HA for the last month with dizziness.  Pt states at times he breaks out into cold sweats.  Denies N/V/D/F.

## 2020-12-08 ENCOUNTER — Encounter: Payer: Self-pay | Admitting: Family Medicine

## 2020-12-08 ENCOUNTER — Other Ambulatory Visit: Payer: Self-pay

## 2020-12-08 ENCOUNTER — Ambulatory Visit (INDEPENDENT_AMBULATORY_CARE_PROVIDER_SITE_OTHER): Payer: Medicaid Other | Admitting: Family Medicine

## 2020-12-08 VITALS — BP 120/78 | Ht 67.0 in | Wt 140.0 lb

## 2020-12-08 DIAGNOSIS — R42 Dizziness and giddiness: Secondary | ICD-10-CM

## 2020-12-08 DIAGNOSIS — R519 Headache, unspecified: Secondary | ICD-10-CM

## 2020-12-08 DIAGNOSIS — F419 Anxiety disorder, unspecified: Secondary | ICD-10-CM | POA: Diagnosis not present

## 2020-12-08 DIAGNOSIS — R531 Weakness: Secondary | ICD-10-CM

## 2020-12-08 MED ORDER — BUSPIRONE HCL 10 MG PO TABS: ORAL_TABLET | ORAL | 1 refills | Status: DC

## 2020-12-08 MED ORDER — INDOMETHACIN 25 MG PO CAPS
ORAL_CAPSULE | ORAL | 0 refills | Status: DC
Start: 2020-12-08 — End: 2021-01-09

## 2020-12-08 NOTE — Progress Notes (Signed)
Subjective:    Patient ID: Troy Leon, male    DOB: [birth year], 50 y.o.   MRN: [id number]  HPI  Patient arrives for a follow up from a recent ER visit for headache and dizziness. Patient states it is hard to walk when he is having an episode the patient relates that he finds himself feeling weak at times feels washed out feels like he wants to collapse these waves hit him intermittently along with dizziness and nausea he states he is trying to eat properly  He does state he is under a lot of stress but denies being depressed denies wheezing or difficulty breathing states his energy level is halfway decent he does find himself getting down about the spells but denies being depressed  Denies any chest pressure tightness pain shortness of breath or passing out  He relates frequent headaches that have been going on over the past 6 weeks progressive now to the point of waking him up at night with nausea no vomiting no known no double vision the headache starts in the back of his head around the neck and radiates up across his head to the temple regions.  He has never had this problem before.  Review of Systems  Constitutional: Negative for diaphoresis and fatigue.  HENT: Negative for congestion and rhinorrhea.   Respiratory: Negative for cough and shortness of breath.   Cardiovascular: Negative for chest pain and leg swelling.  Gastrointestinal: Negative for abdominal pain and diarrhea.  Skin: Negative for color change and rash.  Neurological: Positive for dizziness, weakness, light-headedness and headaches.  Psychiatric/Behavioral: Negative for behavioral problems and confusion. The patient is nervous/anxious.        Objective:   Physical Exam Vitals reviewed.  Constitutional:      General: He is not in acute distress.    Appearance: He is well-nourished.  HENT:     Head: Normocephalic and atraumatic.  Eyes:     General:        Right eye: No discharge.        Left eye: No  discharge.  Neck:     Trachea: No tracheal deviation.  Cardiovascular:     Rate and Rhythm: Normal rate and regular rhythm.     Heart sounds: Normal heart sounds. No murmur heard.   Pulmonary:     Effort: Pulmonary effort is normal. No respiratory distress.     Breath sounds: Normal breath sounds.  Musculoskeletal:        General: No edema.  Lymphadenopathy:     Cervical: No cervical adenopathy.  Skin:    General: Skin is warm and dry.  Neurological:     Mental Status: He is alert.     Coordination: Coordination normal.  Psychiatric:        Mood and Affect: Mood and affect normal.        Behavior: Behavior normal.    Finger-to-nose is normal Romberg is negative EOMI no focal findings       Assessment & Plan:  1. Frequent headaches We will check sed rate to make sure he does not have temporal arteritis which I think is low likelihood  I am concerned about these being nocturnal as well as frequent and severe this is not something that has been going on for this patient long-term this is new and therefore indicates the possibility of underlying tumor or growth very important for the patient to get an MRI I did tell the patient if he gets acutely  worse to go to the ER otherwise to do the MRI as directed  We will allow for him to use Indocin for the headaches but not to take ibuprofen with that  We will consider topiramate but await the results of the MRI first - Sed Rate (ESR) - C-reactive protein - MR Brain Wo Contrast  2. Dizzy His dizzy spells could be related to the severe headaches but could also be a sign of underlying neurologic issue or even potentially anxiety hold off on any type of Antivert not true vertigo - Sed Rate (ESR) - C-reactive protein - MR Brain Wo Contrast  3. Weakness The weakness I believe is associated with the severe headaches as well as the nausea that comes with it I do not find any evidence of any type of underlying musculoskeletal  weakness - Sed Rate (ESR) - C-reactive protein - MR Brain Wo Contrast  4. Anxious mood Very nice patient he is stressed about this and stressed about other staff he inquired about medication for anxiety I do not recommend benzodiazepines because of the risk associated with these but I do think it would be beneficial for him to try BuSpar twice daily as needed to see if that would help consider SSRI but do MRI work-up first patient to follow-up in approximately 3 weeks  5. Nocturnal headaches Please see above for the discussion Needs MRI to rule out the possibility of tumor because of nocturnal headaches with new onset and severe nature to go to the ER if worse - MR Brain Wo Contrast

## 2020-12-18 ENCOUNTER — Telehealth: Payer: Self-pay

## 2020-12-18 NOTE — Telephone Encounter (Signed)
Pt was put on busPIRone (BUSPAR) 10 MG tablet said making him very sleepy and unable to work and forgetful.   Pt call back [phone] Aniceto Boss) wife

## 2020-12-18 NOTE — Telephone Encounter (Signed)
Please advise. Thank you

## 2020-12-18 NOTE — Telephone Encounter (Signed)
So that medication is considered to be relatively mild.  I would recommend stopping it.  Also on previous visit MRI was ordered for frequent headaches.  If patient is still having frequent headaches please move forward with that MRI.  Patient does have follow-up later in February.

## 2020-12-18 NOTE — Telephone Encounter (Signed)
Left message to return call 

## 2020-12-18 NOTE — Telephone Encounter (Addendum)
Patient advised he may stop the medication and MRI is under review for appeal via referral coordinator insurance stated it can take 10-14 days for review  Patient states he was under the impression an pain medication was being sent in for him but the phar,acy states they did not receive it.

## 2020-12-18 NOTE — Telephone Encounter (Signed)
Indocin was sent in for the pain.  We will not be doing pain medications such as narcotics-clinical standard state that these are not beneficial for headaches.  Headache diary recommended please

## 2020-12-21 NOTE — Telephone Encounter (Signed)
Patient advised per Dr Nicki Reaper: Indocin was sent in for the pain.  We will not be doing pain medications such as narcotics-clinical standard state that these are not beneficial for headaches.  Headache diary recommended please Patient notified and verbalized understanding

## 2020-12-29 ENCOUNTER — Other Ambulatory Visit: Payer: Self-pay

## 2020-12-29 ENCOUNTER — Ambulatory Visit: Payer: Medicaid Other | Admitting: Family Medicine

## 2020-12-29 VITALS — BP 122/78 | Temp 97.4°F | Ht 67.0 in | Wt 140.0 lb

## 2020-12-29 DIAGNOSIS — R5383 Other fatigue: Secondary | ICD-10-CM

## 2020-12-29 DIAGNOSIS — R42 Dizziness and giddiness: Secondary | ICD-10-CM | POA: Diagnosis not present

## 2020-12-29 DIAGNOSIS — Z13220 Encounter for screening for lipoid disorders: Secondary | ICD-10-CM | POA: Diagnosis not present

## 2020-12-29 NOTE — Progress Notes (Signed)
   Subjective:    Patient ID: Troy Leon, male    DOB: 1971-08-07, 50 y.o.   MRN: [id number]  HPI  Patient arrives for a follow up on headaches. MRI scheduled 01/04/21. Patient states he is doing much better and not having problems with the headaches like he was- sleeping better.  Some dizziness and balance issues occas headache Eating good No numbness Review of Systems  Constitutional: Negative for activity change.  HENT: Negative for congestion and rhinorrhea.   Respiratory: Negative for cough and shortness of breath.   Cardiovascular: Negative for chest pain.  Gastrointestinal: Negative for abdominal pain, diarrhea, nausea and vomiting.  Genitourinary: Negative for dysuria and hematuria.  Neurological: Positive for dizziness and headaches. Negative for weakness.  Psychiatric/Behavioral: Negative for behavioral problems and confusion.       Objective:   Physical Exam  Finger-to-nose is normal Romberg negative lungs clear heart regular      Assessment & Plan:  Headaches still occur dizziness still occurs but not as bad has MRI coming up. Patient relates a lot of fatigue tiredness we discussed this he states he has very little motivation.  I have encouraged the patient to do some lab work and follow-up for wellness.  Hard to know if he has some underlying stress related issues  Patient inquires about his testosterone level I doubt that this is playing a role but we will check it

## 2021-01-01 DIAGNOSIS — Z13220 Encounter for screening for lipoid disorders: Secondary | ICD-10-CM | POA: Diagnosis not present

## 2021-01-01 DIAGNOSIS — R5383 Other fatigue: Secondary | ICD-10-CM | POA: Diagnosis not present

## 2021-01-01 DIAGNOSIS — R42 Dizziness and giddiness: Secondary | ICD-10-CM | POA: Diagnosis not present

## 2021-01-02 LAB — LIPID PANEL
Chol/HDL Ratio: 2.7 ratio (ref 0.0–5.0)
Cholesterol, Total: 152 mg/dL (ref 100–199)
HDL: 56 mg/dL (ref >39)
LDL Chol Calc (NIH): 80 mg/dL (ref 0–99)
Triglycerides: 85 mg/dL (ref 0–149)
VLDL Cholesterol Cal: 16 mg/dL (ref 5–40)

## 2021-01-02 LAB — BASIC METABOLIC PANEL
BUN/Creatinine Ratio: 12 (ref 9–20)
BUN: 12 mg/dL (ref 6–24)
CO2: 23 mmol/L (ref 20–29)
Calcium: 9.9 mg/dL (ref 8.7–10.2)
Chloride: 101 mmol/L (ref 96–106)
Creatinine, Ser: 0.97 mg/dL (ref 0.76–1.27)
GFR calc Af Amer: 107 mL/min/{1.73_m2} (ref >59)
GFR calc non Af Amer: 93 mL/min/{1.73_m2} (ref >59)
Glucose: 149 mg/dL — ABNORMAL HIGH (ref 65–99)
Potassium: 5.1 mmol/L (ref 3.5–5.2)
Sodium: 141 mmol/L (ref 134–144)

## 2021-01-02 LAB — TESTOSTERONE: Testosterone: 496 ng/dL (ref 264–916)

## 2021-01-02 LAB — TSH: TSH: 1.22 u[IU]/mL (ref 0.450–4.500)

## 2021-01-04 ENCOUNTER — Ambulatory Visit (HOSPITAL_COMMUNITY)
Admission: RE | Admit: 2021-01-04 | Discharge: 2021-01-04 | Disposition: A | Discharge status: Home / Self Care | Payer: Medicaid Other | Source: Ambulatory Visit | Attending: Family Medicine | Admitting: Family Medicine

## 2021-01-04 ENCOUNTER — Other Ambulatory Visit: Payer: Self-pay

## 2021-01-04 ENCOUNTER — Other Ambulatory Visit: Payer: Self-pay | Admitting: Family Medicine

## 2021-01-04 DIAGNOSIS — R42 Dizziness and giddiness: Secondary | ICD-10-CM

## 2021-01-04 DIAGNOSIS — R531 Weakness: Secondary | ICD-10-CM | POA: Insufficient documentation

## 2021-01-04 DIAGNOSIS — R519 Headache, unspecified: Secondary | ICD-10-CM

## 2021-01-04 DIAGNOSIS — G936 Cerebral edema: Secondary | ICD-10-CM | POA: Diagnosis not present

## 2021-01-04 IMAGING — MR MR HEAD WO/W CM
20 of 23 series · 26 of 48 positions shown · IV contrast (gadavist)
Comparison: None.

CLINICAL DATA: Frequent headaches. Dizziness and weakness for 3
weeks.

EXAM:
MRI HEAD WITHOUT AND WITH CONTRAST
MRA HEAD WITHOUT CONTRAST
TECHNIQUE: Multiplanar, multiecho pulse sequences of the brain and surrounding
structures were obtained without and with intravenous contrast.
Angiographic images of the head were obtained using MRA technique
without contrast.
CONTRAST:  7mL GADAVIST GADOBUTROL 1 MMOL/ML IV SOLN

[Series 5: DWI · axial · 3.0mm · 0.77mm/px · 1 of 48 slices shown (1 of 6)]
[im 1/48]
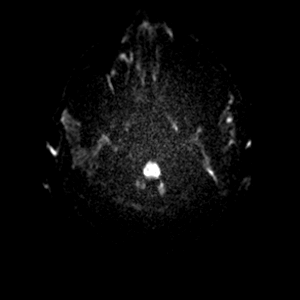

[Series 5: DWI · axial · 3.0mm · 0.77mm/px · 1 of 48 slices shown (2 of 6)]
[im 1/48]
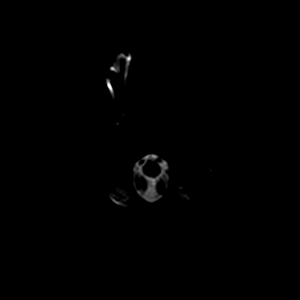

[Series 6: DWI · axial · 3.0mm · 0.77mm/px · 1 of 48 slices shown (3 of 6)]
[im 1/48]
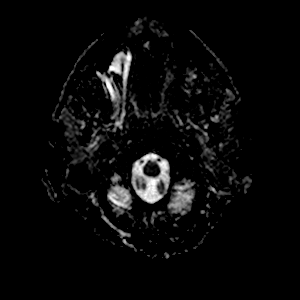

[Series 7: DWI · coronal · 5.0mm · 0.88mm/px · 1 of 28 slices shown (4 of 6)]
[im 1/28]
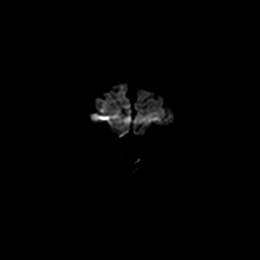

[Series 7: DWI · coronal · 5.0mm · 0.88mm/px · 1 of 28 slices shown (5 of 6)]
[im 1/28]
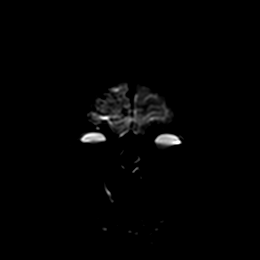

[Series 8: DWI · coronal · 5.0mm · 0.88mm/px · 1 of 28 slices shown (6 of 6)]
[im 1/28]
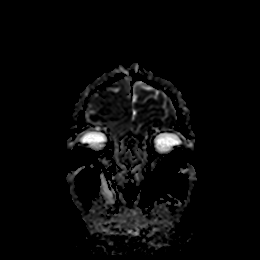

[Series 9: T1 · sagittal · 5.0mm · 0.75mm/px · 1 of 19 slices shown]
[im 1/19]
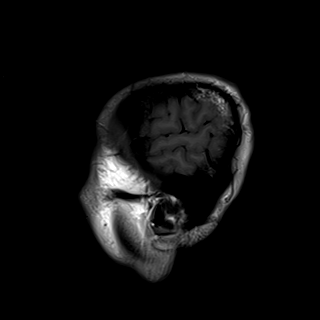

[Series 10: T2 · axial · 5.0mm · 0.72mm/px · 1 of 20 slices shown (1 of 2)]
[im 1/20]
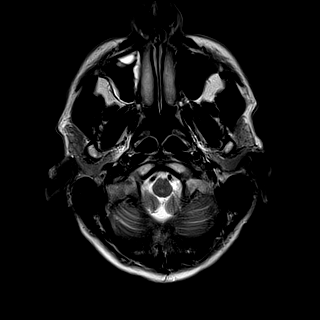

[Series 11: mag_images · axial · 3.0mm · 0.90mm/px · z∈[-89,+87]mm · 3 of 60 slices shown]
[im 1/60]
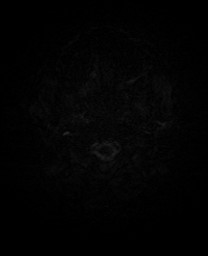
[im 30/60]
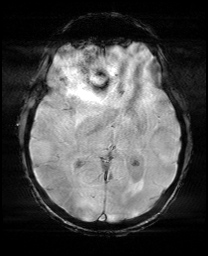
[im 60/60]
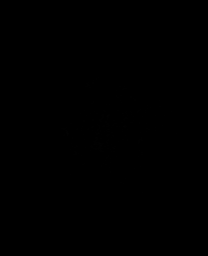

[Series 12: pha_images · axial · 3.0mm · 0.90mm/px · z∈[-89,+69]mm · 2 of 54 slices shown]
[im 1/54]
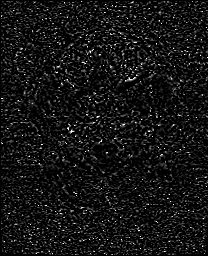
[im 54/54]
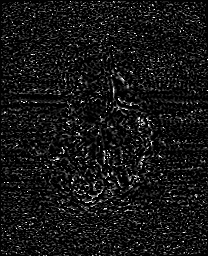

[Series 13: swi_images · axial · 3.0mm · 0.90mm/px · z∈[-89,+87]mm · 3 of 60 slices shown]
[im 1/60]
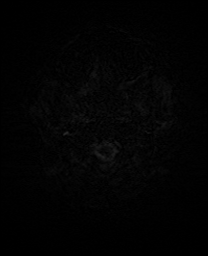
[im 30/60]
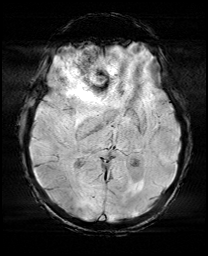
[im 60/60]
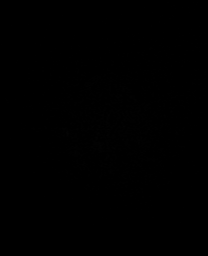

[Series 15: FLAIR · axial · 3.0mm · 0.45mm/px · z∈[-64,+61]mm · 2 of 43 slices shown]
[im 1/43]
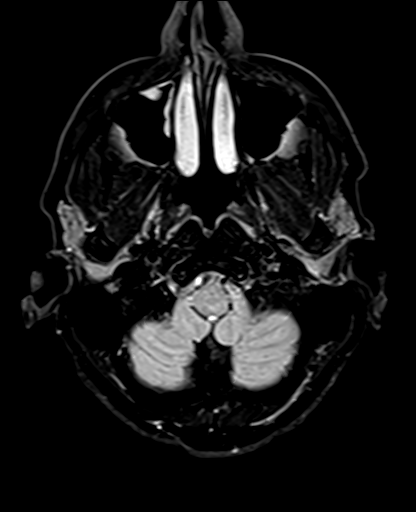
[im 43/43]
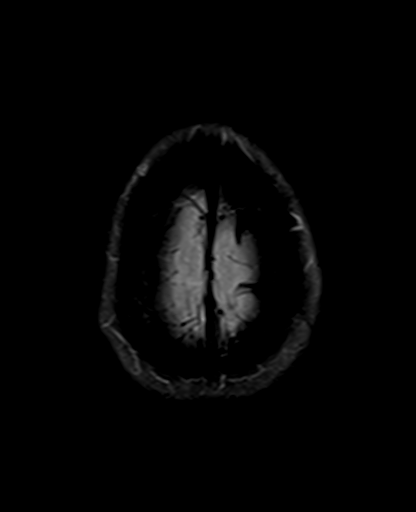

[Series 22: T2 · coronal · 5.0mm · 0.72mm/px · 1 of 28 slices shown (2 of 2)]
[im 1/28]
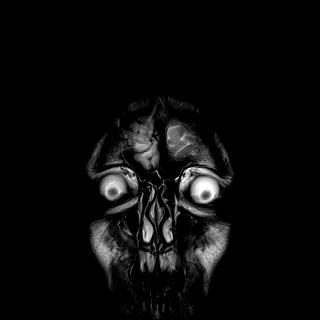

[Series 24: T1 post-contrast · coronal · 5.0mm · 0.34mm/px · 1 of 28 slices shown (1 of 2)]
[im 1/28]
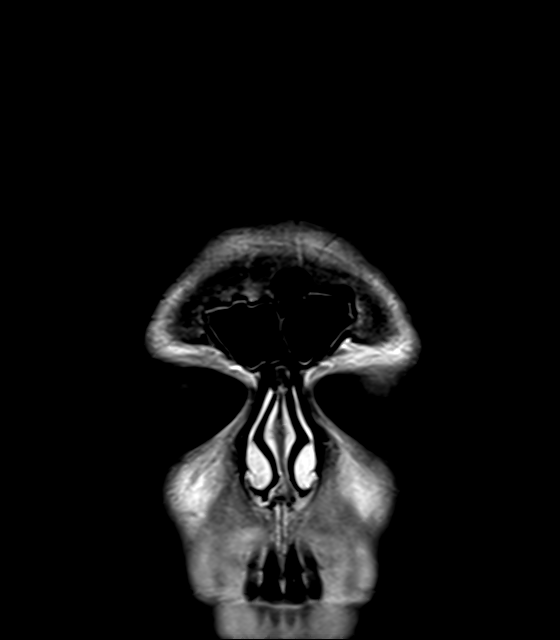

[Series 25: T1 post-contrast · sagittal · 5.0mm · 0.72mm/px · 1 of 19 slices shown (2 of 2)]
[im 1/19]
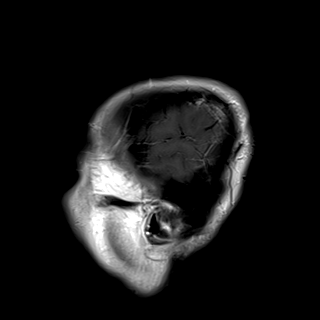

[Series 1025: tumble · 0.46mm/px · 1 of 6 slices shown (1 of 2)]
[im 1/6]
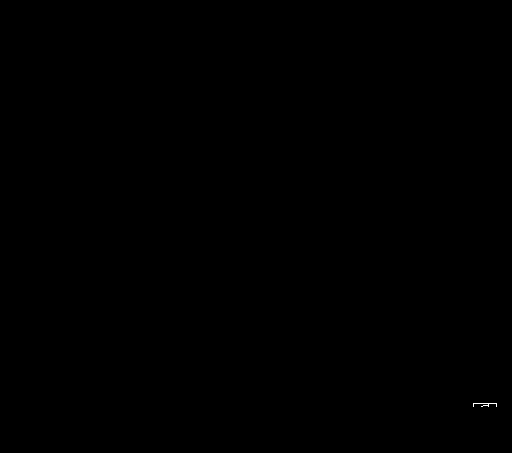

[Series 1029: rotate · 0.38mm/px · 1 of 4 slices shown (1 of 3)]
[im 1/4]
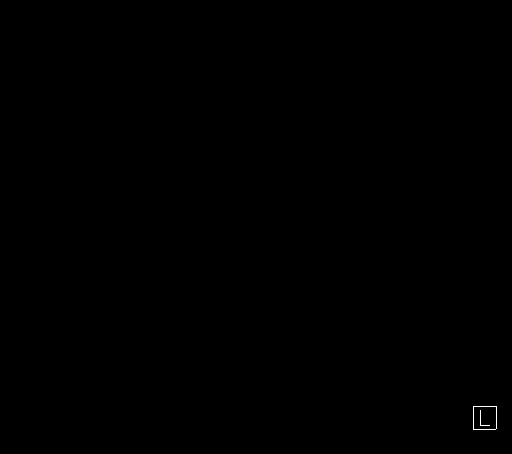

[Series 1034: tumble · 0.44mm/px · 1 of 18 slices shown (2 of 2)]
[im 1/18]
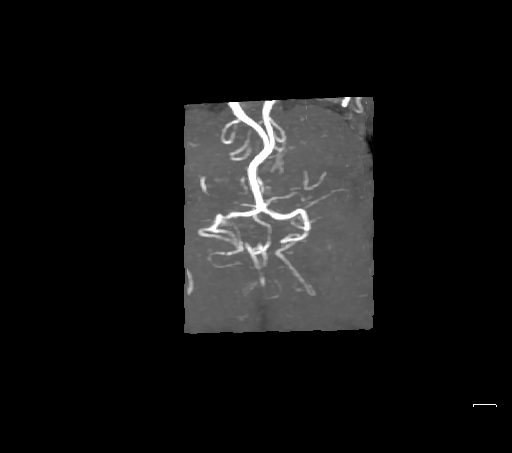

[Series 1038: rotate · 0.34mm/px · 1 of 16 slices shown (2 of 3)]
[im 1/16]
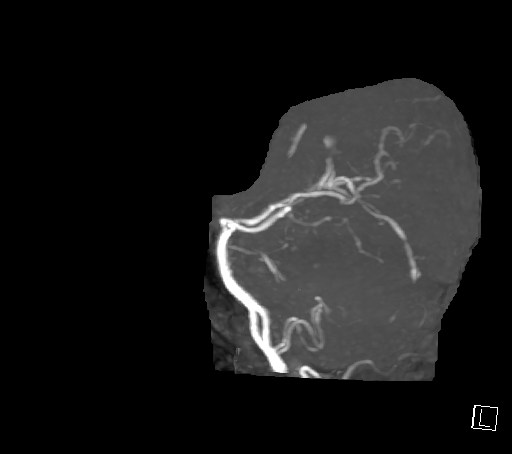

[Series 1046: rotate · 0.38mm/px · 1 of 7 slices shown (3 of 3)]
[im 1/7]
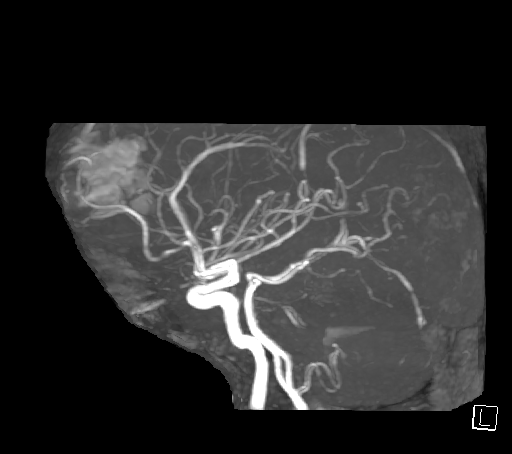

[26 of 48 positions shown; findings below may reference images not displayed]

FINDINGS: MRI HEAD FINDINGS

Brain: There is a large, heterogeneous mass in the anterior right
frontal lobe. The mass involves cortex and white matter with
heterogeneously enhancing components measuring approximately 6.3 x
5.1 cm. Internal hemorrhage is noted in addition to cystic changes
including along the posterior aspect of the mass. There is moderate
T2 hyperintensity in the surrounding white matter which may reflect
edema and potentially nonenhancing tumor, and this extends into the
anterior aspect of the corpus callosum crossing the midline. There
is extensive regional mass effect including on the lateral
ventricles with mild dilatation of the occipital and temporal horns
of the lateral ventricle suggestive of early trapping. Leftward
midline shift measures 1.8 cm. There is mild dural enhancement
overlying the mass.

No second enhancing brain lesion is identified, and the brain is
normal in signal elsewhere. No acute infarct or extra-axial fluid
collection is evident.

Vascular: Major intracranial vascular flow voids are preserved.
Leftward displacement of the anterior cerebral arteries by the mass.

Skull and upper cervical spine: Unremarkable bone marrow signal.

Sinuses/Orbits: Unremarkable orbits. Scattered mild mucosal
thickening in the paranasal sinuses. Clear mastoid air cells.

Other: None.

MRA HEAD FINDINGS

The visualized distal vertebral arteries are widely patent to the
basilar and codominant. Patent PICA and SCA origins are seen
bilaterally. The basilar artery is widely patent. Posterior
communicating arteries are not clearly identified and may be
diminutive or absent. The PCAs are patent without evidence of a
significant proximal stenosis.

The internal carotid arteries are widely patent from skull base to
carotid termini. ACAs and MCAs are patent without evidence of a
proximal branch occlusion or significant proximal stenosis. The ACAs
are displaced leftward by the right frontal mass, and there is a
large right ACA branch that courses into the mass. No aneurysm is
identified.
IMPRESSION: 1. Large, hemorrhagic right frontal mass most concerning for a
high-grade primary neoplasm such as glioblastoma. Extensive regional
edema and mass effect with edema and/or nonenhancing tumor extending
into the corpus callosum.
2. 1.8 cm of leftward midline shift.
3. No major intracranial arterial occlusion or significant proximal
stenosis. Displacement of the anterior cerebral arteries by the mass
with a large right ACA branch coursing into/supplying the mass.

These results will be called to the ordering clinician or
representative by the [HOSPITAL] at the imaging location.

## 2021-01-04 MED ORDER — GADOBUTROL 1 MMOL/ML IV SOLN
7.0000 mL | Freq: Once | INTRAVENOUS | Status: AC | PRN
  Administered 2021-01-04: 7 mL via INTRAVENOUS

## 2021-01-04 MED ORDER — DEXAMETHASONE 6 MG PO TABS: ORAL_TABLET | ORAL | 2 refills | Status: DC

## 2021-01-04 NOTE — Progress Notes (Unsigned)
d 

## 2021-01-05 ENCOUNTER — Other Ambulatory Visit: Payer: Self-pay | Admitting: Neurosurgery

## 2021-01-05 ENCOUNTER — Other Ambulatory Visit (HOSPITAL_COMMUNITY): Payer: Self-pay | Admitting: Neurosurgery

## 2021-01-05 ENCOUNTER — Other Ambulatory Visit (HOSPITAL_COMMUNITY): Payer: Medicaid Other

## 2021-01-05 DIAGNOSIS — C711 Malignant neoplasm of frontal lobe: Secondary | ICD-10-CM

## 2021-01-05 DIAGNOSIS — G939 Disorder of brain, unspecified: Secondary | ICD-10-CM | POA: Diagnosis not present

## 2021-01-06 ENCOUNTER — Other Ambulatory Visit (HOSPITAL_COMMUNITY)
Admission: RE | Admit: 2021-01-06 | Discharge: 2021-01-06 | Disposition: A | Discharge status: Home / Self Care | Payer: Medicaid Other | Source: Ambulatory Visit | Attending: Neurological Surgery | Admitting: Neurological Surgery

## 2021-01-06 DIAGNOSIS — Z01812 Encounter for preprocedural laboratory examination: Secondary | ICD-10-CM | POA: Diagnosis not present

## 2021-01-06 DIAGNOSIS — Z20822 Contact with and (suspected) exposure to covid-19: Secondary | ICD-10-CM | POA: Diagnosis not present

## 2021-01-06 LAB — SARS CORONAVIRUS 2 (TAT 6-24 HRS): SARS Coronavirus 2: NEGATIVE

## 2021-01-07 ENCOUNTER — Ambulatory Visit (HOSPITAL_COMMUNITY)
Admission: RE | Admit: 2021-01-07 | Discharge: 2021-01-07 | Disposition: A | Discharge status: Home / Self Care | Payer: Medicaid Other | Source: Ambulatory Visit | Attending: Neurosurgery | Admitting: Neurosurgery

## 2021-01-07 ENCOUNTER — Other Ambulatory Visit: Payer: Self-pay

## 2021-01-07 DIAGNOSIS — C711 Malignant neoplasm of frontal lobe: Secondary | ICD-10-CM | POA: Diagnosis not present

## 2021-01-07 DIAGNOSIS — R220 Localized swelling, mass and lump, head: Secondary | ICD-10-CM | POA: Diagnosis not present

## 2021-01-07 DIAGNOSIS — I619 Nontraumatic intracerebral hemorrhage, unspecified: Secondary | ICD-10-CM | POA: Diagnosis not present

## 2021-01-07 DIAGNOSIS — G9389 Other specified disorders of brain: Secondary | ICD-10-CM | POA: Diagnosis not present

## 2021-01-07 DIAGNOSIS — G930 Cerebral cysts: Secondary | ICD-10-CM | POA: Diagnosis not present

## 2021-01-07 IMAGING — MR MR HEAD WO/W CM
16 of 17 series · 38 of 48 positions shown · IV contrast (cc GAD)
Comparison: [DATE] and prior.

CLINICAL DATA: Right frontal mass.

EXAM:
MRI HEAD WITHOUT AND WITH CONTRAST
TECHNIQUE: Multiplanar, multiecho pulse sequences of the brain and surrounding
structures were obtained without and with intravenous contrast.
CONTRAST:  6.5mL GADAVIST GADOBUTROL 1 MMOL/ML IV SOLN

[Series 2: FLAIR · sagittal · 3.0mm · 0.47mm/px · 1 of 40 slices shown (1 of 2)]
[im 1/40]
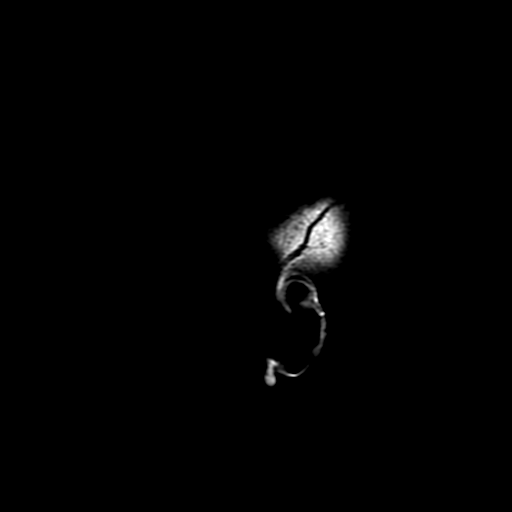

[Series 3: T2 · axial · 5.0mm · 0.43mm/px · 1 of 32 slices shown]
[im 1/32]
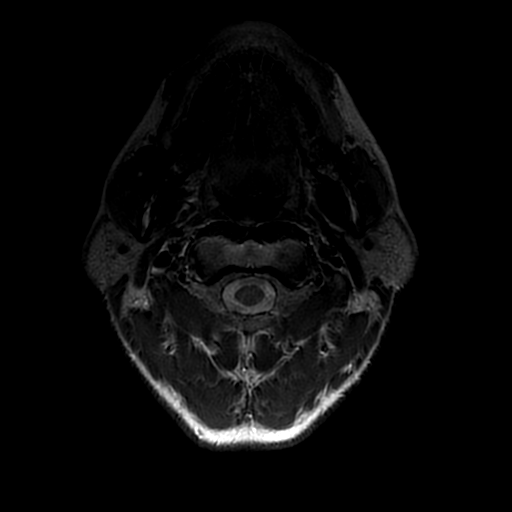

[Series 4: ax dti · axial · 3.0mm · 0.94mm/px · z∈[-37,+151]mm · 14 of 1664 slices shown]
[im 1/1664]
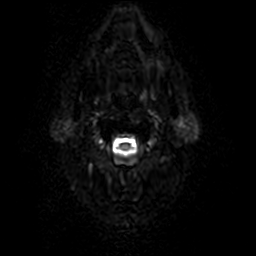
[im 128/1664]
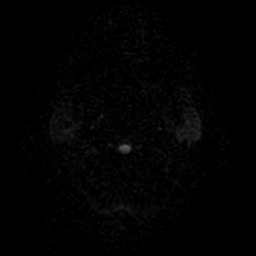
[im 256/1664]
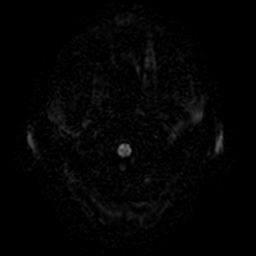
[im 384/1664]
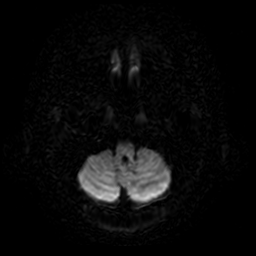
[im 512/1664]
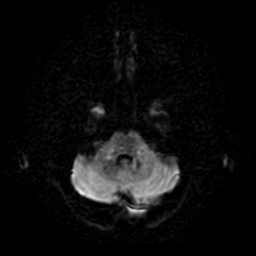
[im 640/1664]
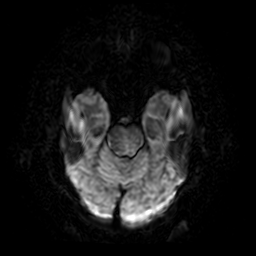
[im 768/1664]
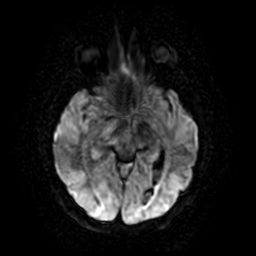
[im 896/1664]
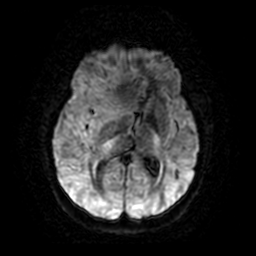
[im 1024/1664]
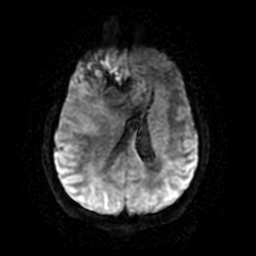
[im 1152/1664]
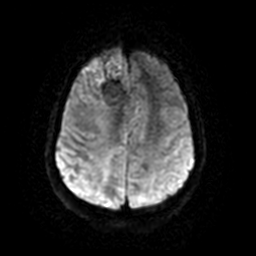
[im 1280/1664]
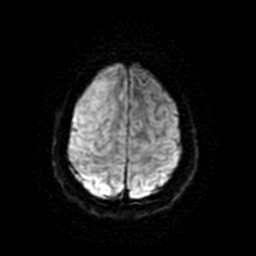
[im 1408/1664]
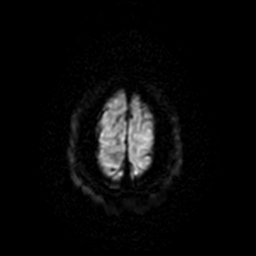
[im 1536/1664]
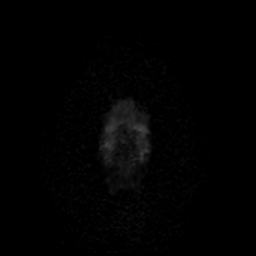
[im 1664/1664]
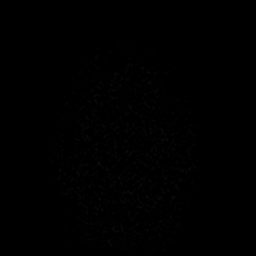

[Series 6: (person_name) · axial · 3.0mm · 0.47mm/px · 1 of 128 slices shown]
[im 1/128]
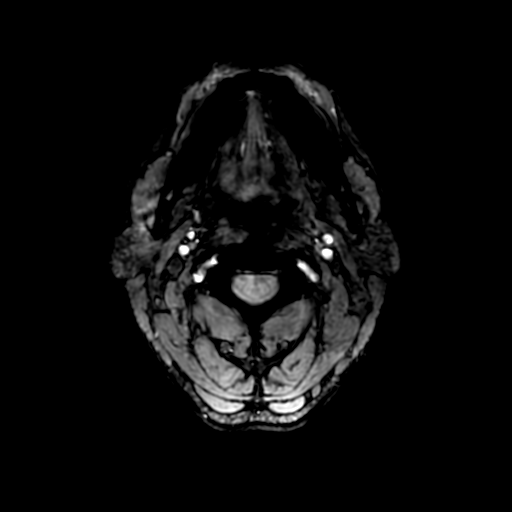

[Series 7: ax 3(person_name) · axial · 1.0mm · 1.02mm/px · 1 of 192 slices shown (1 of 2)]
[im 1/192]
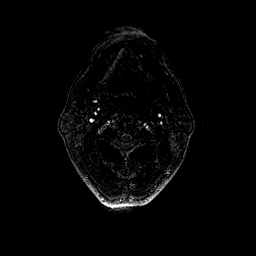

[Series 8: T2 post-contrast · coronal · 3.0mm · 0.39mm/px · 1 of 60 slices shown]
[im 1/60]
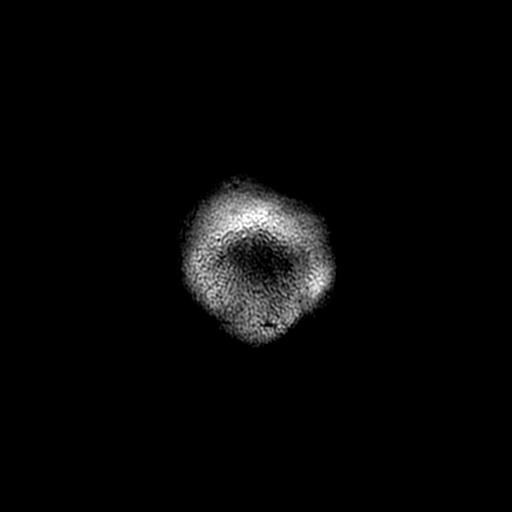

[Series 9: ax 3(person_name) · axial · 1.0mm · 1.02mm/px · 1 of 192 slices shown (2 of 2)]
[im 1/192]
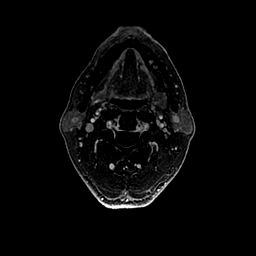

[Series 10: T1 · coronal · 5.0mm · 0.43mm/px · 1 of 30 slices shown]
[im 1/30]
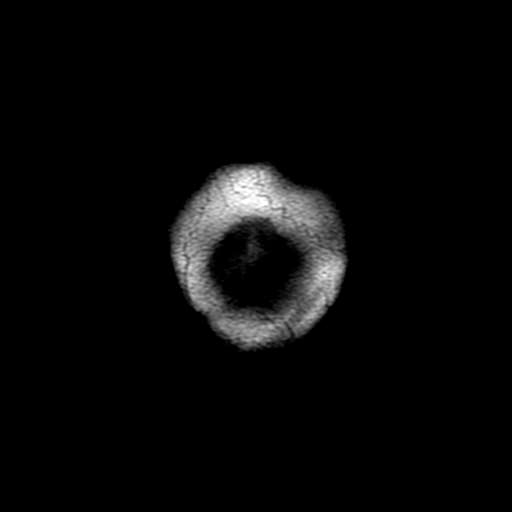

[Series 11: FLAIR · sagittal · 3.0mm · 0.47mm/px · 1 of 40 slices shown (2 of 2)]
[im 1/40]
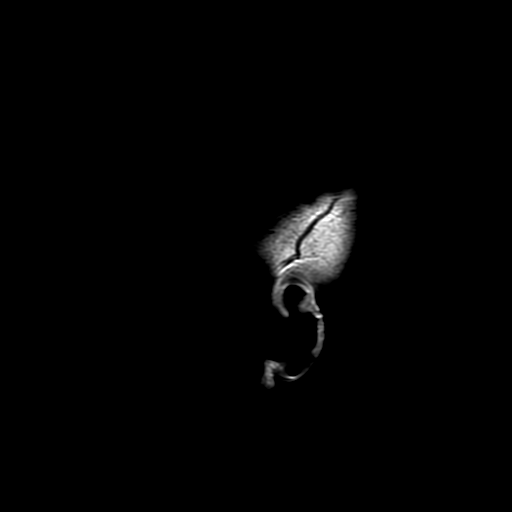

[Series 410: orig: ax dti · axial · 3.0mm · 0.94mm/px · z∈[-37,+151]mm · 8 of 1664 slices shown]
[im 1/1664]
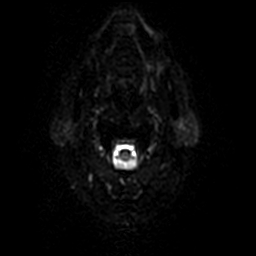
[im 238/1664]
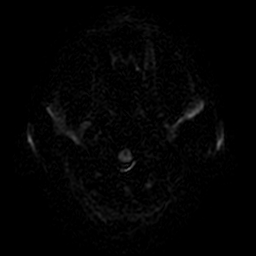
[im 476/1664]
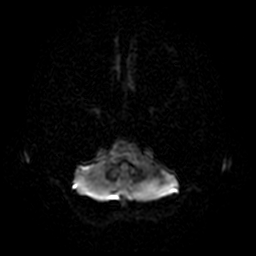
[im 713/1664]
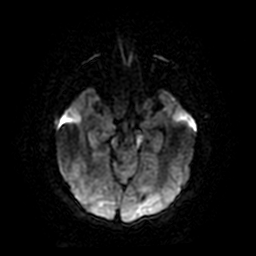
[im 951/1664]
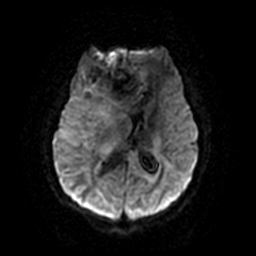
[im 1188/1664]
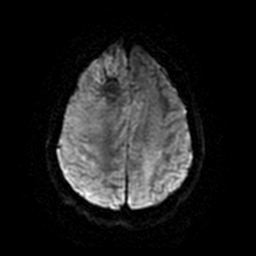
[im 1426/1664]
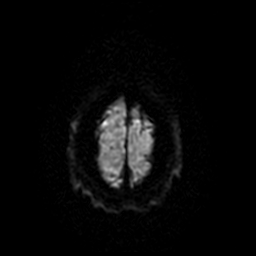
[im 1664/1664]
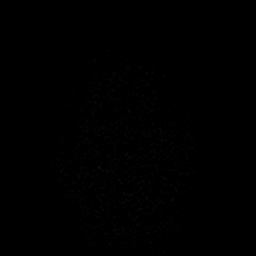

[Series 450: trace · axial · 3.0mm · 0.94mm/px · 1 of 61 slices shown]
[im 1/61]
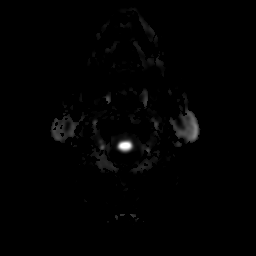

[Series 451: fa(no-q) · axial · 3.0mm · 0.94mm/px · 1 of 57 slices shown]
[im 1/57]
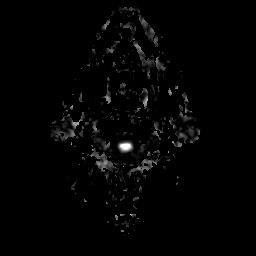

[Series 452: avdc (10^-6 mm²/s)(no-q) · axial · 3.0mm · 0.94mm/px · 1 of 61 slices shown]
[im 1/61]
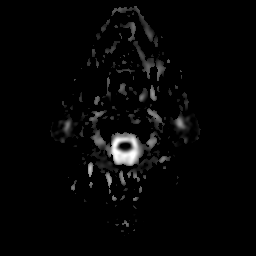

[Series 500: multiplanar reconstruction (mpr) · axial · 1.0mm · 0.50mm/px · z∈[-110,+146]mm · 2 of 257 slices shown (1 of 2)]
[im 1/257]
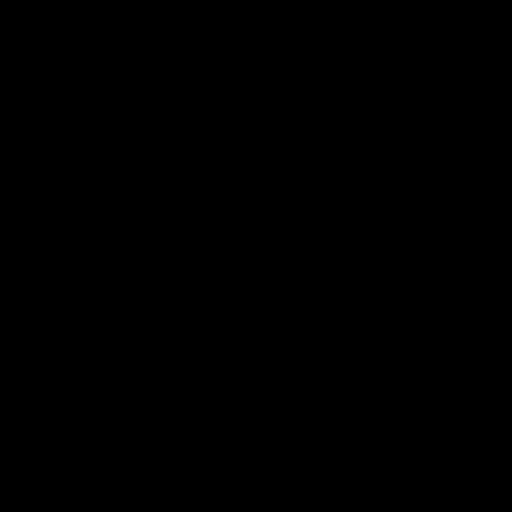
[im 257/257]
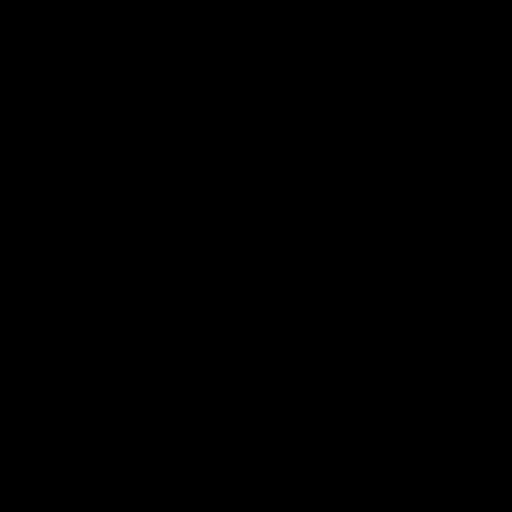

[Series 501: multiplanar reconstruction (mpr) · coronal · 1.0mm · 0.50mm/px · 2 of 244 slices shown (2 of 2)]
[im 1/244]
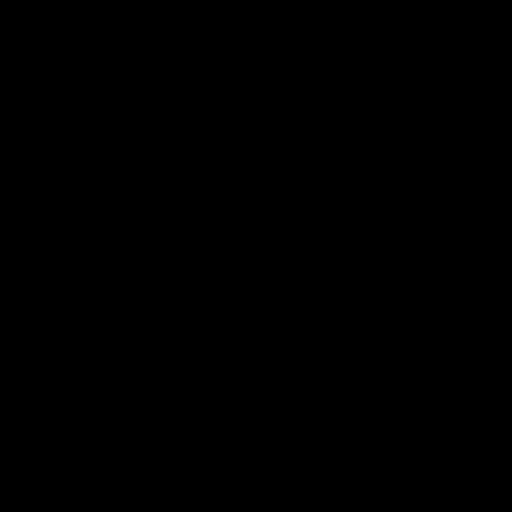
[im 244/244]
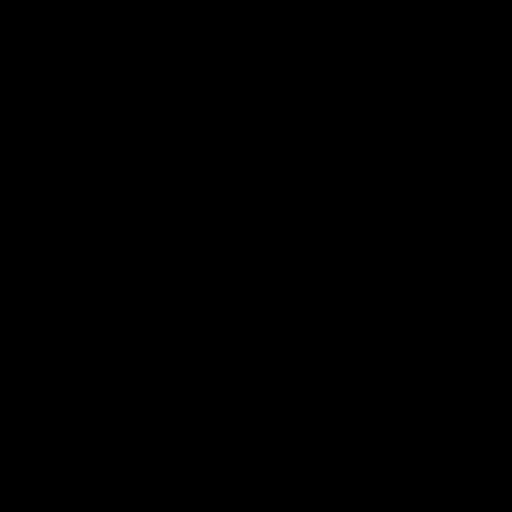

[Series 600: filt_pha: (person_name) · axial · 3.0mm · 0.47mm/px · 1 of 124 slices shown]
[im 1/124]
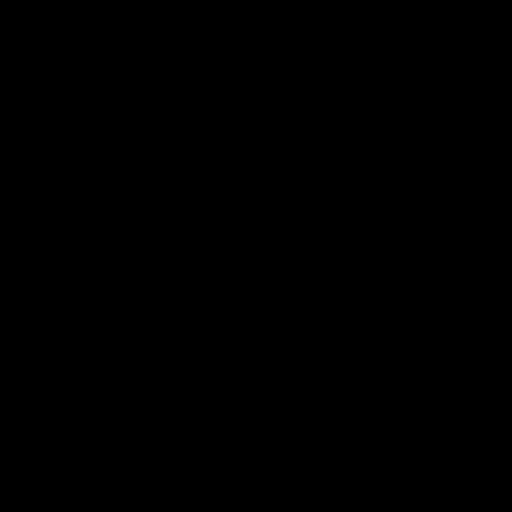

[38 of 48 positions shown; findings below may reference images not displayed]

FINDINGS: Brain: Stable appearance of heterogenously enhancing partially
cystic right frontal mass measuring 6.3 x 5.1 cm. Intralesional
hemorrhage and restricted diffusion is unchanged. Confluent
perilesional T2 hyperintense signal involving the anterior corpus
callosum and traversing the midline is unchanged. Partial effacement
of the right lateral ventricle with 1.8 cm leftward midline shift.

Subfalcine herniation of parenchyma and prominence of the left
lateral ventricle occipital and temporal horns is unchanged. No
extra-axial fluid collection. No other enhancing lesion.

Vascular: Major intracranial flow voids are proximally preserved.
Leftward displacement of the ACAs secondary to mass effect is
unchanged.

Skull and upper cervical spine: Normal marrow signal.

Sinuses/Orbits: Normal orbits. Clear paranasal sinuses. No mastoid
effusion.

Other: None.
IMPRESSION: Stable appearance of 6.3 cm hemorrhagic and cystic right frontal
mass involving the anterior corpus callosum and concerning for
high-grade primary neoplasm.

No other enhancing lesions.

1.8 cm leftward midline shift and prominence of the left lateral
ventricle, unchanged.

## 2021-01-07 MED ORDER — GADOBUTROL 1 MMOL/ML IV SOLN
6.5000 mL | Freq: Once | INTRAVENOUS | Status: AC | PRN
  Administered 2021-01-07: 6.5 mL via INTRAVENOUS

## 2021-01-08 ENCOUNTER — Encounter (HOSPITAL_COMMUNITY): Payer: Self-pay | Admitting: Neurological Surgery

## 2021-01-08 DIAGNOSIS — C711 Malignant neoplasm of frontal lobe: Secondary | ICD-10-CM | POA: Diagnosis not present

## 2021-01-09 ENCOUNTER — Other Ambulatory Visit: Payer: Self-pay

## 2021-01-09 ENCOUNTER — Inpatient Hospital Stay (HOSPITAL_COMMUNITY)
Admission: RE | Admit: 2021-01-09 | Discharge: 2021-01-11 | DRG: 025 | Disposition: A | Discharge status: Home / Self Care | Payer: Medicaid Other | Attending: Neurological Surgery | Admitting: Neurological Surgery

## 2021-01-09 ENCOUNTER — Inpatient Hospital Stay (HOSPITAL_COMMUNITY): Payer: Medicaid Other

## 2021-01-09 ENCOUNTER — Encounter (HOSPITAL_COMMUNITY): Payer: Self-pay | Admitting: Neurological Surgery

## 2021-01-09 ENCOUNTER — Inpatient Hospital Stay (HOSPITAL_COMMUNITY)
Admission: RE | Disposition: A | Discharge status: Home / Self Care | Payer: Self-pay | Source: Home / Self Care | Attending: Neurological Surgery

## 2021-01-09 DIAGNOSIS — G939 Disorder of brain, unspecified: Secondary | ICD-10-CM | POA: Diagnosis present

## 2021-01-09 DIAGNOSIS — D496 Neoplasm of unspecified behavior of brain: Secondary | ICD-10-CM | POA: Diagnosis not present

## 2021-01-09 DIAGNOSIS — Z9889 Other specified postprocedural states: Secondary | ICD-10-CM

## 2021-01-09 DIAGNOSIS — G935 Compression of brain: Secondary | ICD-10-CM | POA: Diagnosis present

## 2021-01-09 DIAGNOSIS — C711 Malignant neoplasm of frontal lobe: Secondary | ICD-10-CM | POA: Diagnosis not present

## 2021-01-09 DIAGNOSIS — F17210 Nicotine dependence, cigarettes, uncomplicated: Secondary | ICD-10-CM | POA: Diagnosis not present

## 2021-01-09 DIAGNOSIS — Z79899 Other long term (current) drug therapy: Secondary | ICD-10-CM

## 2021-01-09 DIAGNOSIS — Z880 Allergy status to penicillin: Secondary | ICD-10-CM

## 2021-01-09 DIAGNOSIS — K529 Noninfective gastroenteritis and colitis, unspecified: Secondary | ICD-10-CM | POA: Diagnosis not present

## 2021-01-09 DIAGNOSIS — D72829 Elevated white blood cell count, unspecified: Secondary | ICD-10-CM | POA: Diagnosis not present

## 2021-01-09 DIAGNOSIS — F688 Other specified disorders of adult personality and behavior: Secondary | ICD-10-CM | POA: Diagnosis not present

## 2021-01-09 HISTORY — PX: APPLICATION OF CRANIAL NAVIGATION: SHX6578

## 2021-01-09 HISTORY — PX: CRANIOTOMY: SHX93

## 2021-01-09 HISTORY — DX: Malignant neoplasm of frontal lobe: C71.1

## 2021-01-09 LAB — POCT I-STAT 7, (LYTES, BLD GAS, ICA,H+H)
Acid-Base Excess: 0 mmol/L (ref 0.0–2.0)
Bicarbonate: 25.2 mmol/L (ref 20.0–28.0)
Calcium, Ion: 1.13 mmol/L — ABNORMAL LOW (ref 1.15–1.40)
HCT: 37 % — ABNORMAL LOW (ref 39.0–52.0)
Hemoglobin: 12.6 g/dL — ABNORMAL LOW (ref 13.0–17.0)
O2 Saturation: 100 %
Potassium: 4.6 mmol/L (ref 3.5–5.1)
Sodium: 139 mmol/L (ref 135–145)
TCO2: 26 mmol/L (ref 22–32)
pCO2 arterial: 43.9 mmHg (ref 32.0–48.0)
pH, Arterial: 7.367 (ref 7.350–7.450)
pO2, Arterial: 284 mmHg — ABNORMAL HIGH (ref 83.0–108.0)

## 2021-01-09 LAB — CREATININE, SERUM
Creatinine, Ser: 1 mg/dL (ref 0.61–1.24)
GFR, Estimated: >60 mL/min (ref >60)

## 2021-01-09 LAB — TYPE AND SCREEN
ABO/RH(D): A POS
Antibody Screen: NEGATIVE

## 2021-01-09 LAB — CBC
HCT: 48.1 % (ref 39.0–52.0)
HCT: 36 % — ABNORMAL LOW (ref 39.0–52.0)
Hemoglobin: 16.3 g/dL (ref 13.0–17.0)
Hemoglobin: 12.6 g/dL — ABNORMAL LOW (ref 13.0–17.0)
MCH: 32 pg (ref 26.0–34.0)
MCH: 33.1 pg (ref 26.0–34.0)
MCHC: 33.9 g/dL (ref 30.0–36.0)
MCHC: 35 g/dL (ref 30.0–36.0)
MCV: 94.3 fL (ref 80.0–100.0)
MCV: 94.5 fL (ref 80.0–100.0)
Platelets: 245 10*3/uL (ref 150–400)
Platelets: 167 10*3/uL (ref 150–400)
RBC: 5.1 MIL/uL (ref 4.22–5.81)
RBC: 3.81 MIL/uL — ABNORMAL LOW (ref 4.22–5.81)
RDW: 12.2 % (ref 11.5–15.5)
RDW: 12.4 % (ref 11.5–15.5)
WBC: 19.1 10*3/uL — ABNORMAL HIGH (ref 4.0–10.5)
WBC: 14.4 10*3/uL — ABNORMAL HIGH (ref 4.0–10.5)
nRBC: 0 % (ref 0.0–0.2)
nRBC: 0 % (ref 0.0–0.2)

## 2021-01-09 LAB — ABO/RH: ABO/RH(D): A POS

## 2021-01-09 SURGERY — CRANIOTOMY TUMOR EXCISION
Anesthesia: General | Site: Head

## 2021-01-09 MED ORDER — PROPOFOL 500 MG/50ML IV EMUL
INTRAVENOUS | Status: DC | PRN
  Administered 2021-01-09: 40 ug/kg/min via INTRAVENOUS

## 2021-01-09 MED ORDER — HEPARIN SODIUM (PORCINE) 5000 UNIT/ML IJ SOLN
5000.0000 [IU] | Freq: Two times a day (BID) | INTRAMUSCULAR | Status: DC
  Administered 2021-01-11: 5000 [IU] via SUBCUTANEOUS
  Filled 2021-01-09: qty 1

## 2021-01-09 MED ORDER — LIDOCAINE-EPINEPHRINE 1 %-1:100000 IJ SOLN
INTRAMUSCULAR | Status: AC
  Filled 2021-01-09: qty 1

## 2021-01-09 MED ORDER — DEXAMETHASONE SODIUM PHOSPHATE 10 MG/ML IJ SOLN
INTRAMUSCULAR | Status: AC
  Administered 2021-01-09: 10 mg via INTRAVENOUS
  Filled 2021-01-09: qty 1

## 2021-01-09 MED ORDER — MICROFIBRILLAR COLL HEMOSTAT EX POWD
CUTANEOUS | Status: AC
  Filled 2021-01-09: qty 5

## 2021-01-09 MED ORDER — CEFAZOLIN SODIUM-DEXTROSE 2-4 GM/100ML-% IV SOLN
INTRAVENOUS | Status: AC
  Filled 2021-01-09: qty 100

## 2021-01-09 MED ORDER — MIDAZOLAM HCL 2 MG/2ML IJ SOLN
INTRAMUSCULAR | Status: AC
  Filled 2021-01-09: qty 2

## 2021-01-09 MED ORDER — DEXAMETHASONE SODIUM PHOSPHATE 4 MG/ML IJ SOLN
4.0000 mg | Freq: Three times a day (TID) | INTRAMUSCULAR | Status: DC
  Filled 2021-01-09: qty 1

## 2021-01-09 MED ORDER — PROMETHAZINE HCL 25 MG PO TABS
12.5000 mg | ORAL_TABLET | ORAL | Status: DC | PRN
Start: 2021-01-09 — End: 2021-01-11

## 2021-01-09 MED ORDER — LABETALOL HCL 5 MG/ML IV SOLN
5.0000 mg | INTRAVENOUS | Status: DC | PRN
  Filled 2021-01-09: qty 4

## 2021-01-09 MED ORDER — ONDANSETRON HCL 4 MG/2ML IJ SOLN
4.0000 mg | Freq: Once | INTRAMUSCULAR | Status: AC
  Administered 2021-01-09: 4 mg via INTRAVENOUS
  Filled 2021-01-09: qty 2

## 2021-01-09 MED ORDER — LIDOCAINE-EPINEPHRINE 1 %-1:100000 IJ SOLN
INTRAMUSCULAR | Status: DC | PRN
  Administered 2021-01-09: 9 mL

## 2021-01-09 MED ORDER — LABETALOL HCL 5 MG/ML IV SOLN
10.0000 mg | INTRAVENOUS | Status: DC | PRN
Start: 2021-01-09 — End: 2021-01-11

## 2021-01-09 MED ORDER — VANCOMYCIN HCL IN DEXTROSE 1-5 GM/200ML-% IV SOLN
INTRAVENOUS | Status: AC
  Administered 2021-01-09: 1000 mg
  Filled 2021-01-09: qty 200

## 2021-01-09 MED ORDER — SCOPOLAMINE 1 MG/3DAYS TD PT72
1.0000 | MEDICATED_PATCH | TRANSDERMAL | Status: DC
  Administered 2021-01-09: 1.5 mg via TRANSDERMAL
  Filled 2021-01-09: qty 1

## 2021-01-09 MED ORDER — VANCOMYCIN HCL 1000 MG/200ML IV SOLN
1000.0000 mg | Freq: Once | INTRAVENOUS | Status: DC
  Filled 2021-01-09: qty 200

## 2021-01-09 MED ORDER — CHLORHEXIDINE GLUCONATE CLOTH 2 % EX PADS
6.0000 | MEDICATED_PAD | Freq: Every day | CUTANEOUS | Status: DC
  Administered 2021-01-11: 6 via TOPICAL

## 2021-01-09 MED ORDER — ACETAMINOPHEN 325 MG PO TABS: 650.0000 mg | ORAL_TABLET | ORAL | Status: DC | PRN

## 2021-01-09 MED ORDER — PROPOFOL 10 MG/ML IV BOLUS
INTRAVENOUS | Status: AC
  Filled 2021-01-09: qty 20

## 2021-01-09 MED ORDER — BACITRACIN ZINC 500 UNIT/GM EX OINT
TOPICAL_OINTMENT | CUTANEOUS | Status: AC
  Filled 2021-01-09: qty 28.35

## 2021-01-09 MED ORDER — LACTATED RINGERS IV SOLN: INTRAVENOUS | Status: DC | PRN

## 2021-01-09 MED ORDER — THROMBIN 20000 UNITS EX KIT: PACK | CUTANEOUS | Status: DC | PRN

## 2021-01-09 MED ORDER — MANNITOL 25 % IV SOLN
INTRAVENOUS | Status: DC | PRN
  Administered 2021-01-09: 50 g via INTRAVENOUS

## 2021-01-09 MED ORDER — SENNOSIDES-DOCUSATE SODIUM 8.6-50 MG PO TABS: 1.0000 | ORAL_TABLET | Freq: Every evening | ORAL | Status: DC | PRN

## 2021-01-09 MED ORDER — OXYCODONE HCL 5 MG/5ML PO SOLN: 5.0000 mg | Freq: Once | ORAL | Status: DC | PRN

## 2021-01-09 MED ORDER — MICROFIBRILLAR COLL HEMOSTAT EX PADS: MEDICATED_PAD | CUTANEOUS | Status: DC | PRN

## 2021-01-09 MED ORDER — LEVETIRACETAM IN NACL 1000 MG/100ML IV SOLN
1000.0000 mg | Freq: Once | INTRAVENOUS | Status: DC
  Filled 2021-01-09: qty 100

## 2021-01-09 MED ORDER — BUPIVACAINE-EPINEPHRINE 0.25% -1:200000 IJ SOLN
INTRAMUSCULAR | Status: DC | PRN
  Administered 2021-01-09: 9 mL

## 2021-01-09 MED ORDER — PANTOPRAZOLE SODIUM 40 MG IV SOLR
40.0000 mg | Freq: Every day | INTRAVENOUS | Status: DC
  Administered 2021-01-09: 40 mg via INTRAVENOUS
  Filled 2021-01-09: qty 40

## 2021-01-09 MED ORDER — PROPOFOL 1000 MG/100ML IV EMUL
INTRAVENOUS | Status: AC
  Filled 2021-01-09: qty 100

## 2021-01-09 MED ORDER — SUCCINYLCHOLINE CHLORIDE 200 MG/10ML IV SOSY
PREFILLED_SYRINGE | INTRAVENOUS | Status: DC | PRN
  Administered 2021-01-09: 180 mg via INTRAVENOUS

## 2021-01-09 MED ORDER — ALBUMIN HUMAN 5 % IV SOLN: INTRAVENOUS | Status: DC | PRN

## 2021-01-09 MED ORDER — SODIUM CHLORIDE 0.9 % IV SOLN: INTRAVENOUS | Status: DC

## 2021-01-09 MED ORDER — MICROFIBRILLAR COLL HEMOSTAT EX PADS
MEDICATED_PAD | CUTANEOUS | Status: DC | PRN
  Administered 2021-01-09: 1 via TOPICAL

## 2021-01-09 MED ORDER — SODIUM CHLORIDE 0.9 % IV SOLN
INTRAVENOUS | Status: DC | PRN
  Administered 2021-01-09: 1000 mg via INTRAVENOUS

## 2021-01-09 MED ORDER — ONDANSETRON HCL 4 MG/2ML IJ SOLN
INTRAMUSCULAR | Status: DC | PRN
  Administered 2021-01-09: 4 mg via INTRAVENOUS

## 2021-01-09 MED ORDER — OXYCODONE HCL 5 MG PO TABS: 5.0000 mg | ORAL_TABLET | Freq: Once | ORAL | Status: DC | PRN

## 2021-01-09 MED ORDER — ONDANSETRON HCL 4 MG/2ML IJ SOLN
INTRAMUSCULAR | Status: AC
  Filled 2021-01-09: qty 2

## 2021-01-09 MED ORDER — HYDROMORPHONE HCL 1 MG/ML IJ SOLN: 0.2500 mg | INTRAMUSCULAR | Status: DC | PRN

## 2021-01-09 MED ORDER — FENTANYL CITRATE (PF) 250 MCG/5ML IJ SOLN
INTRAMUSCULAR | Status: AC
  Filled 2021-01-09: qty 5

## 2021-01-09 MED ORDER — DEXAMETHASONE SODIUM PHOSPHATE 10 MG/ML IJ SOLN
10.0000 mg | Freq: Once | INTRAMUSCULAR | Status: AC
  Filled 2021-01-09: qty 1

## 2021-01-09 MED ORDER — LEVETIRACETAM IN NACL 500 MG/100ML IV SOLN
500.0000 mg | Freq: Two times a day (BID) | INTRAVENOUS | Status: DC
  Administered 2021-01-09 – 2021-01-11 (×4): 500 mg via INTRAVENOUS
  Filled 2021-01-09 (×4): qty 100

## 2021-01-09 MED ORDER — BACITRACIN ZINC 500 UNIT/GM EX OINT
TOPICAL_OINTMENT | CUTANEOUS | Status: DC | PRN
  Administered 2021-01-09: 1 via TOPICAL

## 2021-01-09 MED ORDER — BUPIVACAINE-EPINEPHRINE (PF) 0.25% -1:200000 IJ SOLN
INTRAMUSCULAR | Status: AC
  Filled 2021-01-09: qty 30

## 2021-01-09 MED ORDER — 0.9 % SODIUM CHLORIDE (POUR BTL) OPTIME
TOPICAL | Status: DC | PRN
  Administered 2021-01-09: 3000 mL

## 2021-01-09 MED ORDER — FENTANYL CITRATE (PF) 100 MCG/2ML IJ SOLN
INTRAMUSCULAR | Status: DC | PRN
  Administered 2021-01-09: 100 ug via INTRAVENOUS
  Administered 2021-01-09: 50 ug via INTRAVENOUS
  Administered 2021-01-09: 25 ug via INTRAVENOUS
  Administered 2021-01-09: 50 ug via INTRAVENOUS
  Administered 2021-01-09: 25 ug via INTRAVENOUS

## 2021-01-09 MED ORDER — EPHEDRINE SULFATE-NACL 50-0.9 MG/10ML-% IV SOSY
PREFILLED_SYRINGE | INTRAVENOUS | Status: DC | PRN
  Administered 2021-01-09: 10 mg via INTRAVENOUS
  Administered 2021-01-09: 7.5 mg via INTRAVENOUS
  Administered 2021-01-09: 5 mg via INTRAVENOUS

## 2021-01-09 MED ORDER — DEXAMETHASONE SODIUM PHOSPHATE 10 MG/ML IJ SOLN
6.0000 mg | Freq: Four times a day (QID) | INTRAMUSCULAR | Status: AC
  Administered 2021-01-09 – 2021-01-10 (×4): 6 mg via INTRAVENOUS
  Filled 2021-01-09 (×4): qty 1

## 2021-01-09 MED ORDER — MORPHINE SULFATE (PF) 2 MG/ML IV SOLN
1.0000 mg | INTRAVENOUS | Status: DC | PRN
  Administered 2021-01-09 – 2021-01-11 (×11): 2 mg via INTRAVENOUS
  Filled 2021-01-09 (×11): qty 1

## 2021-01-09 MED ORDER — HEMOSTATIC AGENTS (NO CHARGE) OPTIME
TOPICAL | Status: DC | PRN
  Administered 2021-01-09 (×3): 1 via TOPICAL

## 2021-01-09 MED ORDER — LIDOCAINE 2% (20 MG/ML) 5 ML SYRINGE
INTRAMUSCULAR | Status: DC | PRN
  Administered 2021-01-09: 100 mg via INTRAVENOUS

## 2021-01-09 MED ORDER — GLYCOPYRROLATE PF 0.2 MG/ML IJ SOSY
PREFILLED_SYRINGE | INTRAMUSCULAR | Status: DC | PRN
  Administered 2021-01-09: .2 mg via INTRAVENOUS

## 2021-01-09 MED ORDER — SODIUM CHLORIDE 0.9 % IV SOLN
0.0500 ug/kg/min | INTRAVENOUS | Status: DC
  Administered 2021-01-09: .1 ug/kg/min via INTRAVENOUS
  Filled 2021-01-09 (×2): qty 5000

## 2021-01-09 MED ORDER — AMISULPRIDE (ANTIEMETIC) 5 MG/2ML IV SOLN: 10.0000 mg | Freq: Once | INTRAVENOUS | Status: DC | PRN

## 2021-01-09 MED ORDER — ACETAMINOPHEN 10 MG/ML IV SOLN
INTRAVENOUS | Status: DC | PRN
  Administered 2021-01-09: 1000 mg via INTRAVENOUS

## 2021-01-09 MED ORDER — CHLORHEXIDINE GLUCONATE 0.12 % MT SOLN
OROMUCOSAL | Status: AC
  Administered 2021-01-09: 15 mL via OROMUCOSAL
  Filled 2021-01-09: qty 15

## 2021-01-09 MED ORDER — THROMBIN 20000 UNITS EX SOLR
CUTANEOUS | Status: AC
  Filled 2021-01-09: qty 20000

## 2021-01-09 MED ORDER — DEXAMETHASONE SODIUM PHOSPHATE 4 MG/ML IJ SOLN
4.0000 mg | Freq: Four times a day (QID) | INTRAMUSCULAR | Status: AC
  Administered 2021-01-10 – 2021-01-11 (×4): 4 mg via INTRAVENOUS
  Filled 2021-01-09 (×3): qty 1

## 2021-01-09 MED ORDER — VANCOMYCIN HCL 1000 MG/200ML IV SOLN
1000.0000 mg | Freq: Two times a day (BID) | INTRAVENOUS | Status: AC
  Administered 2021-01-09 – 2021-01-10 (×2): 1000 mg via INTRAVENOUS
  Filled 2021-01-09: qty 200

## 2021-01-09 MED ORDER — ORAL CARE MOUTH RINSE: 15.0000 mL | Freq: Once | OROMUCOSAL | Status: AC

## 2021-01-09 MED ORDER — ONDANSETRON HCL 4 MG/2ML IJ SOLN: 4.0000 mg | INTRAMUSCULAR | Status: DC | PRN

## 2021-01-09 MED ORDER — THROMBIN 5000 UNITS EX SOLR: OROMUCOSAL | Status: DC | PRN

## 2021-01-09 MED ORDER — PROMETHAZINE HCL 25 MG/ML IJ SOLN: 6.2500 mg | INTRAMUSCULAR | Status: DC | PRN

## 2021-01-09 MED ORDER — ROCURONIUM BROMIDE 10 MG/ML (PF) SYRINGE
PREFILLED_SYRINGE | INTRAVENOUS | Status: DC | PRN
  Administered 2021-01-09 (×2): 50 mg via INTRAVENOUS
  Administered 2021-01-09: 20 mg via INTRAVENOUS
  Administered 2021-01-09: 50 mg via INTRAVENOUS

## 2021-01-09 MED ORDER — PROPOFOL 10 MG/ML IV BOLUS
INTRAVENOUS | Status: DC | PRN
  Administered 2021-01-09: 200 mg via INTRAVENOUS

## 2021-01-09 MED ORDER — ACETAMINOPHEN 650 MG RE SUPP: 650.0000 mg | RECTAL | Status: DC | PRN

## 2021-01-09 MED ORDER — CHLORHEXIDINE GLUCONATE 0.12 % MT SOLN: 15.0000 mL | Freq: Once | OROMUCOSAL | Status: AC

## 2021-01-09 MED ORDER — HYDROCODONE-ACETAMINOPHEN 5-325 MG PO TABS
1.0000 | ORAL_TABLET | ORAL | Status: DC | PRN
  Administered 2021-01-09 – 2021-01-10 (×4): 1 via ORAL
  Filled 2021-01-09 (×5): qty 1

## 2021-01-09 MED ORDER — THROMBIN (RECOMBINANT) 5000 UNITS EX SOLR
CUTANEOUS | Status: AC
  Filled 2021-01-09: qty 5000

## 2021-01-09 MED ORDER — ONDANSETRON HCL 4 MG PO TABS
4.0000 mg | ORAL_TABLET | ORAL | Status: DC | PRN
  Filled 2021-01-09: qty 1

## 2021-01-09 SURGICAL SUPPLY — 79 items
BAND RUBBER #18 3X1/16 STRL (MISCELLANEOUS) ×6 IMPLANT
BIT DRILL WIRE PASS 1.3MM (BIT) IMPLANT
BLADE SURG 11 STRL SS (BLADE) ×3 IMPLANT
BUR CARBIDE MATCH 3.0 (BURR) ×6 IMPLANT
BUR SPIRAL ROUTER 2.3 (BUR) ×3 IMPLANT
CANISTER SUCT 3000ML PPV (MISCELLANEOUS) ×3 IMPLANT
COVER BURR HOLE 20 W/TAB UNI (Plate) ×3 IMPLANT
COVER BURR HOLE UNIV 10 (Orthopedic Implant) ×3 IMPLANT
DRAIN JACKSON PRATT 1/4 1325 (MISCELLANEOUS) IMPLANT
DRAIN JACKSON RD 7FR 3/32 (WOUND CARE) IMPLANT
DRAPE MICROSCOPE LEICA (MISCELLANEOUS) ×3 IMPLANT
DRAPE NEUROLOGICAL W/INCISE (DRAPES) ×3 IMPLANT
DRAPE SHEET LG 3/4 BI-LAMINATE (DRAPES) ×3 IMPLANT
DRAPE WARM FLUID 44X44 (DRAPES) ×3 IMPLANT
DRILL WIRE PASS 1.3MM (BIT)
DRSG TELFA 3X8 NADH (GAUZE/BANDAGES/DRESSINGS) ×3 IMPLANT
DURAPREP 6ML APPLICATOR 50/CS (WOUND CARE) ×3 IMPLANT
ELECT BLADE INSULATED 4IN (ELECTROSURGICAL) ×3
ELECT REM PT RETURN 9FT ADLT (ELECTROSURGICAL) ×3
ELECTRODE BLADE INSULATED 4IN (ELECTROSURGICAL) ×2 IMPLANT
ELECTRODE REM PT RTRN 9FT ADLT (ELECTROSURGICAL) ×2 IMPLANT
EVACUATOR SILICONE 100CC (DRAIN) IMPLANT
FORCEPS BIPO MALIS IRRIG 9X1.5 (NEUROSURGERY SUPPLIES) ×3 IMPLANT
GAUZE SPONGE 4X4 12PLY STRL (GAUZE/BANDAGES/DRESSINGS) IMPLANT
GLOVE ECLIPSE 8.0 STRL XLNG CF (GLOVE) ×15 IMPLANT
GLOVE INDICATOR 8.0 STRL GRN (GLOVE) ×9 IMPLANT
GLOVE SRG 8 PF TXTR STRL LF DI (GLOVE) ×4 IMPLANT
GLOVE SURG UNDER POLY LF SZ8 (GLOVE) ×2
GOWN STRL REUS W/ TWL LRG LVL3 (GOWN DISPOSABLE) ×4 IMPLANT
GOWN STRL REUS W/ TWL XL LVL3 (GOWN DISPOSABLE) ×6 IMPLANT
GOWN STRL REUS W/TWL LRG LVL3 (GOWN DISPOSABLE) ×2
GOWN STRL REUS W/TWL XL LVL3 (GOWN DISPOSABLE) ×3
GRAFT DURAGEN MATRIX 3WX3L (Graft) ×1 IMPLANT
GRAFT DURAGEN MATRIX 3X3 SNGL (Graft) ×2 IMPLANT
HEMOSTAT POWDER KIT SURGIFOAM (HEMOSTASIS) ×3 IMPLANT
HEMOSTAT SNOW SURGICEL 2X4 (HEMOSTASIS) ×3 IMPLANT
HEMOSTAT SURGICEL 2X14 (HEMOSTASIS) ×3 IMPLANT
IV NS 1000ML (IV SOLUTION) ×1
IV NS 1000ML BAXH (IV SOLUTION) ×2 IMPLANT
KIT BASIN OR (CUSTOM PROCEDURE TRAY) ×3 IMPLANT
KIT TURNOVER KIT B (KITS) ×3 IMPLANT
MARKER SPHERE PSV REFLC 13MM (MARKER) ×6 IMPLANT
NEEDLE HYPO 22GX1.5 SAFETY (NEEDLE) ×3 IMPLANT
NS IRRIG 1000ML POUR BTL (IV SOLUTION) ×3 IMPLANT
PACK CRANIOTOMY CUSTOM (CUSTOM PROCEDURE TRAY) ×3 IMPLANT
PATTIES SURGICAL .5 X.5 (GAUZE/BANDAGES/DRESSINGS) IMPLANT
PATTIES SURGICAL .5 X3 (DISPOSABLE) ×3 IMPLANT
PATTIES SURGICAL 1X1 (DISPOSABLE) IMPLANT
PERFORATOR LRG  14-11MM (BIT) ×1
PERFORATOR LRG 14-11MM (BIT) ×2 IMPLANT
PIN MAYFIELD SKULL DISP (PIN) ×3 IMPLANT
PLATE UNIV CMF 16 2H (Plate) ×9 IMPLANT
RETRACTOR LONE STAR DISPOSABLE (INSTRUMENTS) ×9 IMPLANT
SCREW UNIII AXS SD 1.5X4 (Screw) ×45 IMPLANT
SEALANT ADHERUS EXTEND TIP (MISCELLANEOUS) ×3 IMPLANT
SET CARTRIDGE AND TUBING (SET/KITS/TRAYS/PACK) IMPLANT
SET TUBING IRRIGATION DISP (TUBING) ×3 IMPLANT
SPONGE NEURO XRAY DETECT 1X3 (DISPOSABLE) IMPLANT
SPONGE SURGIFOAM ABS GEL 100 (HEMOSTASIS) ×6 IMPLANT
STAPLER VISISTAT 35W (STAPLE) ×3 IMPLANT
STOCKINETTE 6  STRL (DRAPES) ×1
STOCKINETTE 6 STRL (DRAPES) ×2 IMPLANT
STRIP CLOSURE SKIN 1/2X4 (GAUZE/BANDAGES/DRESSINGS) ×3 IMPLANT
SUT ETHILON 3 0 FSL (SUTURE) IMPLANT
SUT ETHILON 3 0 PS 1 (SUTURE) IMPLANT
SUT NURALON 4 0 TR CR/8 (SUTURE) ×6 IMPLANT
SUT VIC AB 0 CT1 18XCR BRD8 (SUTURE) ×2 IMPLANT
SUT VIC AB 0 CT1 8-18 (SUTURE) ×1
SUT VIC AB 2-0 CP2 18 (SUTURE) ×3 IMPLANT
SUT VICRYL RAPIDE 4/0 PS 2 (SUTURE) ×3 IMPLANT
TIP SHEAR CVD EXTENDED 36KH (INSTRUMENTS) IMPLANT
TOWEL GREEN STERILE (TOWEL DISPOSABLE) ×3 IMPLANT
TOWEL GREEN STERILE FF (TOWEL DISPOSABLE) ×3 IMPLANT
TRAY FOLEY MTR SLVR 16FR STAT (SET/KITS/TRAYS/PACK) ×3 IMPLANT
TUBE CONNECTING 12X1/4 (SUCTIONS) ×3 IMPLANT
TUBE CONNECTING 20X1/4 (TUBING) ×3 IMPLANT
UNDERPAD 30X36 HEAVY ABSORB (UNDERPADS AND DIAPERS) IMPLANT
WATER STERILE IRR 1000ML POUR (IV SOLUTION) ×3 IMPLANT
WRENCH TORQUE 36KHZ (INSTRUMENTS) IMPLANT

## 2021-01-09 NOTE — Progress Notes (Signed)
   Providing Compassionate, Quality Care - Together  NEUROSURGERY PROGRESS NOTE   S: pt s/e in pacu, VSS  O: EXAM:  BP 117/64 (BP Location: Right Arm)   Pulse 70   Temp 97.7 F (36.5 C)   Resp 19   Ht 5\' 7"  (1.702 m)   Wt 68 kg   SpO2 99%   BMI 23.49 kg/m   Awake, alert Speech fluent, appropriate  CNs grossly intact  PERRL Incision c/d/i 5/5 BUE/BLE   ASSESSMENT:  50 y.o. male with   1. Large Right frontal tumor  S/p right frontal crani for tumor resection 01/09/2021  PLAN: - nsicu -SBP <150 -mri tomorrow -decadron -keppra -pain control    Thank you for allowing me to participate in this patient's care.  Please do not hesitate to call with questions or concerns.   Elwin Sleight, [facility] Cell: [phone]

## 2021-01-09 NOTE — Progress Notes (Addendum)
Pharmacy Antibiotic Note  Troy Leon is a 50 y.o. 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  Pharmacy has been consulted for vancomycin dosing for 24 hrs of post-op surgical prophylaxis.  WBC 14.4, afebrile; Scr 0.97, CrCl 85.4 ml/min  Pt rec'd vancomycin 1 gm IV X 1 pre op at 1031 AM today; per surgeon note, pt has no drains in place post op (confirmed with RN)  Plan: Vancomycin 1 gm IV Q 12 hrs X 2 doses, first dose scheduled for ~12 hrs after pre op dose  Height: 5\' 7"  (170.2 cm) Weight: 68 kg (150 lb) IBW/kg (Calculated) : 66.1  Temp (24hrs), Avg:97.7 F (36.5 C), Min:97.6 F (36.4 C), Max:97.7 F (36.5 C)  Recent Labs  Lab 01/09/21 0931 01/09/21 1800  WBC 19.1* 14.4*  CREATININE  --  1.00    Estimated Creatinine Clearance: 85.4 mL/min (by C-G formula based on SCr of 1 mg/dL).    Allergies: Penicillins  Microbiology results: 2/26 COVID: negative  Thank you for allowing pharmacy to be a part of this patient's care.  Gillermina Hu, PharmD, BCPS, [facility] Clinical Pharmacist 01/09/2021 7:27 PM

## 2021-01-09 NOTE — Anesthesia Procedure Notes (Signed)
Procedure Name: Intubation Date/Time: 01/09/2021 11:37 AM Performed by: Georgia Duff, CRNA Pre-anesthesia Checklist: Patient identified, Emergency Drugs available, Suction available and Patient being monitored Patient Re-evaluated:Patient Re-evaluated prior to induction Oxygen Delivery Method: Circle System Utilized Preoxygenation: Pre-oxygenation with 100% oxygen Induction Type: IV induction Ventilation: Mask ventilation without difficulty Laryngoscope Size: Miller and 2 Grade View: Grade I Tube type: Oral Tube size: 7.5 mm Number of attempts: 1 Airway Equipment and Method: Stylet and Oral airway Placement Confirmation: ETT inserted through vocal cords under direct vision,  positive ETCO2 and breath sounds checked- equal and bilateral Secured at: 22 cm Tube secured with: Tape Dental Injury: Teeth and Oropharynx as per pre-operative assessment

## 2021-01-09 NOTE — H&P (Signed)
Providing Compassionate, Quality Care - Together  NEUROSURGERY HISTORY & PHYSICAL   Troy Leon is an 50 y.o. male.   Chief Complaint: Headaches, personality changes HPI: This is a 50 year old male, right-handed, that over the past few months as well as last year has had some personality changes per his wife.  Over the past 3 months he had had worsening headaches.  He also complains of episodes of intermittent difficulty with motor function where he feels as though the messages not being related to his extremities.  He has no significant past medical history.  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  Patient presents today for surgical resection, right frontal lobectomy.  He complains of nausea and vomiting this morning, he did skip his dose of Decadron overnight, however he did start his Keppra yesterday.  He denies any seizure-like activity.  He does smoke half a pack per day.  Past Medical History:  Diagnosis Date  . Pancreatitis     Past Surgical History:  Procedure Laterality Date  . CHOLECYSTECTOMY      Family History  Problem Relation Age of Onset  . Diabetes Mother   . Diabetes Other   . Heart attack Other   . Hypertension Other    Social History:  reports that he has been smoking cigarettes. He has been smoking about 0.50 packs per day. He has never used smokeless tobacco. He reports current drug use. Frequency: 2.00 times per week. Drug: Marijuana. He reports that he does not drink alcohol.  Allergies:  PCN  Medications Prior to Admission  Medication Sig Dispense Refill  . dexamethasone (DECADRON) 6 MG tablet 6 mg every 8 hours (Patient taking differently: Take 6 mg by mouth 3 (three) times daily. 6 mg every 8 hours) 30 tablet 2  . ibuprofen (ADVIL) 200 MG tablet Take 800 mg by mouth every 8 (eight) hours as needed (pain.).     Marland Kitchen indomethacin (INDOCIN) 25 MG capsule May take 1 every 8 hours as needed for severe pain do not take with ibuprofen (Patient taking differently: Take 25 mg by mouth 3 (three) times daily as needed (severe pain.).) 30 capsule 0  . busPIRone (BUSPAR) 10 MG tablet One 2 times a day as needed for anxiety (Patient not taking: No sig reported) 45 tablet 1    Results for orders placed or performed during the hospital encounter of 01/09/21 (from the past 48 hour(s))  Type and screen [place]     Status: None   Collection Time: 01/09/21  9:25 AM  Result Value Ref Range   ABO/RH(D) A POS    Antibody Screen NEG    Sample Expiration 01/23/2021,2359    Extend sample reason      NO TRANSFUSIONS OR PREGNANCY IN THE PAST 3 MONTHS Performed at [facility], [address] [place], [place] [address]   ABO/Rh     Status: None   Collection Time: 01/09/21  9:30 AM  Result Value Ref Range   ABO/RH(D)      A POS Performed at [facility] [address] [place], [place] [address]   CBC per protocol     Status: Abnormal   Collection Time: 01/09/21  9:31 AM  Result Value Ref Range   WBC 19.1 (H) 4.0 - 10.5 K/uL   RBC 5.10  4.22 - 5.81 MIL/uL   Hemoglobin 16.3 13.0 - 17.0 g/dL   HCT 48.1 39.0 - 52.0 %   MCV 94.3 80.0 - 100.0 fL   MCH 32.0 26.0 - 34.0 pg   MCHC 33.9 30.0 - 36.0 g/dL   RDW 12.2 11.5 - 15.5 %   Platelets 245 150 - 400 K/uL   nRBC 0.0 0.0 - 0.2 %    Comment: Performed at [facility], [place] [address] [place], [place] [address]   MR BRAIN W WO CONTRAST  Result Date: 01/07/2021 CLINICAL DATA:  Right frontal mass. EXAM: MRI HEAD WITHOUT AND WITH CONTRAST TECHNIQUE: Multiplanar, multiecho pulse sequences of the brain and surrounding structures were obtained without and with intravenous contrast. CONTRAST:  6.73mL GADAVIST GADOBUTROL 1 MMOL/ML IV SOLN COMPARISON:  01/04/2021 and prior. FINDINGS: Brain: Stable appearance of heterogenously enhancing  partially cystic right frontal mass measuring 6.3 x 5.1 cm. Intralesional hemorrhage and restricted diffusion is unchanged. Confluent perilesional T2 hyperintense signal involving the anterior corpus callosum and traversing the midline is unchanged. Partial effacement of the right lateral ventricle with 1.8 cm leftward midline shift. Subfalcine herniation of parenchyma and prominence of the left lateral ventricle occipital and temporal horns is unchanged. No extra-axial fluid collection. No other enhancing lesion. Vascular: Major intracranial flow voids are proximally preserved. Leftward displacement of the ACAs secondary to mass effect is unchanged. Skull and upper cervical spine: Normal marrow signal. Sinuses/Orbits: Normal orbits. Clear paranasal sinuses. No mastoid effusion. Other: None. IMPRESSION: Stable appearance of 6.3 cm hemorrhagic and cystic right frontal mass involving the anterior corpus callosum and concerning for high-grade primary neoplasm. No other enhancing lesions. 1.8 cm leftward midline shift and prominence of the left lateral ventricle, unchanged. Electronically Signed   By: Primitivo Gauze M.D.   On: 01/07/2021 14:20    ROS 14 point review of systems was obtained which all pertinent positives and negatives are listed in HPI above   Blood pressure (!) 165/90, pulse (!) 55, temperature 97.6 F (36.4 C), temperature source Oral, resp. rate 17, height 5\' 7"  (1.702 m), weight 68 kg, SpO2 100 %. Physical Exam  Lethargic, awakens easily, communicates appropriately PERRLA EOMI Cranial nerves II through XII intact Bilateral upper/lower extremity 5/5 strength Left pronator drift, slight Sensory intact to light touch Face symmetric  Assessment/Plan 50 year old male with  1.  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 -I discussed with the  patient and his wife yesterday in the office about all of the expected outcomes pending the pathology of this tumor.  We did discuss that likely it is either an intermediate or high-grade glioma.  We also discussed all the risks, benefits and alternatives of surgery.  These included but were not limited to infection, heart attack, death, hematoma, temporary or permanent paralysis, need for more surgery, stroke, seizures.  They agreed to proceed with surgical intervention.    Elwin Sleight, [facility] Cell: [phone]

## 2021-01-09 NOTE — Progress Notes (Signed)
Pt admitted from PACU. He is alert, oriented, and impulsive. VSS. Non-focal. Wife at bedside.

## 2021-01-09 NOTE — Anesthesia Preprocedure Evaluation (Addendum)
Anesthesia Evaluation  Patient identified by MRN, date of birth, ID band Patient awake    Reviewed: Allergy & Precautions, H&P , NPO status , Patient's Chart, lab work & pertinent test results  Airway Mallampati: II  TM Distance: >3 FB Neck ROM: Full    Dental no notable dental hx.    Pulmonary neg pulmonary ROS, Current Smoker,    Pulmonary exam normal breath sounds clear to auscultation       Cardiovascular negative cardio ROS Normal cardiovascular exam Rhythm:Regular Rate:Normal  EKG: NSR, SB 50   Neuro/Psych  Headaches, negative psych ROS   GI/Hepatic (+)     substance abuse  marijuana use, H/o pancreatitis   Endo/Other  negative endocrine ROS  Renal/GU negative Renal ROS  negative genitourinary   Musculoskeletal negative musculoskeletal ROS (+)   Abdominal   Peds negative pediatric ROS (+)  Hematology negative hematology ROS (+)   Anesthesia Other Findings   Reproductive/Obstetrics negative OB ROS                            Anesthesia Physical Anesthesia Plan  ASA: II  Anesthesia Plan: General   Post-op Pain Management:    Induction: Intravenous, Rapid sequence and Cricoid pressure planned  PONV Risk Score and Plan: 1 and Treatment may vary due to age or medical condition, Midazolam, Ondansetron, Dexamethasone and Scopolamine patch - Pre-op  Airway Management Planned: Nasal Cannula  Additional Equipment: Arterial line  Intra-op Plan:   Post-operative Plan: Extubation in OR  Informed Consent: I have reviewed the patients History and Physical, chart, labs and discussed the procedure including the risks, benefits and alternatives for the proposed anesthesia with the patient or authorized representative who has indicated his/her understanding and acceptance.     Dental advisory given  Plan Discussed with: CRNA and Anesthesiologist  Anesthesia Plan Comments: (Patient  very nauseated, no productive, scant foamy secretions come up. Will give zofran. Dr.[name] has ordered decadron. Plan for RSI/ETT. May need to do arterial line intraop due to patient's inability to comply due to severe nausea. Norton Blizzard, MD  )      Anesthesia Quick Evaluation

## 2021-01-09 NOTE — Transfer of Care (Signed)
Immediate Anesthesia Transfer of Care Note  Patient: Troy Leon  Procedure(s) Performed: CRANIOTOMY FOR TUMOR EXCISION (N/A Head) APPLICATION OF CRANIAL NAVIGATION (N/A )  Patient Location: PACU  Anesthesia Type:General  Level of Consciousness: awake, alert , oriented and patient cooperative  Airway & Oxygen Therapy: Patient Spontanous Breathing and Patient connected to face mask oxygen  Post-op Assessment: Report given to RN and Post -op Vital signs reviewed and stable  Post vital signs: Reviewed and stable  Last Vitals:  Vitals Value Taken Time  BP 116/68 01/09/21 1745  Temp 36.5 C 01/09/21 1745  Pulse 67 01/09/21 1750  Resp 27 01/09/21 1750  SpO2 100 % 01/09/21 1750  Vitals shown include unvalidated device data.  Last Pain:  Vitals:   01/09/21 0943  TempSrc:   PainSc: 0-No pain         Complications: No complications documented.

## 2021-01-09 NOTE — Anesthesia Procedure Notes (Signed)
Arterial Line Insertion Start/End3/11/2020 11:45 AM, 01/09/2021 11:50 AM Performed by: Merlinda Frederick, MD, Mariea Clonts, CRNA, CRNA  Preanesthetic checklist: patient identified Left, radial was placed Catheter size: 20 G Hand hygiene performed   Attempts: 1 Procedure performed without using ultrasound guided technique. Following insertion, dressing applied and Biopatch. Post procedure assessment: normal  Patient tolerated the procedure well with no immediate complications.

## 2021-01-09 NOTE — Op Note (Signed)
Providing Compassionate, Quality Care - Together  Date of service: 01/09/2021  PREOP DIAGNOSIS:  Large right frontal intraaxial tumor with midline shift   POSTOP DIAGNOSIS: Same  PROCEDURE: Stereotactic right frontal craniotomy for resection of tumor, via right frontal lobectomy Use of stereotaxy Use of microscope for micodissection   SURGEON: Dr. Pieter Partridge C. Tyrene Nader, DO  ASSISTANT: Dr. Erline Levine, MD  ANESTHESIA: General Endotracheal  EBL: 400cc  SPECIMENS: Right frontal tumor  DRAINS: None  COMPLICATIONS: None  CONDITION: Stable  HISTORY: Troy Leon is a 50 y.o. male that presented to the outpatient setting with complaints of severe headaches ongoing for a few months, personality changes over the past year.  His primary care physician ordered an MRI of the brain and a very large right frontal heterogeneously enhancing mass was discovered therefore the patient was urgently referred to my partner Dr. Vertell Limber.  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  His main complaint were headaches as well as difficulty with coordination, mainly he felt as though his signals were not getting to his extremities from his brain.  He was placed on Decadron in the interim of surgical scheduling which did help his headaches.  We discussed surgical resection of his tumor via a right frontal lobectomy given the large tumor, and we agreed on all risks, benefits.  We discussed expected outcomes and likelihood that this is either an intermediate or high-grade glioma.  This was discussed at length with myself, the patient's wife and Dr. Vertell Limber in the outpatient setting.  PROCEDURE IN DETAIL: The patient was brought to the operating room. After induction of general anesthesia, the patient was positioned on the operative table in the supine position, his head was affixed in the [place]  head holder. All pressure points were meticulously padded.  The right frontal and left superior frontal regions were clipped free of hair.  Neuro navigation was registered to the patient and verified to have excellent accuracy.  A right three-quarter bicoronal skin incision was then marked out and prepped and draped in the usual sterile fashion.  Using a 10 blade, skin incision was made down to the cranium.  Raney clips were placed along the skin edges.  A suprafascial dissection was performed over the temporalis and the skin incision was continued down to the zygoma.  Again Raney clips were placed along the skin edges.  The skin flap was reflected forward with periosteal elevator.  An interfascial dissection was performed just posterior to the keyhole along the temporalis fascia to reflect the temporalis fascia forward.  Using Bovie cautery, a cuff of temporalis was left superiorly along the superior temporal line.  Self-retaining retractors were placed which consisted of fishhooks.  The craniotomy was planned using neuro navigation to be just anterior to the coronal suture which was directly visualized.  Craniotomy was performed with a high-speed drill and elevated in normal fashion.  This was stored in antibiotic solution for the remainder of the case.  Epidural hemostasis was achieved with bipolar cautery, Surgi-Flo.  Multiple tack up sutures were placed around the posterior lateral and anterior regions of the craniotomy.  The microscope at this time was sterilely draped and brought into the field.  The remainder of the surgery was performed under microscopic visualization for microdissection.  A C- shaped durotomy was performed with a 15 blade and Metzenbaum scissors with the base along the  superior sagittal sinus.  There was a large clearly vascular and abnormal region of cortex that was red and purple in many gyri along the superior and middle frontal gyrus.  The posterior corticotomy was performed along  the durotomy edge approximately 1 cm in front of the coronal suture with bipolar cautery and an 11 blade.  Using bipolar forceps and suction white matter dissection was performed to the depth of the posterior tumor using neuro navigation.  Of note there were some small thrombosed veins in this region that were removed.  I then turned my attention to a subfrontal approach disconnecting any venous connections to the dura with bipolar cautery and microscissors.  The frontal lobe was elevated to the level of the olfactory nerve and this place was kept with a cottonoid.  I then turned my attention to the interhemispheric dissection which was performed from posterior to anterior following the anterior cerebral arteries to the A2-3 junction.  Of note was a sizable amount of subfalcine herniation of the right frontal lobe that was gently retracted back to midline.  At this point, the posterior white matter dissection was then carried approximately 45 degrees anteriorly to then connect this dissection to the anterior skull base to the dissection above the olfactory nerve and the lateral sphenoid wing.  Upon performing this, the tumor was encountered and highly vascular.  The tumor was then removed in multiple pieces using bipolar cautery and microscissors.  This was circumferentially taken to the lateral edge of the sphenoid wing anteriorly.  This was also performed along the medial frontal lobe including a portion of the cingulate gyrus as this appeared to have tumor involvement.  At this point majority of the tumor has now been removed and the brain was relaxed and pulsatile. The tumor was sent for permanent pathology. Using navigation and bipolar, suction technique all the borders of the dissection cavity were inspected and felt to involve normal white matter and no further tumor borders were noted.  Hemostasis was achieved with bipolar cautery and Surgi-Flo.  Copious amounts of irrigation were used and the resection  cavity was noted to be excellently hemostatic.  The resection cavity was then lined with pieces of Surgicel. There was no obvious entrance into the right frontal ventricle.  Dura was then closed with 4-0 Nurolon in a running and interrupted fashion.  Epidural hemostasis again was achieved with Surgi-Flo and cottonoids.  A small piece of DuraGen was placed along the durotomy edge and adherence glue was placed.  A dry noncompressed piece of Gelfoam was placed in the craniotomy site and the bone flap was plated and replaced using the cranial plating system.  The fascia along the temporalis was then approximated with 0 Vicryl sutures.  Self-retaining retractors were removed.  Raney clips were removed. The galea was then closed with 2-0 Vicryl sutures.  The skin was closed with staples.  Sterile dressing was applied.  At the end of the case all sponge, needle, and instrument counts were correct. The patient was then transferred to the stretcher, head holder was removed, extubated, and taken to the post-anesthesia care unit in stable hemodynamic condition.

## 2021-01-10 ENCOUNTER — Encounter (HOSPITAL_COMMUNITY): Payer: Self-pay | Admitting: Neurological Surgery

## 2021-01-10 ENCOUNTER — Inpatient Hospital Stay (HOSPITAL_COMMUNITY): Payer: Medicaid Other

## 2021-01-10 DIAGNOSIS — J3489 Other specified disorders of nose and nasal sinuses: Secondary | ICD-10-CM | POA: Diagnosis not present

## 2021-01-10 DIAGNOSIS — C719 Malignant neoplasm of brain, unspecified: Secondary | ICD-10-CM | POA: Diagnosis not present

## 2021-01-10 DIAGNOSIS — G9389 Other specified disorders of brain: Secondary | ICD-10-CM | POA: Diagnosis not present

## 2021-01-10 DIAGNOSIS — J322 Chronic ethmoidal sinusitis: Secondary | ICD-10-CM | POA: Diagnosis not present

## 2021-01-10 LAB — POCT I-STAT 7, (LYTES, BLD GAS, ICA,H+H)
Acid-Base Excess: 0 mmol/L (ref 0.0–2.0)
Bicarbonate: 25.4 mmol/L (ref 20.0–28.0)
Calcium, Ion: 1.13 mmol/L — ABNORMAL LOW (ref 1.15–1.40)
HCT: 36 % — ABNORMAL LOW (ref 39.0–52.0)
Hemoglobin: 12.2 g/dL — ABNORMAL LOW (ref 13.0–17.0)
O2 Saturation: 100 %
Potassium: 4.6 mmol/L (ref 3.5–5.1)
Sodium: 140 mmol/L (ref 135–145)
TCO2: 27 mmol/L (ref 22–32)
pCO2 arterial: 42.4 mmHg (ref 32.0–48.0)
pH, Arterial: 7.386 (ref 7.350–7.450)
pO2, Arterial: 275 mmHg — ABNORMAL HIGH (ref 83.0–108.0)

## 2021-01-10 LAB — CBC
HCT: 37.9 % — ABNORMAL LOW (ref 39.0–52.0)
Hemoglobin: 13.3 g/dL (ref 13.0–17.0)
MCH: 32.8 pg (ref 26.0–34.0)
MCHC: 35.1 g/dL (ref 30.0–36.0)
MCV: 93.3 fL (ref 80.0–100.0)
Platelets: 167 10*3/uL (ref 150–400)
RBC: 4.06 MIL/uL — ABNORMAL LOW (ref 4.22–5.81)
RDW: 12.4 % (ref 11.5–15.5)
WBC: 16.9 10*3/uL — ABNORMAL HIGH (ref 4.0–10.5)
nRBC: 0 % (ref 0.0–0.2)

## 2021-01-10 LAB — BASIC METABOLIC PANEL
Anion gap: 8 (ref 5–15)
BUN: 15 mg/dL (ref 6–20)
CO2: 26 mmol/L (ref 22–32)
Calcium: 8.5 mg/dL — ABNORMAL LOW (ref 8.9–10.3)
Chloride: 102 mmol/L (ref 98–111)
Creatinine, Ser: 0.83 mg/dL (ref 0.61–1.24)
GFR, Estimated: >60 mL/min (ref >60)
Glucose, Bld: 104 mg/dL — ABNORMAL HIGH (ref 70–99)
Potassium: 4.2 mmol/L (ref 3.5–5.1)
Sodium: 136 mmol/L (ref 135–145)

## 2021-01-10 IMAGING — MR MR HEAD WO/W CM
4 of 14 series · 12 of 48 positions shown · IV contrast (gadavist)
Comparison: Prior brain MRI examinations [DATE] and earlier.

CLINICAL DATA: Brain/CNS neoplasm, staging. Status post craniotomy.

EXAM:
MRI HEAD WITHOUT AND WITH CONTRAST
TECHNIQUE: Multiplanar, multiecho pulse sequences of the brain and surrounding
structures were obtained without and with intravenous contrast.
CONTRAST:  7mL GADAVIST GADOBUTROL 1 MMOL/ML IV SOLN

[Series 2: DWI · axial · 3.0mm · 0.94mm/px · z∈[-95,+65]mm · 6 of 110 slices shown]
[im 1/110]
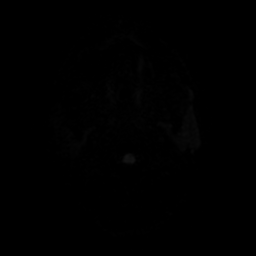
[im 22/110]
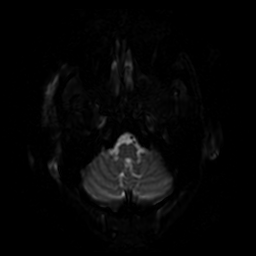
[im 44/110]
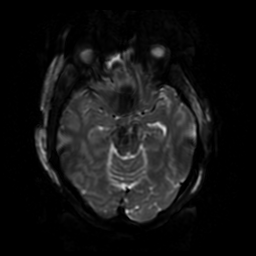
[im 66/110]
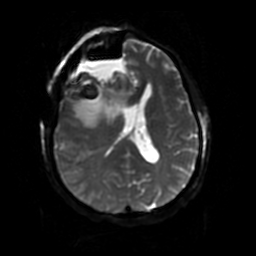
[im 88/110]
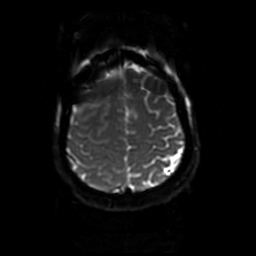
[im 110/110]
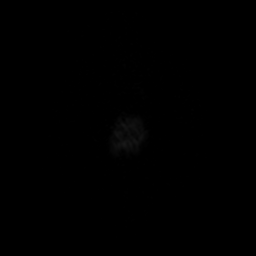

[Series 4: FLAIR · sagittal · 5.0mm · 0.23mm/px · 2 of 25 slices shown (1 of 2)]
[im 1/25]
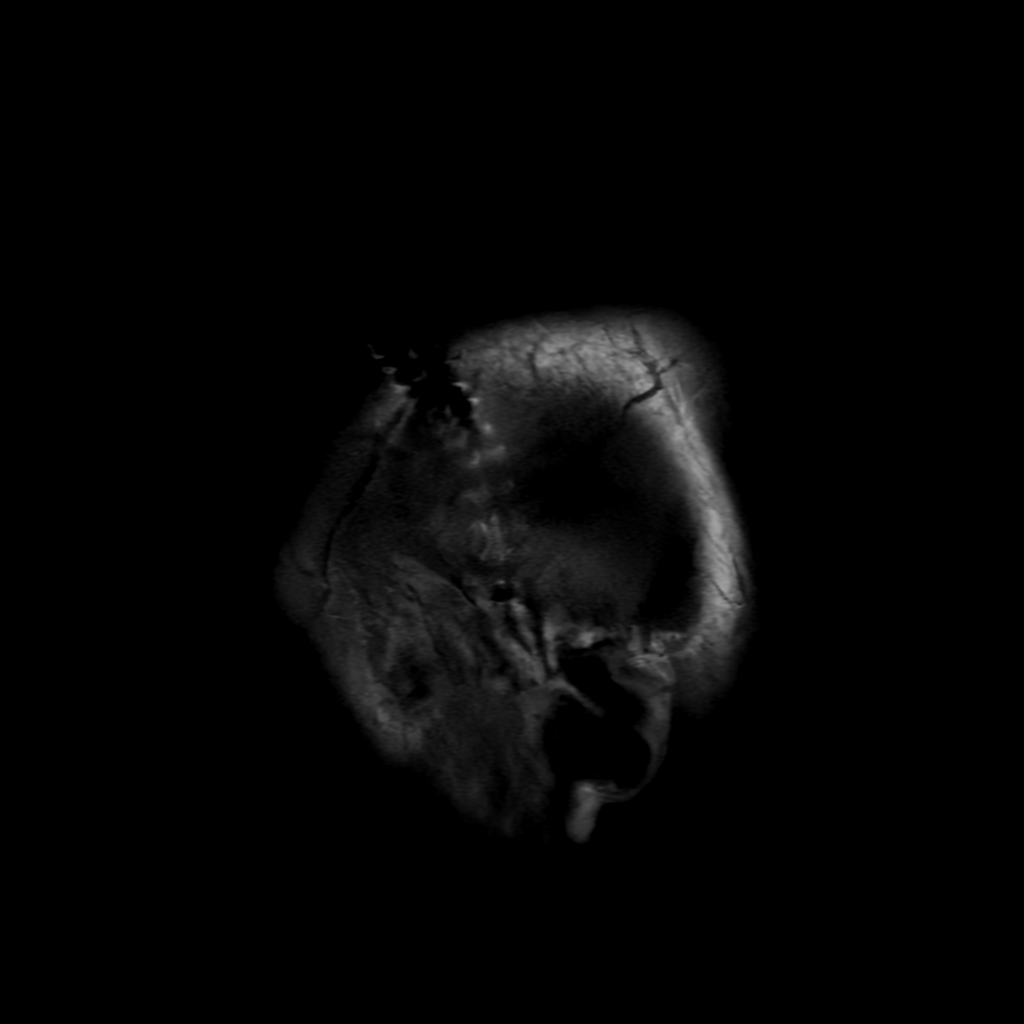
[im 25/25]
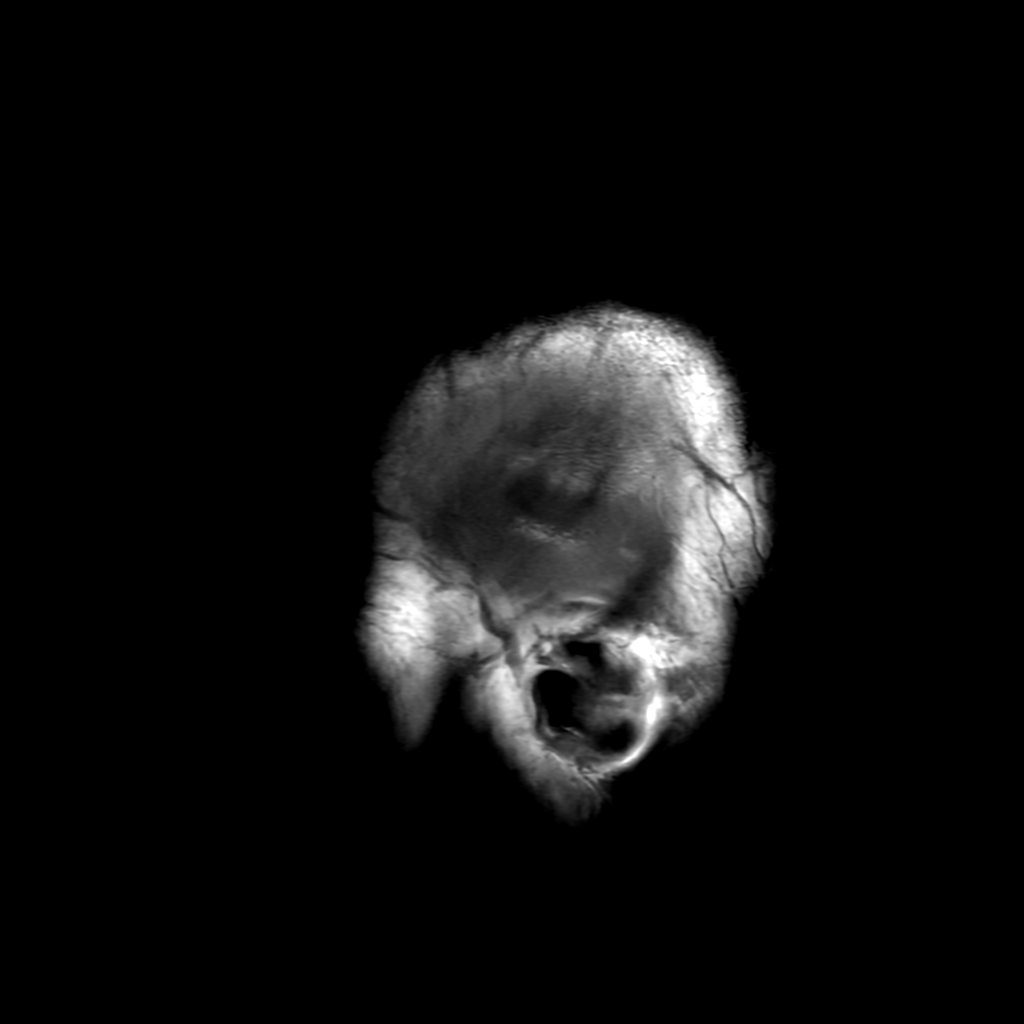

[Series 6: FLAIR · axial · 3.0mm · 0.45mm/px · z∈[-95,+66]mm · 2 of 28 slices shown (2 of 2)]
[im 1/28]
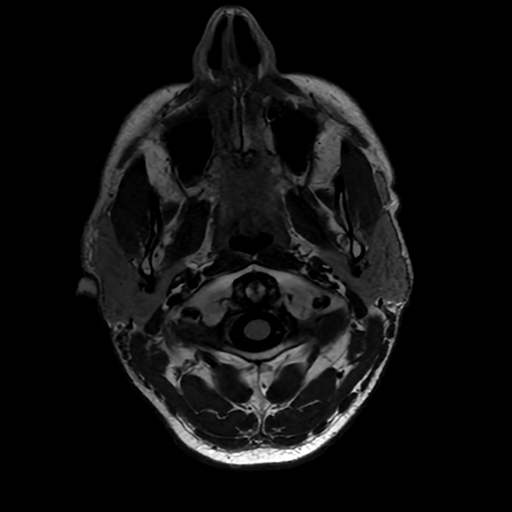
[im 28/28]
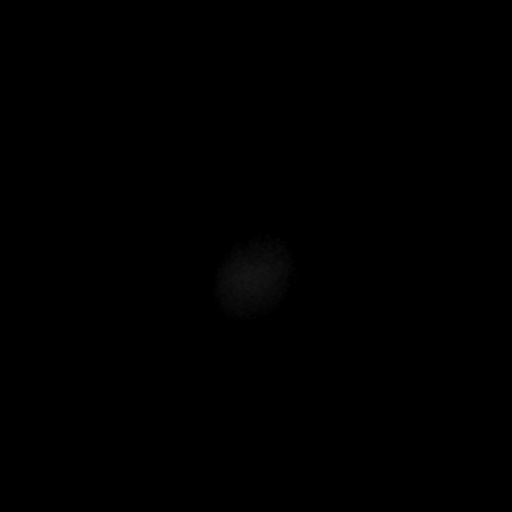

[Series 12: FLAIR post-contrast · sagittal · 5.0mm · 0.23mm/px · 2 of 25 slices shown]
[im 1/25]
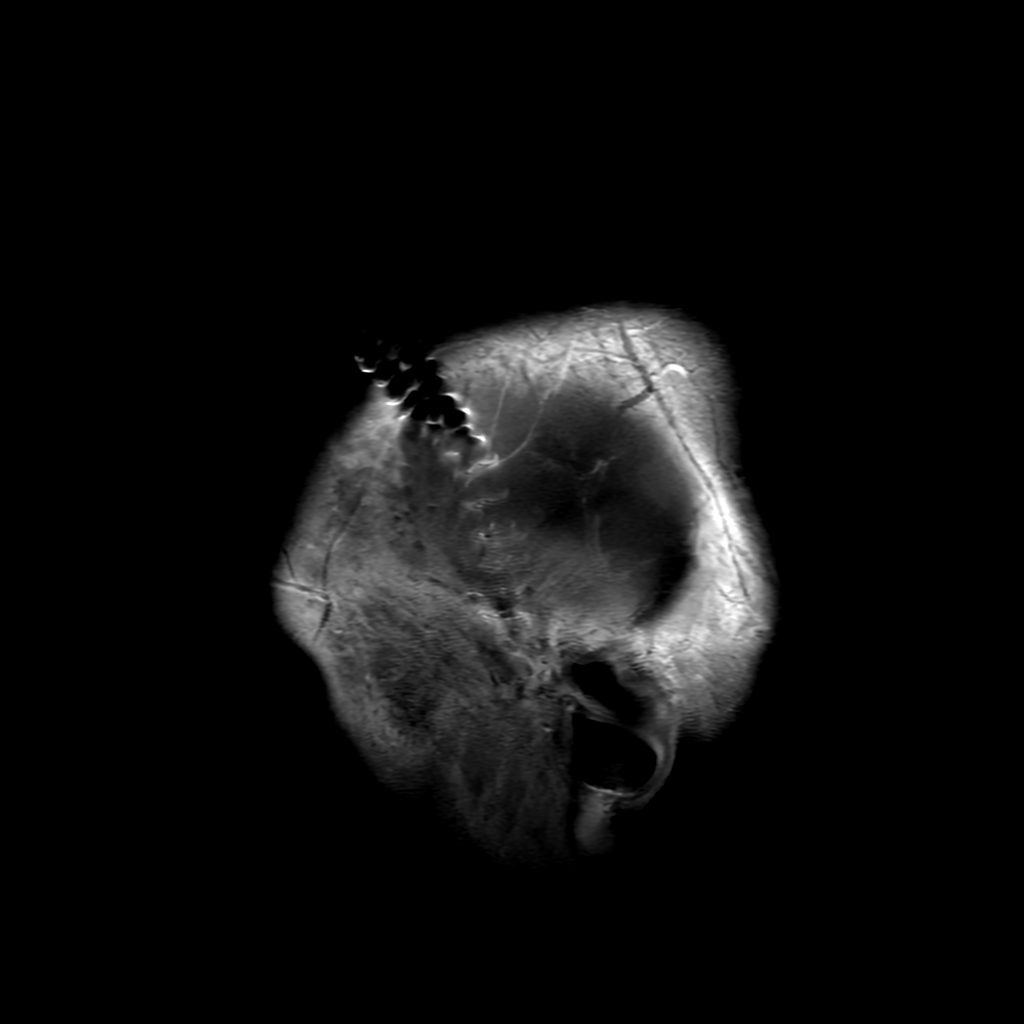
[im 25/25]
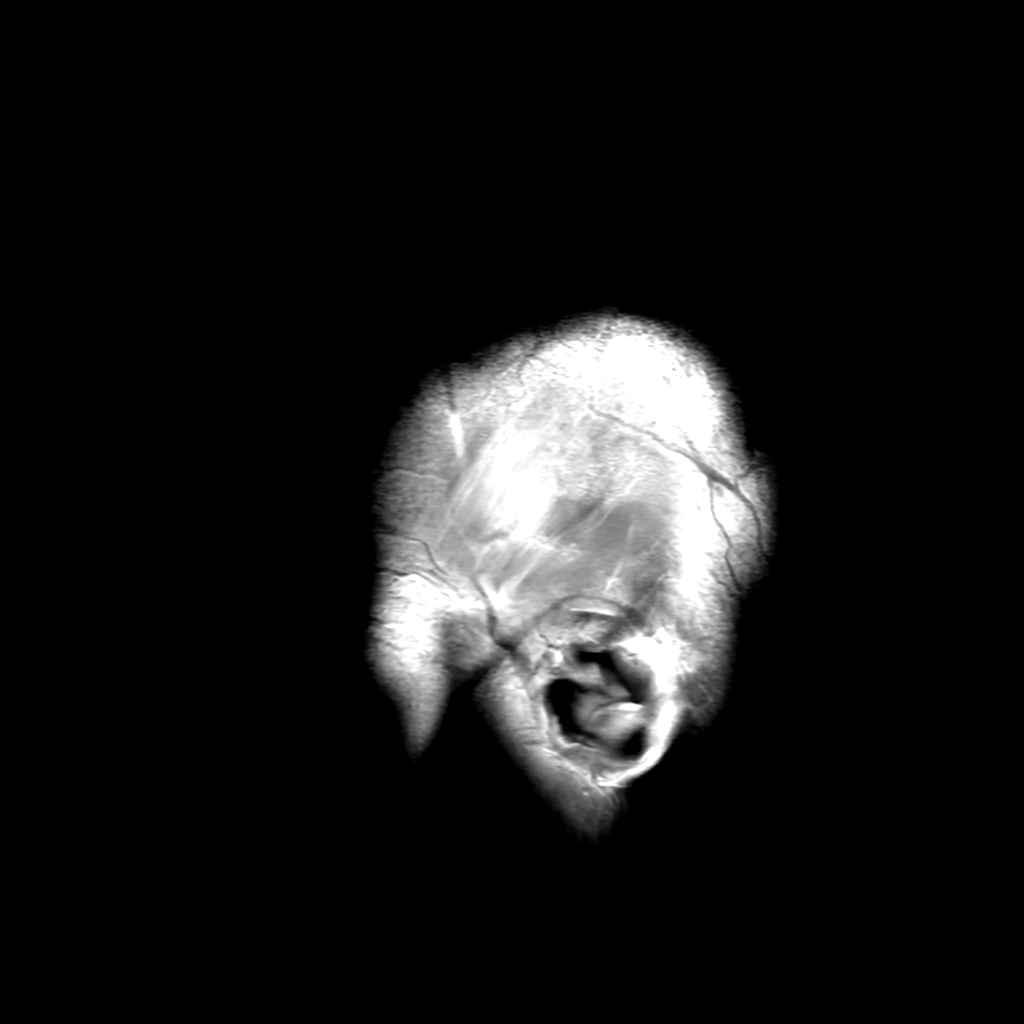

[12 of 48 positions shown; findings below may reference images not displayed]

FINDINGS: Brain:

Postoperative changes from interval resection of a large anterior
right frontal lobe mass. Pneumocephalus and proteinaceous
fluid/blood products within the resection cavity. Precontrast T1
hyperintense blood products along the margins of the resection
cavity without definite abnormal enhancement. Small volume
extra-axial proteinaceous fluid/blood products also overlying the
right cerebral convexity.

Similar appearance of T2/FLAIR hyperintense signal abnormality
within the adjacent white matter and within the anterior corpus
callosum, crossing midline.

Persistent mass effect with partial effacement of the right lateral
ventricle. Persistent although decreased leftward midline shift, now
measuring 1.2 cm (previously 1.8 cm).

Vascular: Expected proximal arterial flow voids.

Skull and upper cervical spine: Right frontal cranioplasty. No focal
suspicious marrow lesion.

Sinuses/Orbits: Visualized orbits show no acute finding. Mild
ethmoid sinus mucosal thickening.
IMPRESSION: Postoperative changes from interval resection of a large anterior
right frontal lobe mass. Pneumocephalus and proteinaceous
fluid/blood products within the resection cavity. T1 hyperintense
blood products along the margins of the resection cavity without
definite abnormal enhancement.

Similar appearance of T2/FLAIR hyperintense signal abnormality
within the adjacent white matter and within the anterior corpus
callosum, crossing midline. Findings are compatible with residual
edema and possibly non-enhancing infiltrative tumor. Correlate with
pending pathology.

Additional small-volume extra-axial proteinaceous fluid/blood
products overlying the right cerebral convexity.

Persistent although decreased mass effect as described.

This MRI will serve as a baseline for future follow-up examinations.

## 2021-01-10 MED ORDER — WHITE PETROLATUM EX OINT
TOPICAL_OINTMENT | CUTANEOUS | Status: AC
  Filled 2021-01-10: qty 28.35

## 2021-01-10 MED ORDER — CALCIUM CARBONATE ANTACID 500 MG PO CHEW
1.0000 | CHEWABLE_TABLET | Freq: Three times a day (TID) | ORAL | Status: DC | PRN
  Administered 2021-01-10 – 2021-01-11 (×4): 200 mg via ORAL
  Filled 2021-01-10 (×5): qty 1

## 2021-01-10 MED ORDER — PANTOPRAZOLE SODIUM 40 MG PO TBEC
40.0000 mg | DELAYED_RELEASE_TABLET | Freq: Every day | ORAL | Status: DC
  Administered 2021-01-10: 40 mg via ORAL
  Filled 2021-01-10: qty 1

## 2021-01-10 MED ORDER — NICOTINE 14 MG/24HR TD PT24
14.0000 mg | MEDICATED_PATCH | Freq: Every day | TRANSDERMAL | Status: DC
  Administered 2021-01-10 – 2021-01-11 (×2): 14 mg via TRANSDERMAL
  Filled 2021-01-10 (×2): qty 1

## 2021-01-10 MED ORDER — GADOBUTROL 1 MMOL/ML IV SOLN
7.0000 mL | Freq: Once | INTRAVENOUS | Status: AC | PRN
  Administered 2021-01-10: 7 mL via INTRAVENOUS

## 2021-01-10 MED FILL — Thrombin (Recombinant) For Soln 5000 Unit: CUTANEOUS | Qty: 5000 | Status: AC

## 2021-01-10 MED FILL — Thrombin For Soln 20000 Unit: CUTANEOUS | Qty: 1 | Status: AC

## 2021-01-10 NOTE — Progress Notes (Signed)
Patient admitted to unit via 4N-ICU. Patient is A&Ox4, vitals WNL and not c/o pain but just soreness to head. Assessment completed. Patient oriented to room, call bell within reach and bed alarm on.   Patient belongings include: Blanket, bag with snacks and clothing, charger and phone at patient side.

## 2021-01-10 NOTE — Progress Notes (Signed)
Subjective: Patient reports moderate incsional pain. No other complaints or acute events overnight.   Objective: Vital signs in last 24 hours: Temp:  [97.7 F (36.5 C)-98.8 F (37.1 C)] 98.8 F (37.1 C) (03/02 0800) Pulse Rate:  [42-72] 42 (03/02 0700) Resp:  [11-19] 13 (03/02 0700) BP: (108-124)/(64-82) 109/73 (03/02 0700) SpO2:  [97 %-100 %] 98 % (03/02 0700) Arterial Line BP: (78-142)/(60-93) 85/65 (03/02 0600)  Intake/Output from previous day: 03/01 0701 - 03/02 0700 In: 4001.4 [I.V.:3351.4; IV Piggyback:650] Out: 3800 [Urine:3400; Blood:400] Intake/Output this shift: No intake/output data recorded.  Physical Exam: Patient is awake, A/O X 4, conversant, and in good spirits. Speech is fluent and appropriate. Doing well. MAEW with good strength that is symmetric bilaterally. 5/5 BUE/BLE. Crani site is CDI and well approximated with no drainage or erythema. CNs grossly intact.  Lab Results: Recent Labs    01/09/21 1800 01/10/21 0653  WBC 14.4* 16.9*  HGB 12.6* 13.3  HCT 36.0* 37.9*  PLT 167 167   BMET Recent Labs    01/09/21 1630 01/09/21 1800 01/10/21 0653  NA 140  --  136  K 4.6  --  4.2  CL  --   --  102  CO2  --   --  26  GLUCOSE  --   --  104*  BUN  --   --  15  CREATININE  --  1.00 0.83  CALCIUM  --   --  8.5*    Studies/Results: No results found.  Assessment/Plan: Patient is post-op day 1 s/p right frontal craniotomy for resection of tumor. He is doing well. No complaints of headache or N/V.  His only complaint is mild incisional discomfort. Continue working on pain control, mobility and ambulating patient. Will plan for discharge tomorrow.  LOS: 1 day     Marvis Moeller, DNP, NP-C 01/10/2021, 9:10 AM

## 2021-01-10 NOTE — Anesthesia Postprocedure Evaluation (Signed)
Anesthesia Post Note  Patient: Troy Leon  Procedure(s) Performed: CRANIOTOMY FOR TUMOR EXCISION (N/A Head) APPLICATION OF CRANIAL NAVIGATION (N/A )     Patient location during evaluation: PACU Anesthesia Type: General Level of consciousness: awake and alert Pain management: pain level controlled Vital Signs Assessment: post-procedure vital signs reviewed and stable Respiratory status: spontaneous breathing, nonlabored ventilation, respiratory function stable and patient connected to nasal cannula oxygen Cardiovascular status: blood pressure returned to baseline and stable Postop Assessment: no apparent nausea or vomiting Anesthetic complications: no   No complications documented.  Last Vitals:  Vitals:   01/10/21 0900 01/10/21 1000  BP:  102/68  Pulse: (!) 36 (!) 43  Resp: 14 12  Temp:    SpO2: 99% 99%    Last Pain:  Vitals:   01/10/21 0800  TempSrc: Oral  PainSc:                  Tiajuana Amass

## 2021-01-11 MED ORDER — LEVETIRACETAM 500 MG PO TABS: 500.0000 mg | ORAL_TABLET | Freq: Two times a day (BID) | ORAL | 3 refills | Status: DC

## 2021-01-11 MED ORDER — OMEPRAZOLE 20 MG PO CPDR: 20.0000 mg | DELAYED_RELEASE_CAPSULE | Freq: Two times a day (BID) | ORAL | 3 refills | Status: DC

## 2021-01-11 MED ORDER — DEXAMETHASONE 4 MG PO TABS: 4.0000 mg | ORAL_TABLET | Freq: Three times a day (TID) | ORAL | 3 refills | Status: DC

## 2021-01-11 NOTE — Progress Notes (Addendum)
Subjective: Patient reports mild incisional pain and pain around his right eye. Otherwise he has no complaints and is in NAD. No acute events overnight.   Objective: Vital signs in last 24 hours: Temp:  [98 F (36.7 C)-99.4 F (37.4 C)] 98.5 F (36.9 C) (03/03 0728) Pulse Rate:  [36-51] 51 (03/03 0728) Resp:  [12-22] 17 (03/03 0728) BP: (102-116)/(62-74) 115/70 (03/03 0728) SpO2:  [96 %-100 %] 99 % (03/03 0728)  Intake/Output from previous day: 03/02 0701 - 03/03 0700 In: 1188.6 [I.V.:1088.6; IV Piggyback:100] Out: 1200 [Urine:1200] Intake/Output this shift: No intake/output data recorded.  Physical Exam: Patient is awake, A/O X 4, conversant, and in good spirits. Speech is fluent and appropriate. Doing well. MAEW with good strength that is symmetric bilaterally. 5/5 BUE/BLE. Crani site is CDI and well approximated with no drainage or erythema. CNs grossly intact.   Lab Results: Recent Labs    01/09/21 1800 01/10/21 0653  WBC 14.4* 16.9*  HGB 12.6* 13.3  HCT 36.0* 37.9*  PLT 167 167   BMET Recent Labs    01/09/21 1630 01/09/21 1800 01/10/21 0653  NA 140  --  136  K 4.6  --  4.2  CL  --   --  102  CO2  --   --  26  GLUCOSE  --   --  104*  BUN  --   --  15  CREATININE  --  1.00 0.83  CALCIUM  --   --  8.5*    Studies/Results: MR BRAIN W WO CONTRAST  Result Date: 01/10/2021 CLINICAL DATA:  Brain/CNS neoplasm, staging. Status post craniotomy. EXAM: MRI HEAD WITHOUT AND WITH CONTRAST TECHNIQUE: Multiplanar, multiecho pulse sequences of the brain and surrounding structures were obtained without and with intravenous contrast. CONTRAST:  6mL GADAVIST GADOBUTROL 1 MMOL/ML IV SOLN COMPARISON:  Prior brain MRI examinations 01/07/2021 and earlier. FINDINGS: Brain: Postoperative changes from interval resection of a large anterior right frontal lobe mass. Pneumocephalus and proteinaceous fluid/blood products within the resection cavity. Precontrast T1 hyperintense blood products  along the margins of the resection cavity without definite abnormal enhancement. Small volume extra-axial proteinaceous fluid/blood products also overlying the right cerebral convexity. Similar appearance of T2/FLAIR hyperintense signal abnormality within the adjacent white matter and within the anterior corpus callosum, crossing midline. Persistent mass effect with partial effacement of the right lateral ventricle. Persistent although decreased leftward midline shift, now measuring 1.2 cm (previously 1.8 cm). Vascular: Expected proximal arterial flow voids. Skull and upper cervical spine: Right frontal cranioplasty. No focal suspicious marrow lesion. Sinuses/Orbits: Visualized orbits show no acute finding. Mild ethmoid sinus mucosal thickening. IMPRESSION: Postoperative changes from interval resection of a large anterior right frontal lobe mass. Pneumocephalus and proteinaceous fluid/blood products within the resection cavity. T1 hyperintense blood products along the margins of the resection cavity without definite abnormal enhancement. Similar appearance of T2/FLAIR hyperintense signal abnormality within the adjacent white matter and within the anterior corpus callosum, crossing midline. Findings are compatible with residual edema and possibly non-enhancing infiltrative tumor. Correlate with pending pathology. Additional small-volume extra-axial proteinaceous fluid/blood products overlying the right cerebral convexity. Persistent although decreased mass effect as described. This MRI will serve as a baseline for future follow-up examinations. Electronically Signed   By: Kellie Simmering DO   On: 01/10/2021 20:23    Assessment/Plan: Patient is post-op day 2 s/p right frontal craniotomy for resection of tumor. He is recovering well and reports a resolution of his preoperative symptoms.  His only complaint is mild  incisional discomfort and right eye discomfort. Continue working on pain control, mobility and ambulating  patient. Will plan for discharge today. Patient will be discharged with dexamethasone, Keppra, and Prilosec.  He will follow-up in the office as an outpatient in 10 days for staple removal.   LOS: 2 days     Marvis Moeller, DNP, NP-C 01/11/2021, 8:15 AM

## 2021-01-11 NOTE — Progress Notes (Signed)
Pt and Pt's wife given D/C education and all questions answered. No printed prescriptions to give or equipment to deliver. Pt and wife asked about pain medication. Pt did not want to wait for meds to be updated here so RN reached out to Neurosurgery NP and pain meds will be send to Pt's pharmacy from their office. IV's removed. Pt taken to car with all belongings.

## 2021-01-11 NOTE — Progress Notes (Signed)
Patient refused to take any P.O. prn pain medication. Patient stated that when swallowing pills whole medication makes him nauseous and vomits and when crushed up in applesauce the taste is bad. Only willing to take IV morphine to relieve pain.   Nurse explained to patient that pill form medication will last longer than IV medication but patient is still wanting just IV medication.

## 2021-01-11 NOTE — Discharge Summary (Signed)
Physician Discharge Summary  Patient ID: Troy Leon MRN: [id number] DOB/AGE: 1970-12-12 50 y.o.  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 The patient tolerated the procedure well and was taken to the recovery room and then to the floor in stable condition. The hospital course was routine. There were no complications. The wound remained clean dry and intact. Pt had appropriate incisional soreness. No complaints of headache, N/V, or any neurological deficits. The patient remained afebrile with stable vital signs, and tolerated a regular diet. The patient continued to increase activities, and pain was well controlled with oral pain medications.  Consults: None  Significant Diagnostic Studies: radiology: MRI   Treatments: surgery: right frontal craniotomy   Discharge Exam: Blood pressure 114/81, pulse (!) 51, temperature 98.5 F (36.9 C), temperature source Oral, resp. rate 18, height 5\' 7"  (1.702 m), weight 68 kg, SpO2 100 %.  Physical Exam: Patient is awake, A/O X 4, conversant, and in good spirits. Speech is fluent and appropriate. Doing well. MAEW with good strength that is symmetric bilaterally. 5/5 BUE/BLE. Crani site is CDI and well approximated with no drainage or erythema. CNs grossly intact.  Disposition: Discharge disposition: 01-Home or Self Care        Allergies as of 01/11/2021      Reactions   Penicillins Other (See Comments)   Has patient had a PCN reaction causing immediate rash, facial/tongue/throat swelling, SOB or lightheadedness with hypotension:  unknown Has patient had a PCN reaction causing severe rash involving mucus membranes or skin necrosis: unknown Has patient had a PCN reaction that required hospitalization unknown Has patient had a PCN reaction occurring within the last 10 years:no If all of the above answers are "NO", then may proceed wit      Medication List    TAKE these medications   dexamethasone 4 MG tablet Commonly known as: DECADRON Take 1 tablet (4 mg total) by mouth 3 (three) times daily.   levETIRAcetam 500 MG tablet Commonly known as: Keppra Take 1 tablet (500 mg total) by mouth 2 (two) times daily.   omeprazole 20 MG capsule Commonly known as: PriLOSEC Take 1 capsule (20 mg total) by mouth 2 (two) times daily.        Signed: Marvis Moeller, DNP, NP-C 01/11/2021, 2:23 PM

## 2021-01-12 ENCOUNTER — Telehealth: Payer: Self-pay | Admitting: *Deleted

## 2021-01-12 NOTE — Telephone Encounter (Signed)
Transition Care Management Follow-up Telephone Call  Date of discharge and from where: 01/11/2021 - [facility]  How have you been since you were released from the hospital? "Doing well"  Any questions or concerns? No  Items Reviewed:  Did the pt receive and understand the discharge instructions provided? Yes   Medications obtained and verified? Yes   Other? No   Any new allergies since your discharge? No   Dietary orders reviewed? Yes  Do you have support at home? Yes   Home Care and Equipment/Supplies: Were home health services ordered? not applicable If so, what is the name of the agency? N/A  Has the agency set up a time to come to the patient's home? not applicable Were any new equipment or medical supplies ordered?  No What is the name of the medical supply agency? N/A Were you able to get the supplies/equipment? not applicable Do you have any questions related to the use of the equipment or supplies? No  Functional Questionnaire: (I = Independent and D = Dependent) ADLs: I  Bathing/Dressing- I  Meal Prep- I  Eating- I  Maintaining continence- I  Transferring/Ambulation- I  Managing Meds- I  Follow up appointments reviewed:   PCP Hospital f/u appt confirmed? Yes  Scheduled to see Dr. Wolfgang Phoenix on 02/07/2021 @ 1030.  [facility] f/u appt confirmed? No    Are transportation arrangements needed? No   If their condition worsens, is the pt aware to call PCP or go to the Emergency Dept.? Yes  Was the patient provided with contact information for the PCP's office or ED? Yes  Was to pt encouraged to call back with questions or concerns? Yes

## 2021-01-19 ENCOUNTER — Other Ambulatory Visit: Payer: Self-pay | Admitting: Radiation Therapy

## 2021-01-26 ENCOUNTER — Inpatient Hospital Stay: Payer: Medicaid Other | Attending: Internal Medicine | Admitting: Internal Medicine

## 2021-01-26 ENCOUNTER — Other Ambulatory Visit: Payer: Self-pay | Admitting: Radiation Therapy

## 2021-01-26 ENCOUNTER — Other Ambulatory Visit: Payer: Self-pay

## 2021-01-26 VITALS — BP 98/60 | HR 56 | Temp 98.1°F | Resp 18 | Ht 67.0 in | Wt 151.1 lb

## 2021-01-26 DIAGNOSIS — C711 Malignant neoplasm of frontal lobe: Secondary | ICD-10-CM | POA: Insufficient documentation

## 2021-01-26 DIAGNOSIS — G939 Disorder of brain, unspecified: Secondary | ICD-10-CM

## 2021-01-26 NOTE — Progress Notes (Signed)
[facility] [place], [place] [address] [phone]   New Patient Evaluation  Date of Service: 01/26/21 Patient Name: Troy Leon Patient MRN: [id number] Patient DOB: 10-09-1971 Provider: Ventura Sellers, MD  Identifying Statement:  Troy Leon is a 50 y.o. male with right frontal brain mass who presents for initial consultation and evaluation.    Referring Provider: Karsten Ro, [address] [place],  [place] [address]  Oncologic History: 01/09/21: Right frontal craniotomy, resection by Dr. Reatha Armour; path pending  Biomarkers:  MGMT Unknown.  IDH 1/2 Unknown.  EGFR Unknown  TERT Unknown   History of Present Illness: The patient's records from the referring physician were obtained and reviewed and the patient interviewed to confirm this HPI.  Troy Leon presented to medical attention in February 2022 with several weeks history of new onset headaches.  He describes holo-cranial pain, severe in nature, often associated with AM time period.  In addition, he describes multiple episodes of "sudden dropping, legs getting out, feeling like I'm losing contact", followed by recovery associated with a period of fatigue.  Notably, these episodes stopped following surgery/resection on 01/09/21 ([name]) and initiation of anti-seizure medication.  Currently he feels well, aside from frequent burping and spasming of "esophagus".  Taking $Remov'4mg'gPJwJx$  of decadron three times per day since discharge.  No functional deficits otherwise.  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  Medications: Current Outpatient Medications on File Prior to Visit  Medication Sig Dispense Refill  . dexamethasone (DECADRON) 4 MG tablet Take 1 tablet (4 mg total) by mouth 3 (three) times daily. 90 tablet 3  . levETIRAcetam (KEPPRA) 500 MG tablet Take 1 tablet (500 mg total) by mouth 2 (two) times daily. 60 tablet 3  . omeprazole  (PRILOSEC) 20 MG capsule Take 1 capsule (20 mg total) by mouth 2 (two) times daily. 60 capsule 3   No current facility-administered medications on file prior to visit.    Allergies:  Allergies  Allergen Reactions  . Penicillins Other (See Comments)    Has patient had a PCN reaction causing immediate rash, facial/tongue/throat swelling, SOB or lightheadedness with hypotension: unknown Has patient had a PCN reaction causing severe rash involving mucus membranes or skin necrosis: unknown Has patient had a PCN reaction that required hospitalization unknown Has patient had a PCN reaction occurring within the last 10 years:no If all of the above answers are "NO", then may proceed wit   Past Medical History:  Past Medical History:  Diagnosis Date  . Pancreatitis    Past Surgical History:  Past Surgical History:  Procedure Laterality Date  . APPLICATION OF CRANIAL NAVIGATION N/A 01/09/2021   Procedure: APPLICATION OF CRANIAL NAVIGATION;  Surgeon: [name], Theodoro Doing, DO;  Location: [facility];  Service: Neurosurgery;  Laterality: N/A;  . CHOLECYSTECTOMY    . CRANIOTOMY N/A 01/09/2021   Procedure: CRANIOTOMY FOR TUMOR EXCISION;  Surgeon: Karsten Ro, DO;  Location: [facility];  Service: Neurosurgery;  Laterality: N/A;   Social History:  Social History   Socioeconomic History  . Marital status: Married    Spouse name: Not on file  . Number of children: Not on file  . Years of education: Not on file  . Highest education level: Not on file  Occupational History  . Not on file  Tobacco Use  . Smoking status: Current Every Day Smoker    Packs/day: 0.50  Types: Cigarettes  . Smokeless tobacco: Never Used  Vaping Use  . Vaping Use: Never used  Substance and Sexual Activity  . Alcohol use: No  . Drug use: Yes    Frequency: 2.0 times per week    Types: Marijuana  . Sexual activity: Not on file  Other Topics Concern  . Not on file  Social History Narrative  . Not on file   Social  Determinants of Health   Financial Resource Strain: Not on file  Food Insecurity: Not on file  Transportation Needs: Not on file  Physical Activity: Not on file  Stress: Not on file  Social Connections: Not on file  Intimate Partner Violence: Not on file   Family History:  Family History  Problem Relation Age of Onset  . Diabetes Mother   . Diabetes Other   . Heart attack Other   . Hypertension Other     Review of Systems: Constitutional: Doesn't report fevers, chills or abnormal weight loss Eyes: Doesn't report blurriness of vision Ears, nose, mouth, throat, and face: Doesn't report sore throat Respiratory: Doesn't report cough, dyspnea or wheezes Cardiovascular: Doesn't report palpitation, chest discomfort  Gastrointestinal:  Doesn't report nausea, constipation, diarrhea GU: Doesn't report incontinence Skin: Doesn't report skin rashes Neurological: Per HPI Musculoskeletal: Doesn't report joint pain Behavioral/Psych: Doesn't report anxiety  Physical Exam: Vitals:   01/26/21 1211  BP: 98/60  Pulse: (!) 56  Resp: 18  Temp: 98.1 F (36.7 C)  SpO2: 97%   KPS: 90. General: Alert, cooperative, pleasant, in no acute distress Head: Craniotomy scar w/ staples EENT: No conjunctival injection or scleral icterus.  Lungs: Resp effort normal Cardiac: Regular rate Abdomen: Non-distended abdomen Skin: No rashes cyanosis or petechiae. Extremities: No clubbing or edema  Neurologic Exam: Mental Status: Awake, alert, attentive to examiner. Oriented to self and environment. Language is fluent with intact comprehension.  Cranial Nerves: Visual acuity is grossly normal. Visual fields are full. Extra-ocular movements intact. No ptosis. Face is symmetric Motor: Tone and bulk are normal. Power is full in both arms and legs. Reflexes are symmetric, no pathologic reflexes present.  Sensory: Intact to light touch Gait: Normal.   Labs: I have reviewed the data as listed    Component  Value Date/Time   NA 136 01/10/2021 0653   NA 141 01/01/2021 0939   K 4.2 01/10/2021 0653   CL 102 01/10/2021 0653   CO2 26 01/10/2021 0653   GLUCOSE 104 (H) 01/10/2021 0653   BUN 15 01/10/2021 0653   BUN 12 01/01/2021 0939   CREATININE 0.83 01/10/2021 0653   CREATININE 0.80 12/16/2016 1603   CALCIUM 8.5 (L) 01/10/2021 0653   PROT 7.3 05/10/2019 0801   ALBUMIN 4.3 05/10/2019 0801   AST 20 05/10/2019 0801   ALT 23 05/10/2019 0801   ALKPHOS 55 05/10/2019 0801   BILITOT 1.4 (H) 05/10/2019 0801   GFRNONAA >60 01/10/2021 0653   GFRAA 107 01/01/2021 0939   Lab Results  Component Value Date   WBC 16.9 (H) 01/10/2021   NEUTROABS 11.1 (H) 05/08/2019   HGB 13.3 01/10/2021   HCT 37.9 (L) 01/10/2021   MCV 93.3 01/10/2021   PLT 167 01/10/2021    Imaging:  MR ANGIO HEAD WO CONTRAST  Result Date: 01/04/2021 CLINICAL DATA:  Frequent headaches. Dizziness and weakness for 3 weeks. EXAM: MRI HEAD WITHOUT AND WITH CONTRAST MRA HEAD WITHOUT CONTRAST TECHNIQUE: Multiplanar, multiecho pulse sequences of the brain and surrounding structures were obtained without and with intravenous contrast.  Angiographic images of the head were obtained using MRA technique without contrast. CONTRAST:  40mL GADAVIST GADOBUTROL 1 MMOL/ML IV SOLN COMPARISON:  None. FINDINGS: MRI HEAD FINDINGS Brain: There is a large, heterogeneous mass in the anterior right frontal lobe. The mass involves cortex and white matter with heterogeneously enhancing components measuring approximately 6.3 x 5.1 cm. Internal hemorrhage is noted in addition to cystic changes including along the posterior aspect of the mass. There is moderate T2 hyperintensity in the surrounding white matter which may reflect edema and potentially nonenhancing tumor, and this extends into the anterior aspect of the corpus callosum crossing the midline. There is extensive regional mass effect including on the lateral ventricles with mild dilatation of the occipital and  temporal horns of the lateral ventricle suggestive of early trapping. Leftward midline shift measures 1.8 cm. There is mild dural enhancement overlying the mass. No second enhancing brain lesion is identified, and the brain is normal in signal elsewhere. No acute infarct or extra-axial fluid collection is evident. Vascular: Major intracranial vascular flow voids are preserved. Leftward displacement of the anterior cerebral arteries by the mass. Skull and upper cervical spine: Unremarkable bone marrow signal. Sinuses/Orbits: Unremarkable orbits. Scattered mild mucosal thickening in the paranasal sinuses. Clear mastoid air cells. Other: None. MRA HEAD FINDINGS The visualized distal vertebral arteries are widely patent to the basilar and codominant. Patent PICA and SCA origins are seen bilaterally. The basilar artery is widely patent. Posterior communicating arteries are not clearly identified and may be diminutive or absent. The PCAs are patent without evidence of a significant proximal stenosis. The internal carotid arteries are widely patent from skull base to carotid termini. ACAs and MCAs are patent without evidence of a proximal branch occlusion or significant proximal stenosis. The ACAs are displaced leftward by the right frontal mass, and there is a large right ACA branch that courses into the mass. No aneurysm is identified. IMPRESSION: 1. Large, hemorrhagic right frontal mass most concerning for a high-grade primary neoplasm such as glioblastoma. Extensive regional edema and mass effect with edema and/or nonenhancing tumor extending into the corpus callosum. 2. 1.8 cm of leftward midline shift. 3. No major intracranial arterial occlusion or significant proximal stenosis. Displacement of the anterior cerebral arteries by the mass with a large right ACA branch coursing into/supplying the mass. These results will be called to the ordering clinician or representative by the Radiology Department at the imaging  location. Electronically Signed   By: Sebastian Ache M.D.   On: 01/04/2021 14:41   MR BRAIN W WO CONTRAST  Result Date: 01/10/2021 CLINICAL DATA:  Brain/CNS neoplasm, staging. Status post craniotomy. EXAM: MRI HEAD WITHOUT AND WITH CONTRAST TECHNIQUE: Multiplanar, multiecho pulse sequences of the brain and surrounding structures were obtained without and with intravenous contrast. CONTRAST:  7mL GADAVIST GADOBUTROL 1 MMOL/ML IV SOLN COMPARISON:  Prior brain MRI examinations 01/07/2021 and earlier. FINDINGS: Brain: Postoperative changes from interval resection of a large anterior right frontal lobe mass. Pneumocephalus and proteinaceous fluid/blood products within the resection cavity. Precontrast T1 hyperintense blood products along the margins of the resection cavity without definite abnormal enhancement. Small volume extra-axial proteinaceous fluid/blood products also overlying the right cerebral convexity. Similar appearance of T2/FLAIR hyperintense signal abnormality within the adjacent white matter and within the anterior corpus callosum, crossing midline. Persistent mass effect with partial effacement of the right lateral ventricle. Persistent although decreased leftward midline shift, now measuring 1.2 cm (previously 1.8 cm). Vascular: Expected proximal arterial flow voids. Skull and upper cervical  spine: Right frontal cranioplasty. No focal suspicious marrow lesion. Sinuses/Orbits: Visualized orbits show no acute finding. Mild ethmoid sinus mucosal thickening. IMPRESSION: Postoperative changes from interval resection of a large anterior right frontal lobe mass. Pneumocephalus and proteinaceous fluid/blood products within the resection cavity. T1 hyperintense blood products along the margins of the resection cavity without definite abnormal enhancement. Similar appearance of T2/FLAIR hyperintense signal abnormality within the adjacent white matter and within the anterior corpus callosum, crossing midline.  Findings are compatible with residual edema and possibly non-enhancing infiltrative tumor. Correlate with pending pathology. Additional small-volume extra-axial proteinaceous fluid/blood products overlying the right cerebral convexity. Persistent although decreased mass effect as described. This MRI will serve as a baseline for future follow-up examinations. Electronically Signed   By: Kellie Simmering DO   On: 01/10/2021 20:23   MR BRAIN W WO CONTRAST  Result Date: 01/07/2021 CLINICAL DATA:  Right frontal mass. EXAM: MRI HEAD WITHOUT AND WITH CONTRAST TECHNIQUE: Multiplanar, multiecho pulse sequences of the brain and surrounding structures were obtained without and with intravenous contrast. CONTRAST:  6.61mL GADAVIST GADOBUTROL 1 MMOL/ML IV SOLN COMPARISON:  01/04/2021 and prior. FINDINGS: Brain: Stable appearance of heterogenously enhancing partially cystic right frontal mass measuring 6.3 x 5.1 cm. Intralesional hemorrhage and restricted diffusion is unchanged. Confluent perilesional T2 hyperintense signal involving the anterior corpus callosum and traversing the midline is unchanged. Partial effacement of the right lateral ventricle with 1.8 cm leftward midline shift. Subfalcine herniation of parenchyma and prominence of the left lateral ventricle occipital and temporal horns is unchanged. No extra-axial fluid collection. No other enhancing lesion. Vascular: Major intracranial flow voids are proximally preserved. Leftward displacement of the ACAs secondary to mass effect is unchanged. Skull and upper cervical spine: Normal marrow signal. Sinuses/Orbits: Normal orbits. Clear paranasal sinuses. No mastoid effusion. Other: None. IMPRESSION: Stable appearance of 6.3 cm hemorrhagic and cystic right frontal mass involving the anterior corpus callosum and concerning for high-grade primary neoplasm. No other enhancing lesions. 1.8 cm leftward midline shift and prominence of the left lateral ventricle, unchanged.  Electronically Signed   By: Primitivo Gauze M.D.   On: 01/07/2021 14:20   MR BRAIN W WO CONTRAST  Result Date: 01/04/2021 CLINICAL DATA:  Frequent headaches. Dizziness and weakness for 3 weeks. EXAM: MRI HEAD WITHOUT AND WITH CONTRAST MRA HEAD WITHOUT CONTRAST TECHNIQUE: Multiplanar, multiecho pulse sequences of the brain and surrounding structures were obtained without and with intravenous contrast. Angiographic images of the head were obtained using MRA technique without contrast. CONTRAST:  71mL GADAVIST GADOBUTROL 1 MMOL/ML IV SOLN COMPARISON:  None. FINDINGS: MRI HEAD FINDINGS Brain: There is a large, heterogeneous mass in the anterior right frontal lobe. The mass involves cortex and white matter with heterogeneously enhancing components measuring approximately 6.3 x 5.1 cm. Internal hemorrhage is noted in addition to cystic changes including along the posterior aspect of the mass. There is moderate T2 hyperintensity in the surrounding white matter which may reflect edema and potentially nonenhancing tumor, and this extends into the anterior aspect of the corpus callosum crossing the midline. There is extensive regional mass effect including on the lateral ventricles with mild dilatation of the occipital and temporal horns of the lateral ventricle suggestive of early trapping. Leftward midline shift measures 1.8 cm. There is mild dural enhancement overlying the mass. No second enhancing brain lesion is identified, and the brain is normal in signal elsewhere. No acute infarct or extra-axial fluid collection is evident. Vascular: Major intracranial vascular flow voids are preserved. Leftward displacement of  the anterior cerebral arteries by the mass. Skull and upper cervical spine: Unremarkable bone marrow signal. Sinuses/Orbits: Unremarkable orbits. Scattered mild mucosal thickening in the paranasal sinuses. Clear mastoid air cells. Other: None. MRA HEAD FINDINGS The visualized distal vertebral arteries  are widely patent to the basilar and codominant. Patent PICA and SCA origins are seen bilaterally. The basilar artery is widely patent. Posterior communicating arteries are not clearly identified and may be diminutive or absent. The PCAs are patent without evidence of a significant proximal stenosis. The internal carotid arteries are widely patent from skull base to carotid termini. ACAs and MCAs are patent without evidence of a proximal branch occlusion or significant proximal stenosis. The ACAs are displaced leftward by the right frontal mass, and there is a large right ACA branch that courses into the mass. No aneurysm is identified. IMPRESSION: 1. Large, hemorrhagic right frontal mass most concerning for a high-grade primary neoplasm such as glioblastoma. Extensive regional edema and mass effect with edema and/or nonenhancing tumor extending into the corpus callosum. 2. 1.8 cm of leftward midline shift. 3. No major intracranial arterial occlusion or significant proximal stenosis. Displacement of the anterior cerebral arteries by the mass with a large right ACA branch coursing into/supplying the mass. These results will be called to the ordering clinician or representative by the Radiology Department at the imaging location. Electronically Signed   By: Logan Bores M.D.   On: 01/04/2021 14:41    Pathology: pending  Assessment/Plan Brain lesion [G93.9]  Troy Leon presents with clinical and radiographic syndrome localizing to the right frontal lobe.  Episodes prior to surgery of altered consciousness and motor function are consistent with focal seizures.  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  Alternately, the lesion could represent metastatic process or lymphoma, though less likely.  We had an extensive conversation with him and his wife regarding suspected pathology, prognosis, and available treatment pathways.  We are encouraged  by his age<50, good functional status, lack of comorbidity, good resection quality and favorable tumor location.  They understand we will need to review both histology and tumor genetics to obtain a complete picture of clinical status.    Esophageal irritation and hiccups may be secondary to GERD provoked by corticosteroids, given temporal nature of exposure and symptoms described.  We recommended decreasing decadron to $RemoveBef'4mg'CjGbpQHOqf$  daily x3 days, then $RemoveBe'2mg'DFbfeBLKl$  daily x3 days, then stopping therapy if tolerated.  Concurrently, he will resume dosing omeprazole $RemoveBeforeDEI'20mg'rYpWxWPHGQFsJbdX$  BID (had stopped this recently).  If issues persist despite steroid taper, we will contemplate trial of diltiazem and refer to GI for consideration of endoscopy or manometry.  We spent twenty additional minutes teaching regarding the natural history, biology, and historical experience in the treatment of brain tumors. We then discussed in detail the current recommendations for therapy focusing on the mode of administration, mechanism of action, anticipated toxicities, and quality of life issues associated with this plan. We also provided teaching sheets for the patient to take home as an additional resource.  We will ask that Troy Leon and his wife return to clinic next week for finalized pathology review, and continued medical management per above.  They will have consultation and CT-sim with radiation oncology next week as well, given likelihood of role of RT in this case.   All questions were answered. The patient knows to call the clinic with any problems, questions or concerns. No barriers to learning were detected.  The total time spent  in the encounter was 60 minutes and more than 50% was on counseling and review of test results   Ventura Sellers, MD Medical Director of Neuro-Oncology [facility] [place] 01/26/21 12:08 PM

## 2021-01-29 ENCOUNTER — Inpatient Hospital Stay: Payer: Medicaid Other

## 2021-01-29 ENCOUNTER — Telehealth: Payer: Self-pay | Admitting: Internal Medicine

## 2021-01-29 LAB — SURGICAL PATHOLOGY

## 2021-01-29 NOTE — Telephone Encounter (Signed)
Received a new pt referral from Dr. Alen Blew for Mr. Lortie to see Dr. Mickeal Skinner. Pt has been cld and scheduled to see Dr. Mickeal Skinner on 3/25 at 1030am.

## 2021-01-30 ENCOUNTER — Ambulatory Visit: Payer: Medicaid Other

## 2021-01-30 ENCOUNTER — Ambulatory Visit: Payer: Medicaid Other | Admitting: Radiation Oncology

## 2021-01-31 ENCOUNTER — Institutional Professional Consult (permissible substitution): Payer: Medicaid Other | Admitting: Radiation Oncology

## 2021-01-31 NOTE — Progress Notes (Signed)
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  He reports multiple episodes of " sudden dropping, legs getting out, feeling like I'm losing contact", followed by recovery associated with a period of fatigue.  MRI Brain 01/10/2021: Postoperative changes from interval resection of a large anterior right frontal lobe mass. Pneumocephalus and proteinaceous fluid/blood products within the resection cavity. Similar appearance of T2/FLAIR hyperintense signal abnormality within the adjacent white matter and within the anterior corpus callosum, crossing midline. Findings are compatible with residual edema and possibly non-enhancing infiltrative tumor.   MRI Brain 01/07/2021: Stable appearance of 6.3 cm hemorrhagic and cystic right frontal mass involving the anterior corpus callosum and concerning for high-grade primary neoplasm.  No other enhancing lesions.  1.8 cm leftward midline shift and prominence of the left lateral ventricle, unchanged.  MRI Brain 01/04/2021: Large, hemorrhagic right frontal mass most concerning for a high-grade primary neoplasm such as glioblastoma, measuring approximately 6.3 x 5.1 cm. Extensive regional edema and mass effect with edema and/or nonenhancing tumor extending into the corpus callosum. 1.8 cm of leftward midline shift.  Pathology: Right Crani 01/09/2021   Past or anticipated interventions, if any, per neurosurgery:  Dr. Reatha Armour -Craniotomy for tumor excision 01/09/2021 -Follow-up 01/31/2021 -Staples removed  Past or anticipated interventions, if any, per medical oncology:  Dr. Mickeal Skinner 02/02/2021 11:30 -  Dose of Decadron, if applicable: Tapered off, last dose 01/31/2021  Recent neurologic symptoms, if any:   Seizures: No  Headaches: Occasional dull ache.  Nausea: Yes  Dizziness/ataxia: None  Difficulty with hand coordination: Improving, easier to write.  Focal numbness/weakness:   No  Visual deficits/changes: No  Confusion/Memory deficits: No memory loss.  Wife states he has occasional confusion.   He is having some esophageal discomfort, feels like gravel when he swallows.  He has some bloating that is very uncomfortable.   SAFETY ISSUES:  Prior radiation? no  Pacemaker/ICD? No  Possible current pregnancy? n/a  Is the patient on methotrexate? No  Additional Complaints / other details:

## 2021-02-01 ENCOUNTER — Institutional Professional Consult (permissible substitution): Payer: Medicaid Other | Admitting: Radiation Oncology

## 2021-02-01 ENCOUNTER — Encounter: Payer: Self-pay | Admitting: Radiation Oncology

## 2021-02-01 ENCOUNTER — Other Ambulatory Visit: Payer: Self-pay

## 2021-02-01 ENCOUNTER — Ambulatory Visit: Payer: Medicaid Other

## 2021-02-01 ENCOUNTER — Ambulatory Visit: Payer: Medicaid Other | Admitting: Radiation Oncology

## 2021-02-01 ENCOUNTER — Ambulatory Visit
Admission: RE | Admit: 2021-02-01 | Discharge: 2021-02-01 | Disposition: A | Discharge status: Home / Self Care | Payer: Medicaid Other | Source: Ambulatory Visit | Attending: Radiation Oncology | Admitting: Radiation Oncology

## 2021-02-01 VITALS — BP 96/61 | HR 63 | Temp 98.6°F | Resp 16 | Ht 67.0 in | Wt 144.0 lb

## 2021-02-01 DIAGNOSIS — F17210 Nicotine dependence, cigarettes, uncomplicated: Secondary | ICD-10-CM | POA: Insufficient documentation

## 2021-02-01 DIAGNOSIS — R439 Unspecified disturbances of smell and taste: Secondary | ICD-10-CM | POA: Diagnosis not present

## 2021-02-01 DIAGNOSIS — C719 Malignant neoplasm of brain, unspecified: Secondary | ICD-10-CM | POA: Insufficient documentation

## 2021-02-01 DIAGNOSIS — R1013 Epigastric pain: Secondary | ICD-10-CM | POA: Diagnosis not present

## 2021-02-01 DIAGNOSIS — K209 Esophagitis, unspecified without bleeding: Secondary | ICD-10-CM | POA: Diagnosis not present

## 2021-02-01 DIAGNOSIS — Z79899 Other long term (current) drug therapy: Secondary | ICD-10-CM | POA: Insufficient documentation

## 2021-02-01 DIAGNOSIS — C711 Malignant neoplasm of frontal lobe: Secondary | ICD-10-CM

## 2021-02-01 DIAGNOSIS — R1310 Dysphagia, unspecified: Secondary | ICD-10-CM | POA: Diagnosis not present

## 2021-02-01 DIAGNOSIS — Z8719 Personal history of other diseases of the digestive system: Secondary | ICD-10-CM | POA: Insufficient documentation

## 2021-02-01 MED ORDER — NYSTATIN 100000 UNIT/ML MT SUSP: 5.0000 mL | Freq: Four times a day (QID) | OROMUCOSAL | 1 refills | Status: AC

## 2021-02-01 NOTE — Progress Notes (Signed)
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   HISTORY OF PRESENT ILLNESS: Troy Leon is a 50 y.o. male seen at the request of Dr. Reatha Armour for newly diagnosed glioma.  The patient originally presented with frequent headaches dizziness and weakness for approximately 3 weeks time to Dr. Wolfgang Phoenix.  He underwent an MRI of the brain with and without contrast on 01/04/2021 which showed a large hemorrhagic right frontal lobe mass concerning for high-grade primary neoplasm extensive regional edema and mass-effect with edema and/or nonenhancing tumor extending into the corpus callosum was identified with a 1.8 cm leftward shift.  MRI that day as well showed no intracranial arterial occlusion or stenosis there was displacement of the anterior cerebral arteries by the mass with a large right ACA branch coursing into the mass.  He met with Dr. Reatha Armour in the outpatient setting and proceeded to start Decadron and Keppra.  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  Given these findings he is seen today to discuss definitive radiotherapy.  He has met with Dr. Mickeal Skinner but saw him prior to pathology being available.  He is planning to see Dr. Mickeal Skinner tomorrow.     PREVIOUS RADIATION THERAPY: No   PAST MEDICAL HISTORY:  Past Medical History:  Diagnosis Date  . 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 Pancreatitis        PAST SURGICAL HISTORY: Past Surgical History:  Procedure Laterality Date  . APPLICATION OF CRANIAL NAVIGATION N/A 01/09/2021   Procedure: APPLICATION OF  CRANIAL NAVIGATION;  Surgeon: [name], Theodoro Doing, DO;  Location: [facility];  Service: Neurosurgery;  Laterality: N/A;  . CHOLECYSTECTOMY    . CRANIOTOMY N/A 01/09/2021   Procedure: CRANIOTOMY FOR TUMOR EXCISION;  Surgeon: Karsten Ro, DO;  Location: [facility];  Service: Neurosurgery;  Laterality: N/A;     FAMILY HISTORY:  Family History  Problem Relation Age of Onset  . Diabetes Mother   . Diabetes Other   . Heart attack Other   . Hypertension Other      SOCIAL HISTORY:  reports that he has been smoking cigarettes. He has been smoking about 0.50 packs per day. He has never used smokeless tobacco. He reports current drug use. Frequency: 2.00 times per week. Drug: Marijuana. He reports that he does not drink alcohol. The patient is married and lives in [place]. 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 He has a 15 year old daughter, and teenage nephew, and 27 one year old step son.   ALLERGIES: Penicillins   MEDICATIONS:  Current Outpatient Medications  Medication Sig Dispense Refill  . levETIRAcetam (KEPPRA) 500 MG tablet Take 1 tablet (500 mg total) by mouth 2 (two) times daily. 60 tablet 3  . nystatin (MYCOSTATIN) 100000 UNIT/ML suspension Take 5 mLs (500,000 Units total) by mouth 4 (four) times daily for 10 days. 200 mL 1  . omeprazole (PRILOSEC) 20 MG capsule Take 1 capsule (20 mg total) by mouth 2 (two) times daily. 60 capsule  3  . HYDROcodone-acetaminophen (NORCO/VICODIN) 5-325 MG tablet  (Patient not taking: No sig reported)    . ondansetron (ZOFRAN) 4 MG tablet Take 4 mg by mouth every 8 (eight) hours as needed for nausea or vomiting. (Patient not taking: Reported on 02/01/2021)     No current facility-administered medications for this encounter.     REVIEW OF SYSTEMS: On review of systems, the patient reports that he is doing better, he had to take some different medication to help his stomach with belching and irritation and  has been taking Prilosec at the recommendation by Dr. Mickeal Skinner. He has been having persistent taste disturbances with liquids that he believes are the result of IV contrast following administration of his first MRI brain. He is able to taste solid foods however. He does have discomfort with swallowing that has slowly improved but still upper chest discomfort with eating and also bloating. He does not describe pain or headaches, confusion, unintended movements, visual, or auditory disturbances. No other complaints are verbalized.    PHYSICAL EXAM:  Wt Readings from Last 3 Encounters:  02/01/21 144 lb (65.3 kg)  01/26/21 151 lb 1 oz (68.5 kg)  01/09/21 150 lb (68 kg)   Temp Readings from Last 3 Encounters:  02/01/21 98.6 F (37 C)  01/26/21 98.1 F (36.7 C) (Tympanic)  01/11/21 98.5 F (36.9 C) (Oral)   BP Readings from Last 3 Encounters:  02/01/21 96/61  01/26/21 98/60  01/11/21 114/81   Pulse Readings from Last 3 Encounters:  02/01/21 63  01/26/21 (!) 56  01/11/21 (!) 51   Pain Assessment Pain Score: 8 /10  In general this is a well appearing caucasian male in no acute distress. He's alert and oriented x4 and appropriate throughout the examination. Cardiopulmonary assessment is negative for acute distress and he exhibits normal effort. His craniotomy incision site is intact without erythema and extends from the right ear cephalad and ends along the hairline of his scalp in the left frontal region of the head. His tongue has white patches consistent with candida.   ECOG = 1  0 - Asymptomatic (Fully active, able to carry on all predisease activities without restriction)  1 - Symptomatic but completely ambulatory (Restricted in physically strenuous activity but ambulatory and able to carry out work of a light or sedentary nature. For example, light housework, office work)  2 - Symptomatic, <50% in bed during the day (Ambulatory and capable of all self care but unable to carry out  any work activities. Up and about more than 50% of waking hours)  3 - Symptomatic, >50% in bed, but not bedbound (Capable of only limited self-care, confined to bed or chair 50% or more of waking hours)  4 - Bedbound (Completely disabled. Cannot carry on any self-care. Totally confined to bed or chair)  5 - Death   Eustace Pen MM, Creech RH, Tormey DC, et al. [id number]). "Toxicity and response criteria of the [facility]". [place] Oncol. 5 (6): [phone]    LABORATORY DATA:  Lab Results  Component Value Date   WBC 16.9 (H) 01/10/2021   HGB 13.3 01/10/2021   HCT 37.9 (L) 01/10/2021   MCV 93.3 01/10/2021   PLT 167 01/10/2021   Lab Results  Component Value Date   NA 136 01/10/2021   K 4.2 01/10/2021   CL 102 01/10/2021   CO2 26 01/10/2021   Lab Results  Component Value Date   ALT 23 05/10/2019   AST 20 05/10/2019  ALKPHOS 55 05/10/2019   BILITOT 1.4 (H) 05/10/2019      RADIOGRAPHY: MR ANGIO HEAD WO CONTRAST  Result Date: 01/04/2021 CLINICAL DATA:  Frequent headaches. Dizziness and weakness for 3 weeks. EXAM: MRI HEAD WITHOUT AND WITH CONTRAST MRA HEAD WITHOUT CONTRAST TECHNIQUE: Multiplanar, multiecho pulse sequences of the brain and surrounding structures were obtained without and with intravenous contrast. Angiographic images of the head were obtained using MRA technique without contrast. CONTRAST:  76mL GADAVIST GADOBUTROL 1 MMOL/ML IV SOLN COMPARISON:  None. FINDINGS: MRI HEAD FINDINGS Brain: There is a large, heterogeneous mass in the anterior right frontal lobe. The mass involves cortex and white matter with heterogeneously enhancing components measuring approximately 6.3 x 5.1 cm. Internal hemorrhage is noted in addition to cystic changes including along the posterior aspect of the mass. There is moderate T2 hyperintensity in the surrounding white matter which may reflect edema and potentially nonenhancing tumor, and this extends into the anterior aspect of  the corpus callosum crossing the midline. There is extensive regional mass effect including on the lateral ventricles with mild dilatation of the occipital and temporal horns of the lateral ventricle suggestive of early trapping. Leftward midline shift measures 1.8 cm. There is mild dural enhancement overlying the mass. No second enhancing brain lesion is identified, and the brain is normal in signal elsewhere. No acute infarct or extra-axial fluid collection is evident. Vascular: Major intracranial vascular flow voids are preserved. Leftward displacement of the anterior cerebral arteries by the mass. Skull and upper cervical spine: Unremarkable bone marrow signal. Sinuses/Orbits: Unremarkable orbits. Scattered mild mucosal thickening in the paranasal sinuses. Clear mastoid air cells. Other: None. MRA HEAD FINDINGS The visualized distal vertebral arteries are widely patent to the basilar and codominant. Patent PICA and SCA origins are seen bilaterally. The basilar artery is widely patent. Posterior communicating arteries are not clearly identified and may be diminutive or absent. The PCAs are patent without evidence of a significant proximal stenosis. The internal carotid arteries are widely patent from skull base to carotid termini. ACAs and MCAs are patent without evidence of a proximal branch occlusion or significant proximal stenosis. The ACAs are displaced leftward by the right frontal mass, and there is a large right ACA branch that courses into the mass. No aneurysm is identified. IMPRESSION: 1. Large, hemorrhagic right frontal mass most concerning for a high-grade primary neoplasm such as glioblastoma. Extensive regional edema and mass effect with edema and/or nonenhancing tumor extending into the corpus callosum. 2. 1.8 cm of leftward midline shift. 3. No major intracranial arterial occlusion or significant proximal stenosis. Displacement of the anterior cerebral arteries by the mass with a large right ACA  branch coursing into/supplying the mass. These results will be called to the ordering clinician or representative by the Radiology Department at the imaging location. Electronically Signed   By: Logan Bores M.D.   On: 01/04/2021 14:41   MR BRAIN W WO CONTRAST  Result Date: 01/10/2021 CLINICAL DATA:  Brain/CNS neoplasm, staging. Status post craniotomy. EXAM: MRI HEAD WITHOUT AND WITH CONTRAST TECHNIQUE: Multiplanar, multiecho pulse sequences of the brain and surrounding structures were obtained without and with intravenous contrast. CONTRAST:  42mL GADAVIST GADOBUTROL 1 MMOL/ML IV SOLN COMPARISON:  Prior brain MRI examinations 01/07/2021 and earlier. FINDINGS: Brain: Postoperative changes from interval resection of a large anterior right frontal lobe mass. Pneumocephalus and proteinaceous fluid/blood products within the resection cavity. Precontrast T1 hyperintense blood products along the margins of the resection cavity without definite abnormal enhancement. Small volume  extra-axial proteinaceous fluid/blood products also overlying the right cerebral convexity. Similar appearance of T2/FLAIR hyperintense signal abnormality within the adjacent white matter and within the anterior corpus callosum, crossing midline. Persistent mass effect with partial effacement of the right lateral ventricle. Persistent although decreased leftward midline shift, now measuring 1.2 cm (previously 1.8 cm). Vascular: Expected proximal arterial flow voids. Skull and upper cervical spine: Right frontal cranioplasty. No focal suspicious marrow lesion. Sinuses/Orbits: Visualized orbits show no acute finding. Mild ethmoid sinus mucosal thickening. IMPRESSION: Postoperative changes from interval resection of a large anterior right frontal lobe mass. Pneumocephalus and proteinaceous fluid/blood products within the resection cavity. T1 hyperintense blood products along the margins of the resection cavity without definite abnormal enhancement.  Similar appearance of T2/FLAIR hyperintense signal abnormality within the adjacent white matter and within the anterior corpus callosum, crossing midline. Findings are compatible with residual edema and possibly non-enhancing infiltrative tumor. Correlate with pending pathology. Additional small-volume extra-axial proteinaceous fluid/blood products overlying the right cerebral convexity. Persistent although decreased mass effect as described. This MRI will serve as a baseline for future follow-up examinations. Electronically Signed   By: Jackey Loge DO   On: 01/10/2021 20:23   MR BRAIN W WO CONTRAST  Result Date: 01/07/2021 CLINICAL DATA:  Right frontal mass. EXAM: MRI HEAD WITHOUT AND WITH CONTRAST TECHNIQUE: Multiplanar, multiecho pulse sequences of the brain and surrounding structures were obtained without and with intravenous contrast. CONTRAST:  6.91mL GADAVIST GADOBUTROL 1 MMOL/ML IV SOLN COMPARISON:  01/04/2021 and prior. FINDINGS: Brain: Stable appearance of heterogenously enhancing partially cystic right frontal mass measuring 6.3 x 5.1 cm. Intralesional hemorrhage and restricted diffusion is unchanged. Confluent perilesional T2 hyperintense signal involving the anterior corpus callosum and traversing the midline is unchanged. Partial effacement of the right lateral ventricle with 1.8 cm leftward midline shift. Subfalcine herniation of parenchyma and prominence of the left lateral ventricle occipital and temporal horns is unchanged. No extra-axial fluid collection. No other enhancing lesion. Vascular: Major intracranial flow voids are proximally preserved. Leftward displacement of the ACAs secondary to mass effect is unchanged. Skull and upper cervical spine: Normal marrow signal. Sinuses/Orbits: Normal orbits. Clear paranasal sinuses. No mastoid effusion. Other: None. IMPRESSION: Stable appearance of 6.3 cm hemorrhagic and cystic right frontal mass involving the anterior corpus callosum and concerning  for high-grade primary neoplasm. No other enhancing lesions. 1.8 cm leftward midline shift and prominence of the left lateral ventricle, unchanged. Electronically Signed   By: Stana Bunting M.D.   On: 01/07/2021 14:20   MR BRAIN W WO CONTRAST  Result Date: 01/04/2021 CLINICAL DATA:  Frequent headaches. Dizziness and weakness for 3 weeks. EXAM: MRI HEAD WITHOUT AND WITH CONTRAST MRA HEAD WITHOUT CONTRAST TECHNIQUE: Multiplanar, multiecho pulse sequences of the brain and surrounding structures were obtained without and with intravenous contrast. Angiographic images of the head were obtained using MRA technique without contrast. CONTRAST:  61mL GADAVIST GADOBUTROL 1 MMOL/ML IV SOLN COMPARISON:  None. FINDINGS: MRI HEAD FINDINGS Brain: There is a large, heterogeneous mass in the anterior right frontal lobe. The mass involves cortex and white matter with heterogeneously enhancing components measuring approximately 6.3 x 5.1 cm. Internal hemorrhage is noted in addition to cystic changes including along the posterior aspect of the mass. There is moderate T2 hyperintensity in the surrounding white matter which may reflect edema and potentially nonenhancing tumor, and this extends into the anterior aspect of the corpus callosum crossing the midline. There is extensive regional mass effect including on the lateral ventricles with mild  dilatation of the occipital and temporal horns of the lateral ventricle suggestive of early trapping. Leftward midline shift measures 1.8 cm. There is mild dural enhancement overlying the mass. No second enhancing brain lesion is identified, and the brain is normal in signal elsewhere. No acute infarct or extra-axial fluid collection is evident. Vascular: Major intracranial vascular flow voids are preserved. Leftward displacement of the anterior cerebral arteries by the mass. Skull and upper cervical spine: Unremarkable bone marrow signal. Sinuses/Orbits: Unremarkable orbits. Scattered  mild mucosal thickening in the paranasal sinuses. Clear mastoid air cells. Other: None. MRA HEAD FINDINGS The visualized distal vertebral arteries are widely patent to the basilar and codominant. Patent PICA and SCA origins are seen bilaterally. The basilar artery is widely patent. Posterior communicating arteries are not clearly identified and may be diminutive or absent. The PCAs are patent without evidence of a significant proximal stenosis. The internal carotid arteries are widely patent from skull base to carotid termini. ACAs and MCAs are patent without evidence of a proximal branch occlusion or significant proximal stenosis. The ACAs are displaced leftward by the right frontal mass, and there is a large right ACA branch that courses into the mass. No aneurysm is identified. IMPRESSION: 1. Large, hemorrhagic right frontal mass most concerning for a high-grade primary neoplasm such as glioblastoma. Extensive regional edema and mass effect with edema and/or nonenhancing tumor extending into the corpus callosum. 2. 1.8 cm of leftward midline shift. 3. No major intracranial arterial occlusion or significant proximal stenosis. Displacement of the anterior cerebral arteries by the mass with a large right ACA branch coursing into/supplying the mass. These results will be called to the ordering clinician or representative by the Radiology Department at the imaging location. Electronically Signed   By: Logan Bores M.D.   On: 01/04/2021 14:41       IMPRESSION/PLAN: 1. 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 Dr. Lisbeth Renshaw discusses the pathology findings and reviews the nature of high-grade gliomas and the rationale for chemoradiation.  He will meet with Dr. Mickeal Skinner to discuss this further tomorrow.  Dr. Lisbeth Renshaw recommends a course of definitive radiotherapy to the surgical site and outlines the long-term management and follow-up of this disease process.  We discussed the risks, benefits, short, and  long term effects of radiotherapy, as well as the curative intent, and the patient is interested in proceeding. Dr. Lisbeth Renshaw discusses the delivery and logistics of radiotherapy and anticipates a course of 6 weeks of radiotherapy. Written consent is obtained and placed in the chart, a copy was provided to the patient.  He will simulate tomorrow afternoon. 2. Seizure management.  He has not had any seizure activity recently and Keppra medication management to Dr. Mickeal Skinner, he will be seen tomorrow to discuss further steps moving forward with systemic therapy. 3. Esophagitis, possible result of candida, and abdominal bloating. I recommended using nystatin suspension to try to treat what most likely would be candida esophagitis. He is in agreement to try this and if this does not improve symptoms, to meet with GI for EGD. He is also encouraged to try OTC Simethicone for bloating.   In a visit lasting 60 minutes, greater than 50% of the time was spent face to face discussing the patient's condition, in preparation for the discussion, and coordinating the patient's care.  The above documentation reflects my direct findings during this shared patient visit. Please see the separate note by Dr. Lisbeth Renshaw on this date for the remainder of the patient's plan  of care.    Carola Rhine, [facility]   **Disclaimer: This note was dictated with voice recognition software. Similar sounding words can inadvertently be transcribed and this note may contain transcription errors which may not have been corrected upon publication of note.**

## 2021-02-02 ENCOUNTER — Ambulatory Visit: Payer: Medicaid Other | Admitting: Internal Medicine

## 2021-02-02 ENCOUNTER — Telehealth: Payer: Self-pay

## 2021-02-02 ENCOUNTER — Inpatient Hospital Stay (HOSPITAL_BASED_OUTPATIENT_CLINIC_OR_DEPARTMENT_OTHER): Payer: Medicaid Other | Admitting: Internal Medicine

## 2021-02-02 ENCOUNTER — Ambulatory Visit
Admission: RE | Admit: 2021-02-02 | Discharge: 2021-02-02 | Disposition: A | Discharge status: Home / Self Care | Payer: Medicaid Other | Source: Ambulatory Visit | Attending: Radiation Oncology | Admitting: Radiation Oncology

## 2021-02-02 ENCOUNTER — Telehealth: Payer: Self-pay | Admitting: Pharmacist

## 2021-02-02 ENCOUNTER — Other Ambulatory Visit (HOSPITAL_COMMUNITY): Payer: Self-pay | Admitting: Internal Medicine

## 2021-02-02 VITALS — BP 103/72 | HR 61 | Temp 98.0°F | Resp 19 | Ht 67.0 in | Wt 146.3 lb

## 2021-02-02 VITALS — BP 106/67 | HR 64 | Temp 98.4°F | Resp 18 | Ht 67.0 in | Wt 145.8 lb

## 2021-02-02 DIAGNOSIS — C711 Malignant neoplasm of frontal lobe: Secondary | ICD-10-CM | POA: Insufficient documentation

## 2021-02-02 DIAGNOSIS — C719 Malignant neoplasm of brain, unspecified: Secondary | ICD-10-CM | POA: Diagnosis not present

## 2021-02-02 MED ORDER — TEMOZOLOMIDE 20 MG PO CAPS: 20.0000 mg | ORAL_CAPSULE | Freq: Every day | ORAL | 0 refills | Status: DC

## 2021-02-02 MED ORDER — ONDANSETRON HCL 8 MG PO TABS: 8.0000 mg | ORAL_TABLET | Freq: Two times a day (BID) | ORAL | 1 refills | Status: DC | PRN

## 2021-02-02 MED ORDER — SODIUM CHLORIDE 0.9% FLUSH
10.0000 mL | Freq: Once | INTRAVENOUS | Status: AC
  Administered 2021-02-02: 10 mL via INTRAVENOUS

## 2021-02-02 MED ORDER — TEMOZOLOMIDE 100 MG PO CAPS: 100.0000 mg | ORAL_CAPSULE | Freq: Every day | ORAL | 0 refills | Status: DC

## 2021-02-02 MED FILL — ONDANSETRON HCL 8 MG TABLET: 8 | 15 days supply | Qty: 30 | Fill #0

## 2021-02-02 NOTE — Progress Notes (Signed)
[facility] [facility] [place], [place] [address] [phone]   Interval Evaluation  Date of Service: 02/02/21 Patient Name: Troy Leon Patient MRN: [id number] Patient DOB: 07-Sep-1971 Provider: Ventura Sellers, MD  Identifying Statement:  DAMAR PETIT is a 50 y.o. male with right frontal oligodendroglioma grade III   Oncologic History: 01/09/21: Right frontal craniotomy, resection by Dr. Reatha Armour  Biomarkers:  MGMT Unknown.  IDH 1/2 Mutated.  EGFR Unknown  1p/19q co-deleted   Interval History:  AYYAN SITES presents today for follow up after finalization of pathology.  He describes significant improvement in GI symptoms, swallowing issues, reflex and hiccups since decreasing and discontinuing the decadron.  He had his staples removed earlier this week.  He otherwise has no new or progressive neurologic complaints, no seizures or headaches.  Had CT-sim with radiation oncology this morning, no issues.  H+P (01/26/21) Patient presented to medical attention in February 2022 with several weeks history of new onset headaches.  He describes holo-cranial pain, severe in nature, often associated with AM time period.  In addition, he describes multiple episodes of "sudden dropping, legs getting out, feeling like I'm losing contact", followed by recovery associated with a period of fatigue.  Notably, these episodes stopped following surgery/resection on 01/09/21 ([name]) and initiation of anti-seizure medication.  Currently he feels well, aside from frequent burping and spasming of "esophagus".  Taking 35m of decadron three times per day since discharge.  No functional deficits otherwise.  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  Medications: Current Outpatient Medications on File Prior to Visit  Medication Sig Dispense Refill  . HYDROcodone-acetaminophen (NORCO/VICODIN) 5-325 MG tablet  (Patient not taking: No sig  reported)    . levETIRAcetam (KEPPRA) 500 MG tablet Take 1 tablet (500 mg total) by mouth 2 (two) times daily. 60 tablet 3  . nystatin (MYCOSTATIN) 100000 UNIT/ML suspension Take 5 mLs (500,000 Units total) by mouth 4 (four) times daily for 10 days. 200 mL 1  . omeprazole (PRILOSEC) 20 MG capsule Take 1 capsule (20 mg total) by mouth 2 (two) times daily. 60 capsule 3  . ondansetron (ZOFRAN) 4 MG tablet Take 4 mg by mouth every 8 (eight) hours as needed for nausea or vomiting. (Patient not taking: Reported on 02/01/2021)     Current Facility-Administered Medications on File Prior to Visit  Medication Dose Route Frequency Provider Last Rate Last Admin  . sodium chloride flush (NS) 0.9 % injection 10 mL  10 mL Intravenous Once PHayden Pedro PA-C        Allergies:  Allergies  Allergen Reactions  . Penicillins Other (See Comments)    Has patient had a PCN reaction causing immediate rash, facial/tongue/throat swelling, SOB or lightheadedness with hypotension: unknown Has patient had a PCN reaction causing severe rash involving mucus membranes or skin necrosis: unknown Has patient had a PCN reaction that required hospitalization unknown Has patient had a PCN reaction occurring within the last 10 years:no If all of the above answers are "NO", then may proceed wit   Past Medical History:  Past Medical History:  Diagnosis Date  . [place] [place]  . Pancreatitis    Past Surgical History:  Past Surgical History:  Procedure Laterality Date  . APPLICATION OF CRANIAL NAVIGATION N/A 01/09/2021   Procedure: APPLICATION OF CRANIAL NAVIGATION;  Surgeon: [name], TTheodoro Doing DO;  [facility]  Service: Neurosurgery;  Laterality:  N/A;  . CHOLECYSTECTOMY    . CRANIOTOMY N/A 01/09/2021   Procedure: CRANIOTOMY FOR TUMOR EXCISION;  Surgeon: Karsten Ro, DO;  Location: [facility];  Service: Neurosurgery;  Laterality: N/A;   Social History:  Social History    Socioeconomic History  . Marital status: Married    Spouse name: Not on file  . Number of children: Not on file  . Years of education: Not on file  . Highest education level: Not on file  Occupational History  . Not on file  Tobacco Use  . Smoking status: Current Every Day Smoker    Packs/day: 0.50    Types: Cigarettes  . Smokeless tobacco: Never Used  Vaping Use  . Vaping Use: Never used  Substance and Sexual Activity  . Alcohol use: No  . Drug use: Yes    Frequency: 2.0 times per week    Types: Marijuana  . Sexual activity: Not on file  Other Topics Concern  . Not on file  Social History Narrative  . Not on file   Social Determinants of Health   Financial Resource Strain: Not on file  Food Insecurity: Not on file  Transportation Needs: Not on file  Physical Activity: Not on file  Stress: Not on file  Social Connections: Not on file  Intimate Partner Violence: Not on file   Family History:  Family History  Problem Relation Age of Onset  . Diabetes Mother   . Diabetes Other   . Heart attack Other   . Hypertension Other     Review of Systems: Constitutional: Doesn't report fevers, chills or abnormal weight loss Eyes: Doesn't report blurriness of vision Ears, nose, mouth, throat, and face: Doesn't report sore throat Respiratory: Doesn't report cough, dyspnea or wheezes Cardiovascular: Doesn't report palpitation, chest discomfort  Gastrointestinal:  Doesn't report nausea, constipation, diarrhea GU: Doesn't report incontinence Skin: Doesn't report skin rashes Neurological: Per HPI Musculoskeletal: Doesn't report joint pain Behavioral/Psych: Doesn't report anxiety  Physical Exam: Vitals:   02/02/21 1140  BP: 103/72  Pulse: 61  Resp: 19  Temp: 98 F (36.7 C)  SpO2: 100%   KPS: 90. General: Alert, cooperative, pleasant, in no acute distress Head: Craniotomy scar w/ staples EENT: No conjunctival injection or scleral icterus.  Lungs: Resp effort  normal Cardiac: Regular rate Abdomen: Non-distended abdomen Skin: No rashes cyanosis or petechiae. Extremities: No clubbing or edema  Neurologic Exam: Mental Status: Awake, alert, attentive to examiner. Oriented to self and environment. Language is fluent with intact comprehension.  Cranial Nerves: Visual acuity is grossly normal. Visual fields are full. Extra-ocular movements intact. No ptosis. Face is symmetric Motor: Tone and bulk are normal. Power is full in both arms and legs. Reflexes are symmetric, no pathologic reflexes present.  Sensory: Intact to light touch Gait: Normal.   Labs: I have reviewed the data as listed    Component Value Date/Time   NA 136 01/10/2021 0653   NA 141 01/01/2021 0939   K 4.2 01/10/2021 0653   CL 102 01/10/2021 0653   CO2 26 01/10/2021 0653   GLUCOSE 104 (H) 01/10/2021 0653   BUN 15 01/10/2021 0653   BUN 12 01/01/2021 0939   CREATININE 0.83 01/10/2021 0653   CREATININE 0.80 12/16/2016 1603   CALCIUM 8.5 (L) 01/10/2021 0653   PROT 7.3 05/10/2019 0801   ALBUMIN 4.3 05/10/2019 0801   AST 20 05/10/2019 0801   ALT 23 05/10/2019 0801   ALKPHOS 55 05/10/2019 0801   BILITOT 1.4 (H) 05/10/2019  0801   GFRNONAA >60 01/10/2021 0653   GFRAA 107 01/01/2021 0939   Lab Results  Component Value Date   WBC 16.9 (H) 01/10/2021   NEUTROABS 11.1 (H) 05/08/2019   HGB 13.3 01/10/2021   HCT 37.9 (L) 01/10/2021   MCV 93.3 01/10/2021   PLT 167 01/10/2021    Pathology:  SURGICAL PATHOLOGY  CASE: [id number]  PATIENT: Prentice Rennaker  Surgical Pathology Report   Clinical History: brain tumor (cm)    FINAL MICROSCOPIC DIAGNOSIS:   A. BRAIN TUMOR, RIGHT FRONTAL, RESECTION:  - Oligodendroglioma, IDH mutant and 1p/19q-codeleted, CNS WHO grade 3  - See comment   COMMENT:   The above diagnosis and following comment was rendered by Dr. Blanca Friend from [facility]. Please see EMR for complete copy of the  report.   Histopathologic  classification: Oligodendroglioma with microvascular  proliferation and necrosis molecular information: By  immunohistochemistry, IDH [id number] is positive and a TRX is retained.  1p/19q FISH shows codeletion.    Assessment/Plan World Health Organization grade 3 oligodendroglioma ([place]) [C71.9]  DICKSON KOSTELNIK is clinically stable today; we are glad his GI symptoms mostly resolved after cessation of decadron.    We had an additional conversation with him and his wife regarding suspected pathology, prognosis, and available treatment pathways for grade 3 oligodendroglioma.  We reasserted encouragement regadring his age<50, good functional status, lack of comorbidity, good resection quality and favorable tumor location.  Tumor genetics are clearly favorable here.  We ultimately recommended proceeding with course of intensity modulated radiation therapy and concurrent daily Temozolomide.  Radiation will be administered Mon-Fri over 6 weeks, Temodar will be dosed at 66m/m2 to be given daily over 42 days.  We reviewed side effects of temodar, including fatigue, nausea/vomiting, constipation, and cytopenias.  Informed consent was verbally obtained at bedside to proceed with oral chemotherapy.  Chemotherapy should be held for the following:  ANC less than 1,000  Platelets less than 100,000  LFT or creatinine greater than 2x ULN  If clinical concerns/contraindications develop  Every 2 weeks during radiation, labs will be checked accompanied by a clinical evaluation in the brain tumor clinic.  We will ask that RCyndia Bentand his wife return to clinic after week 2 of radiation, or sooner if needed.  All questions were answered. The patient knows to call the clinic with any problems, questions or concerns. No barriers to learning were detected.  The total time spent in the encounter was 40 minutes and more than 50% was on counseling and review of test results   ZVentura Sellers  MD Medical Director of Neuro-Oncology [facility]03/25/22 11:59 AM

## 2021-02-02 NOTE — Telephone Encounter (Signed)
Oral Oncology Patient Advocate Encounter  After completing a benefits investigation, prior authorization for Temodar is not required at this time through [facility].  Patient's copay is $3/each.     [place] [facility] Phone [phone] Fax [phone] 02/02/2021 3:39 PM

## 2021-02-02 NOTE — Progress Notes (Signed)
Has armband been applied? Yes  Does patient have an allergy to IV contrast dye?: No   Has patient ever received premedication for IV contrast dye?:  n/a  Does patient take metformin?: No  If patient does take metformin when was the last dose: n/a  Date of lab work: 01/10/2021 BUN: 15 CR: 0.83 eGfr: >60  IV site: Left Forearm  Has IV site been added to flowsheet?  Yes

## 2021-02-02 NOTE — Progress Notes (Signed)
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

## 2021-02-02 NOTE — Telephone Encounter (Signed)
Oral Oncology Pharmacist Encounter  Received new prescription for Temodar (temozolomide) for the treatment of grade III oligodendroglioma, IDH mutant, 1p/19q-codeleted in conjunction with radidation, planned duration 42 days.  Prescription dose and frequency assessed for appropriateness. Appropriate for therapy initiation.   CBC and BMP from 01/10/21 assessed, labs OK for treatment initiation.  Current medication list in Epic reviewed, no relevant/significant DDIs with Temodar identified.  Evaluated chart and no patient barriers to medication adherence noted.   Patient agreement for Temodar treatment documented in MD note on 02/02/21.  Prescription has been e-scribed to the [facility] for benefits analysis and approval.  Oral Oncology Clinic will continue to follow for insurance authorization, copayment issues, initial counseling and start date.  Leron Croak, PharmD, BCPS Hematology/Oncology Clinical Pharmacist [facility] [phone] 02/02/2021 3:44 PM

## 2021-02-05 ENCOUNTER — Inpatient Hospital Stay: Payer: Medicaid Other

## 2021-02-07 ENCOUNTER — Ambulatory Visit (INDEPENDENT_AMBULATORY_CARE_PROVIDER_SITE_OTHER): Payer: Medicaid Other | Admitting: Family Medicine

## 2021-02-07 ENCOUNTER — Other Ambulatory Visit: Payer: Self-pay

## 2021-02-07 VITALS — BP 114/60 | HR 73 | Temp 97.6°F | Ht 67.0 in | Wt 150.0 lb

## 2021-02-07 DIAGNOSIS — Z0000 Encounter for general adult medical examination without abnormal findings: Secondary | ICD-10-CM | POA: Diagnosis not present

## 2021-02-07 DIAGNOSIS — R739 Hyperglycemia, unspecified: Secondary | ICD-10-CM | POA: Diagnosis not present
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
## 2021-02-08 ENCOUNTER — Encounter (HOSPITAL_COMMUNITY): Payer: Self-pay

## 2021-02-13 ENCOUNTER — Other Ambulatory Visit (HOSPITAL_COMMUNITY): Payer: Self-pay

## 2021-02-13 MED FILL — Temozolomide Cap 20 MG: ORAL | 28 days supply | Qty: 28 | Fill #0 | Status: AC

## 2021-02-13 MED FILL — Temozolomide Cap 100 MG: ORAL | 28 days supply | Qty: 28 | Fill #0 | Status: AC

## 2021-02-13 NOTE — Telephone Encounter (Signed)
Oral Chemotherapy Pharmacist Encounter  I spoke with patient for overview of: Temodar for the treatment of grade III oligodendroglioma, IDH mutant, 1p/19q-codeleted in conjunction with radidation, planned duration 42 days.  Counseled patient on administration, dosing, side effects, monitoring, drug-food interactions, safe handling, storage, and disposal.  Patient will take Temodar $RemoveBefo'100mg'hwedxsVpJLA$  capsules and Temodar $RemoveBef'20mg'CVHlcDNTyw$  capsules, 120 mg total daily dose, by mouth once daily, may take at bedtime and on an empty stomach to decrease nausea and vomiting.  Patient will take Temodar concurrent with radiation for 42 days straight.  Discussed with patient methods of taking medication since patient endorsed difficultly swallowing any type of pill/capsules. Explained to patient that Temodar capsules can not be opened due to contents being erosive to mucus membranes. Recommended to patient trying to put full capsule on a small bite of applesauce to help with swallowing or considering purchasing "Pill glide" which is a product that can be purchased OTC that can help with swallowing for capsules/tablets. Patient willing to try capsules, but will reach out to MD's office if this is not feasible.   Temodar start date: 02/18/21 Radiation start date: 02/19/21   Patient will take Zofran $RemoveBef'8mg'rDHjxyDjIt$  tablet, 1 tablet by mouth 30-60 min prior to Temodar dose to help decrease N/V once starting adjuvant therapy. Prophylactic Zofran will not be used at initiation of concurrent phase, but will be initiated if nausea develops despite Temodar administration on an empty stomach and at bedtime.   Adverse effects include but are not limited to: nausea, vomiting, anorexia, GI upset, rash, drug fever, and fatigue. Rare but serious adverse effects of pneumocystis pneumonia and secondary malignancy also discussed.  PCP prophylaxis will not be initiated at this time, but may be added based on lymphocyte count in the future.  Reviewed with patient  importance of keeping a medication schedule and plan for any missed doses. No barriers to medication adherence identified.  Medication reconciliation performed and medication/allergy list updated.  Insurance authorization for Temodar has been obtained. Test claim at the pharmacy revealed copayment $6 for 1st fill of Temodar. This will ship from the [facility] on 02/14/21 to deliver to patient's home on 02/15/21.  Patient informed the pharmacy will reach out 5-7 days prior to needing next fill of Temodar to coordinate continued medication acquisition to prevent break in therapy.   All questions answered.  Troy Leon voiced understanding and appreciation.   Medication education handout and medication calendar placed in mail for patient. Patient knows to call the office with questions or concerns. Oral Chemotherapy Clinic phone number provided to patient.   Troy Leon, PharmD, BCPS Hematology/Oncology Clinical Pharmacist [facility] [phone] 02/13/2021 2:05 PM

## 2021-02-14 ENCOUNTER — Other Ambulatory Visit (HOSPITAL_COMMUNITY): Payer: Self-pay

## 2021-02-18 DIAGNOSIS — C711 Malignant neoplasm of frontal lobe: Secondary | ICD-10-CM | POA: Insufficient documentation

## 2021-02-19 ENCOUNTER — Ambulatory Visit
Admission: RE | Admit: 2021-02-19 | Discharge: 2021-02-19 | Disposition: A | Discharge status: Home / Self Care | Payer: Medicaid Other | Source: Ambulatory Visit | Attending: Radiation Oncology | Admitting: Radiation Oncology

## 2021-02-19 ENCOUNTER — Other Ambulatory Visit: Payer: Self-pay

## 2021-02-19 DIAGNOSIS — C711 Malignant neoplasm of frontal lobe: Secondary | ICD-10-CM | POA: Diagnosis not present

## 2021-02-20 ENCOUNTER — Ambulatory Visit
Admission: RE | Admit: 2021-02-20 | Discharge: 2021-02-20 | Disposition: A | Discharge status: Home / Self Care | Payer: Medicaid Other | Source: Ambulatory Visit | Attending: Radiation Oncology | Admitting: Radiation Oncology

## 2021-02-20 DIAGNOSIS — C711 Malignant neoplasm of frontal lobe: Secondary | ICD-10-CM | POA: Diagnosis not present

## 2021-02-21 ENCOUNTER — Telehealth: Payer: Self-pay | Admitting: Radiation Oncology

## 2021-02-21 ENCOUNTER — Other Ambulatory Visit: Payer: Self-pay

## 2021-02-21 ENCOUNTER — Other Ambulatory Visit: Payer: Self-pay | Admitting: Radiation Oncology

## 2021-02-21 ENCOUNTER — Ambulatory Visit
Admission: RE | Admit: 2021-02-21 | Discharge: 2021-02-21 | Disposition: A | Discharge status: Home / Self Care | Payer: Medicaid Other | Source: Ambulatory Visit | Attending: Radiation Oncology | Admitting: Radiation Oncology

## 2021-02-21 DIAGNOSIS — C711 Malignant neoplasm of frontal lobe: Secondary | ICD-10-CM | POA: Diagnosis not present

## 2021-02-21 MED ORDER — LORAZEPAM 0.5 MG PO TABS: ORAL_TABLET | ORAL | 0 refills | Status: DC

## 2021-02-21 NOTE — Telephone Encounter (Signed)
Phoned patient as requested by Shona Simpson, PA-C. Explained he should pick up the [place] just escribed to [employer]. Encouraged patient to take medication approximately 30-45 minutes prior to treatment. Stressed he will need a driver to and from his appointment because the Ativan will make him sleepy. Patient verbalized understanding and appreciation for the assistance.

## 2021-02-22 ENCOUNTER — Ambulatory Visit
Admission: RE | Admit: 2021-02-22 | Discharge: 2021-02-22 | Disposition: A | Discharge status: Home / Self Care | Payer: Medicaid Other | Source: Ambulatory Visit | Attending: Radiation Oncology | Admitting: Radiation Oncology

## 2021-02-22 DIAGNOSIS — C711 Malignant neoplasm of frontal lobe: Secondary | ICD-10-CM | POA: Diagnosis not present

## 2021-02-23 ENCOUNTER — Ambulatory Visit
Admission: RE | Admit: 2021-02-23 | Discharge: 2021-02-23 | Disposition: A | Discharge status: Home / Self Care | Payer: Medicaid Other | Source: Ambulatory Visit | Attending: Radiation Oncology | Admitting: Radiation Oncology

## 2021-02-23 ENCOUNTER — Other Ambulatory Visit: Payer: Self-pay

## 2021-02-23 DIAGNOSIS — C711 Malignant neoplasm of frontal lobe: Secondary | ICD-10-CM | POA: Diagnosis not present

## 2021-02-26 ENCOUNTER — Other Ambulatory Visit: Payer: Self-pay

## 2021-02-26 ENCOUNTER — Ambulatory Visit
Admission: RE | Admit: 2021-02-26 | Discharge: 2021-02-26 | Disposition: A | Discharge status: Home / Self Care | Payer: Medicaid Other | Source: Ambulatory Visit | Attending: Radiation Oncology | Admitting: Radiation Oncology

## 2021-02-26 DIAGNOSIS — C711 Malignant neoplasm of frontal lobe: Secondary | ICD-10-CM | POA: Diagnosis not present

## 2021-02-27 ENCOUNTER — Ambulatory Visit
Admission: RE | Admit: 2021-02-27 | Discharge: 2021-02-27 | Disposition: A | Discharge status: Home / Self Care | Payer: Medicaid Other | Source: Ambulatory Visit | Attending: Radiation Oncology | Admitting: Radiation Oncology

## 2021-02-27 DIAGNOSIS — C711 Malignant neoplasm of frontal lobe: Secondary | ICD-10-CM | POA: Diagnosis not present

## 2021-02-28 ENCOUNTER — Other Ambulatory Visit: Payer: Self-pay

## 2021-02-28 ENCOUNTER — Ambulatory Visit
Admission: RE | Admit: 2021-02-28 | Discharge: 2021-02-28 | Disposition: A | Discharge status: Home / Self Care | Payer: Medicaid Other | Source: Ambulatory Visit | Attending: Radiation Oncology | Admitting: Radiation Oncology

## 2021-02-28 DIAGNOSIS — C711 Malignant neoplasm of frontal lobe: Secondary | ICD-10-CM | POA: Diagnosis not present

## 2021-03-01 ENCOUNTER — Ambulatory Visit
Admission: RE | Admit: 2021-03-01 | Discharge: 2021-03-01 | Disposition: A | Discharge status: Home / Self Care | Payer: Medicaid Other | Source: Ambulatory Visit | Attending: Radiation Oncology | Admitting: Radiation Oncology

## 2021-03-01 ENCOUNTER — Inpatient Hospital Stay (HOSPITAL_BASED_OUTPATIENT_CLINIC_OR_DEPARTMENT_OTHER): Payer: Medicaid Other | Admitting: Internal Medicine

## 2021-03-01 ENCOUNTER — Inpatient Hospital Stay: Payer: Medicaid Other | Attending: Internal Medicine

## 2021-03-01 ENCOUNTER — Other Ambulatory Visit: Payer: Self-pay

## 2021-03-01 VITALS — BP 97/62 | HR 60 | Temp 98.0°F | Resp 20 | Ht 67.0 in | Wt 148.1 lb

## 2021-03-01 DIAGNOSIS — C719 Malignant neoplasm of brain, unspecified: Secondary | ICD-10-CM | POA: Diagnosis not present

## 2021-03-01 DIAGNOSIS — C711 Malignant neoplasm of frontal lobe: Secondary | ICD-10-CM | POA: Diagnosis not present

## 2021-03-01 LAB — CMP (CANCER CENTER ONLY)
ALT: 19 U/L (ref 0–44)
AST: 17 U/L (ref 15–41)
Albumin: 3.8 g/dL (ref 3.5–5.0)
Alkaline Phosphatase: 71 U/L (ref 38–126)
Anion gap: 10 (ref 5–15)
BUN: 11 mg/dL (ref 6–20)
CO2: 27 mmol/L (ref 22–32)
Calcium: 9.1 mg/dL (ref 8.9–10.3)
Chloride: 108 mmol/L (ref 98–111)
Creatinine: 1 mg/dL (ref 0.61–1.24)
GFR, Estimated: >60 mL/min (ref >60)
Glucose, Bld: 113 mg/dL — ABNORMAL HIGH (ref 70–99)
Potassium: 4.2 mmol/L (ref 3.5–5.1)
Sodium: 145 mmol/L (ref 135–145)
Total Bilirubin: 0.8 mg/dL (ref 0.3–1.2)
Total Protein: 6.6 g/dL (ref 6.5–8.1)

## 2021-03-01 LAB — CBC WITH DIFFERENTIAL (CANCER CENTER ONLY)
Abs Immature Granulocytes: 0.05 10*3/uL (ref 0.00–0.07)
Basophils Absolute: 0 10*3/uL (ref 0.0–0.1)
Basophils Relative: 1 %
Eosinophils Absolute: 0.1 10*3/uL (ref 0.0–0.5)
Eosinophils Relative: 1 %
HCT: 43.2 % (ref 39.0–52.0)
Hemoglobin: 15.1 g/dL (ref 13.0–17.0)
Immature Granulocytes: 1 %
Lymphocytes Relative: 26 %
Lymphs Abs: 2.2 10*3/uL (ref 0.7–4.0)
MCH: 33.3 pg (ref 26.0–34.0)
MCHC: 35 g/dL (ref 30.0–36.0)
MCV: 95.4 fL (ref 80.0–100.0)
Monocytes Absolute: 0.7 10*3/uL (ref 0.1–1.0)
Monocytes Relative: 9 %
Neutro Abs: 5.5 10*3/uL (ref 1.7–7.7)
Neutrophils Relative %: 62 %
Platelet Count: 211 10*3/uL (ref 150–400)
RBC: 4.53 MIL/uL (ref 4.22–5.81)
RDW: 13.2 % (ref 11.5–15.5)
WBC Count: 8.6 10*3/uL (ref 4.0–10.5)
nRBC: 0 % (ref 0.0–0.2)

## 2021-03-01 NOTE — Progress Notes (Signed)
[facility] [place], [place] [address] [phone]   Interval Evaluation  Date of Service: 03/01/21 Patient Name: KIEN MIRSKY Patient MRN: [id number] Patient DOB: June 20, 1971 Provider: Ventura Sellers, MD  Identifying Statement:  SAMI FROH is a 50 y.o. male with right frontal oligodendroglioma grade III   Oncologic History: 01/09/21: Right frontal craniotomy, resection by Dr. Reatha Armour  Biomarkers:  MGMT Unknown.  IDH 1/2 Mutated.  EGFR Unknown  1p/19q co-deleted   Interval History:  TYROME DONATELLI presents today for follow up after completing two weeks of radiation and Temodar.  He is tolerating treatment well, aside from claustrophobia with mask application (dosing ativan).   He otherwise has no new or progressive neurologic complaints, no seizures or headaches.  Remains active and independent.  H+P (01/26/21) Patient presented to medical attention in February 2022 with several weeks history of new onset headaches.  He describes holo-cranial pain, severe in nature, often associated with AM time period.  In addition, he describes multiple episodes of "sudden dropping, legs getting out, feeling like I'm losing contact", followed by recovery associated with a period of fatigue.  Notably, these episodes stopped following surgery/resection on 01/09/21 ([name]) and initiation of anti-seizure medication.  Currently he feels well, aside from frequent burping and spasming of "esophagus".  Taking $Remov'4mg'ZrTZtH$  of decadron three times per day since discharge.  No functional deficits otherwise.  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  Medications: Current Outpatient Medications on File Prior to Visit  Medication Sig Dispense Refill  . levETIRAcetam (KEPPRA) 500 MG tablet Take 1 tablet (500 mg total) by mouth 2 (two) times daily. 60 tablet 3  . LORazepam (ATIVAN) 0.5 MG tablet 1 tab po 30 minutes prior to radiation 30  tablet 0  . omeprazole (PRILOSEC) 20 MG capsule Take 1 capsule (20 mg total) by mouth 2 (two) times daily. 60 capsule 3  . ondansetron (ZOFRAN) 8 MG tablet TAKE 1 TABLET BY MOUTH 2 TIMES DAILY AS NEEDED FOR NAUSEA AND VOMITING,MAY TAKE 30-60 MIN PRIOR TO TEMODAR ADMINISTRATION IF NAUSEA/VOMITING OCCURS 30 tablet 1  . temozolomide (TEMODAR) 100 MG capsule TAKE 1 CAPSULE (100 MG TOTAL) BY MOUTH DAILY. MAY TAKE ON AN EMPTY STOMACH TO DECREASE NAUSEA & VOMITING. 42 capsule 0  . temozolomide (TEMODAR) 20 MG capsule TAKE 1 CAPSULE (20 MG TOTAL) BY MOUTH DAILY. MAY TAKE ON AN EMPTY STOMACH TO DECREASE NAUSEA & VOMITING. 42 capsule 0   No current facility-administered medications on file prior to visit.    Allergies:  Allergies  Allergen Reactions  . Penicillins Other (See Comments)    Has patient had a PCN reaction causing immediate rash, facial/tongue/throat swelling, SOB or lightheadedness with hypotension: unknown Has patient had a PCN reaction causing severe rash involving mucus membranes or skin necrosis: unknown Has patient had a PCN reaction that required hospitalization unknown Has patient had a PCN reaction occurring within the last 10 years:no If all of the above answers are "NO", then may proceed wit   Past Medical History:  Past Medical History:  Diagnosis Date  . Oligodendroglioma of frontal lobe ([place]) 01/2021  . Pancreatitis    Past Surgical History:  Past Surgical History:  Procedure Laterality Date  . APPLICATION OF CRANIAL NAVIGATION N/A 01/09/2021   Procedure: APPLICATION OF CRANIAL NAVIGATION;  Surgeon: [name], Theodoro Doing, DO;  Location: [facility];  Service: Neurosurgery;  Laterality: N/A;  . CHOLECYSTECTOMY    .  CRANIOTOMY N/A 01/09/2021   Procedure: CRANIOTOMY FOR TUMOR EXCISION;  Surgeon: Karsten Ro, DO;  Location: [facility];  Service: Neurosurgery;  Laterality: N/A;   Social History:  Social History   Socioeconomic History  . Marital status: Married    Spouse name: Not on  file  . Number of children: Not on file  . Years of education: Not on file  . Highest education level: Not on file  Occupational History  . Not on file  Tobacco Use  . Smoking status: Current Every Day Smoker    Packs/day: 0.50    Types: Cigarettes  . Smokeless tobacco: Never Used  Vaping Use  . Vaping Use: Never used  Substance and Sexual Activity  . Alcohol use: No  . Drug use: Yes    Frequency: 2.0 times per week    Types: Marijuana  . Sexual activity: Not on file  Other Topics Concern  . Not on file  Social History Narrative  . Not on file   Social Determinants of Health   Financial Resource Strain: Not on file  Food Insecurity: Not on file  Transportation Needs: Not on file  Physical Activity: Not on file  Stress: Not on file  Social Connections: Not on file  Intimate Partner Violence: Not on file   Family History:  Family History  Problem Relation Age of Onset  . Diabetes Mother   . Diabetes Other   . Heart attack Other   . Hypertension Other     Review of Systems: Constitutional: Doesn't report fevers, chills or abnormal weight loss Eyes: Doesn't report blurriness of vision Ears, nose, mouth, throat, and face: Doesn't report sore throat Respiratory: Doesn't report cough, dyspnea or wheezes Cardiovascular: Doesn't report palpitation, chest discomfort  Gastrointestinal:  Doesn't report nausea, constipation, diarrhea GU: Doesn't report incontinence Skin: Doesn't report skin rashes Neurological: Per HPI Musculoskeletal: Doesn't report joint pain Behavioral/Psych: Doesn't report anxiety  Physical Exam: Vitals:   03/01/21 1235  BP: 97/62  Pulse: 60  Resp: 20  Temp: 98 F (36.7 C)  SpO2: 99%   KPS: 90. General: Alert, cooperative, pleasant, in no acute distress Head: Craniotomy scar w/ staples EENT: No conjunctival injection or scleral icterus.  Lungs: Resp effort normal Cardiac: Regular rate Abdomen: Non-distended abdomen Skin: No rashes  cyanosis or petechiae. Extremities: No clubbing or edema  Neurologic Exam: Mental Status: Awake, alert, attentive to examiner. Oriented to self and environment. Language is fluent with intact comprehension.  Cranial Nerves: Visual acuity is grossly normal. Visual fields are full. Extra-ocular movements intact. No ptosis. Face is symmetric Motor: Tone and bulk are normal. Power is full in both arms and legs. Reflexes are symmetric, no pathologic reflexes present.  Sensory: Intact to light touch Gait: Normal.   Labs: I have reviewed the data as listed    Component Value Date/Time   NA 145 03/01/2021 1212   NA 141 01/01/2021 0939   K 4.2 03/01/2021 1212   CL 108 03/01/2021 1212   CO2 27 03/01/2021 1212   GLUCOSE 113 (H) 03/01/2021 1212   BUN 11 03/01/2021 1212   BUN 12 01/01/2021 0939   CREATININE 1.00 03/01/2021 1212   CREATININE 0.80 12/16/2016 1603   CALCIUM 9.1 03/01/2021 1212   PROT 6.6 03/01/2021 1212   ALBUMIN 3.8 03/01/2021 1212   AST 17 03/01/2021 1212   ALT 19 03/01/2021 1212   ALKPHOS 71 03/01/2021 1212   BILITOT 0.8 03/01/2021 1212   GFRNONAA >60 03/01/2021 1212   GFRAA  107 01/01/2021 0939   Lab Results  Component Value Date   WBC 8.6 03/01/2021   NEUTROABS 5.5 03/01/2021   HGB 15.1 03/01/2021   HCT 43.2 03/01/2021   MCV 95.4 03/01/2021   PLT 211 03/01/2021    Assessment/Plan World Health Organization grade 3 oligodendroglioma ([place]) [C71.9]  MARIN MILLEY is clinically stable today, now having completed 2 weeks of IMRT and concurrent Temodar.  We recommended continuing with course of intensity modulated radiation therapy and concurrent daily Temozolomide.  Radiation will be administered Mon-Fri over 6 weeks, Temodar will be dosed at $Remove'75mg'sKGARlt$ /m2 to be given daily over 42 days.  We reviewed side effects of temodar, including fatigue, nausea/vomiting, constipation, and cytopenias.  Informed consent was verbally obtained at bedside to proceed with oral  chemotherapy.  Chemotherapy should be held for the following:  ANC less than 1,000  Platelets less than 100,000  LFT or creatinine greater than 2x ULN  If clinical concerns/contraindications develop  Every 2 weeks during radiation, labs will be checked accompanied by a clinical evaluation in the brain tumor clinic.  We will ask that Cyndia Bent and his wife return to clinic after week 4 of radiation, with labs for evaluation.  All questions were answered. The patient knows to call the clinic with any problems, questions or concerns. No barriers to learning were detected.  I have spent a total of 30 minutes of face-to-face and non-face-to-face time, excluding clinical staff time, preparing to see patient, ordering tests and/or medications, counseling the patient, and independently interpreting results and communicating results to the patient/family/caregiver    Ventura Sellers, MD Medical Director of Neuro-Oncology [facility] at [place] 03/01/21 1:08 PM

## 2021-03-02 ENCOUNTER — Ambulatory Visit
Admission: RE | Admit: 2021-03-02 | Discharge: 2021-03-02 | Disposition: A | Discharge status: Home / Self Care | Payer: Medicaid Other | Source: Ambulatory Visit | Attending: Radiation Oncology | Admitting: Radiation Oncology

## 2021-03-02 DIAGNOSIS — C711 Malignant neoplasm of frontal lobe: Secondary | ICD-10-CM | POA: Diagnosis not present

## 2021-03-05 ENCOUNTER — Ambulatory Visit
Admission: RE | Admit: 2021-03-05 | Discharge: 2021-03-05 | Disposition: A | Discharge status: Home / Self Care | Payer: Medicaid Other | Source: Ambulatory Visit | Attending: Radiation Oncology | Admitting: Radiation Oncology

## 2021-03-05 ENCOUNTER — Other Ambulatory Visit (HOSPITAL_COMMUNITY): Payer: Self-pay

## 2021-03-05 ENCOUNTER — Other Ambulatory Visit: Payer: Self-pay

## 2021-03-05 DIAGNOSIS — C711 Malignant neoplasm of frontal lobe: Secondary | ICD-10-CM | POA: Diagnosis not present

## 2021-03-05 MED FILL — Temozolomide Cap 100 MG: ORAL | 14 days supply | Qty: 14 | Fill #1 | Status: CN

## 2021-03-05 MED FILL — Temozolomide Cap 20 MG: ORAL | 14 days supply | Qty: 14 | Fill #1 | Status: CN

## 2021-03-06 ENCOUNTER — Ambulatory Visit
Admission: RE | Admit: 2021-03-06 | Discharge: 2021-03-06 | Disposition: A | Discharge status: Home / Self Care | Payer: Medicaid Other | Source: Ambulatory Visit | Attending: Radiation Oncology | Admitting: Radiation Oncology

## 2021-03-06 ENCOUNTER — Other Ambulatory Visit (HOSPITAL_COMMUNITY): Payer: Self-pay

## 2021-03-06 DIAGNOSIS — C711 Malignant neoplasm of frontal lobe: Secondary | ICD-10-CM | POA: Diagnosis not present

## 2021-03-07 ENCOUNTER — Other Ambulatory Visit: Payer: Self-pay

## 2021-03-07 ENCOUNTER — Ambulatory Visit
Admission: RE | Admit: 2021-03-07 | Discharge: 2021-03-07 | Disposition: A | Discharge status: Home / Self Care | Payer: Medicaid Other | Source: Ambulatory Visit | Attending: Radiation Oncology | Admitting: Radiation Oncology

## 2021-03-07 DIAGNOSIS — C711 Malignant neoplasm of frontal lobe: Secondary | ICD-10-CM | POA: Diagnosis not present

## 2021-03-08 ENCOUNTER — Other Ambulatory Visit (HOSPITAL_COMMUNITY): Payer: Self-pay

## 2021-03-08 ENCOUNTER — Ambulatory Visit
Admission: RE | Admit: 2021-03-08 | Discharge: 2021-03-08 | Disposition: A | Discharge status: Home / Self Care | Payer: Medicaid Other | Source: Ambulatory Visit | Attending: Radiation Oncology | Admitting: Radiation Oncology

## 2021-03-08 DIAGNOSIS — C711 Malignant neoplasm of frontal lobe: Secondary | ICD-10-CM | POA: Diagnosis not present

## 2021-03-09 ENCOUNTER — Ambulatory Visit
Admission: RE | Admit: 2021-03-09 | Discharge: 2021-03-09 | Disposition: A | Discharge status: Home / Self Care | Payer: Medicaid Other | Source: Ambulatory Visit | Attending: Radiation Oncology | Admitting: Radiation Oncology

## 2021-03-09 ENCOUNTER — Other Ambulatory Visit: Payer: Self-pay

## 2021-03-09 DIAGNOSIS — C711 Malignant neoplasm of frontal lobe: Secondary | ICD-10-CM | POA: Diagnosis not present

## 2021-03-12 ENCOUNTER — Other Ambulatory Visit: Payer: Self-pay

## 2021-03-12 ENCOUNTER — Ambulatory Visit
Admission: RE | Admit: 2021-03-12 | Discharge: 2021-03-12 | Disposition: A | Discharge status: Home / Self Care | Payer: Medicaid Other | Source: Ambulatory Visit | Attending: Radiation Oncology | Admitting: Radiation Oncology

## 2021-03-12 DIAGNOSIS — G939 Disorder of brain, unspecified: Secondary | ICD-10-CM | POA: Insufficient documentation

## 2021-03-12 DIAGNOSIS — C711 Malignant neoplasm of frontal lobe: Secondary | ICD-10-CM | POA: Diagnosis not present

## 2021-03-13 ENCOUNTER — Other Ambulatory Visit: Payer: Self-pay | Admitting: Internal Medicine

## 2021-03-13 ENCOUNTER — Ambulatory Visit
Admission: RE | Admit: 2021-03-13 | Discharge: 2021-03-13 | Disposition: A | Discharge status: Home / Self Care | Payer: Medicaid Other | Source: Ambulatory Visit | Attending: Radiation Oncology | Admitting: Radiation Oncology

## 2021-03-13 ENCOUNTER — Other Ambulatory Visit (HOSPITAL_COMMUNITY): Payer: Self-pay

## 2021-03-13 ENCOUNTER — Other Ambulatory Visit: Payer: Self-pay

## 2021-03-13 DIAGNOSIS — G939 Disorder of brain, unspecified: Secondary | ICD-10-CM | POA: Diagnosis not present

## 2021-03-13 DIAGNOSIS — C711 Malignant neoplasm of frontal lobe: Secondary | ICD-10-CM | POA: Diagnosis not present

## 2021-03-14 ENCOUNTER — Ambulatory Visit
Admission: RE | Admit: 2021-03-14 | Discharge: 2021-03-14 | Disposition: A | Discharge status: Home / Self Care | Payer: Medicaid Other | Source: Ambulatory Visit | Attending: Radiation Oncology | Admitting: Radiation Oncology

## 2021-03-14 ENCOUNTER — Other Ambulatory Visit: Payer: Self-pay

## 2021-03-14 DIAGNOSIS — G939 Disorder of brain, unspecified: Secondary | ICD-10-CM | POA: Diagnosis not present

## 2021-03-14 DIAGNOSIS — C711 Malignant neoplasm of frontal lobe: Secondary | ICD-10-CM | POA: Diagnosis not present

## 2021-03-15 ENCOUNTER — Other Ambulatory Visit: Payer: Self-pay

## 2021-03-15 ENCOUNTER — Other Ambulatory Visit: Payer: Medicaid Other

## 2021-03-15 ENCOUNTER — Ambulatory Visit: Payer: Medicaid Other | Admitting: Internal Medicine

## 2021-03-15 ENCOUNTER — Inpatient Hospital Stay (HOSPITAL_BASED_OUTPATIENT_CLINIC_OR_DEPARTMENT_OTHER): Payer: Medicaid Other | Admitting: Internal Medicine

## 2021-03-15 ENCOUNTER — Ambulatory Visit
Admission: RE | Admit: 2021-03-15 | Discharge: 2021-03-15 | Disposition: A | Discharge status: Home / Self Care | Payer: Medicaid Other | Source: Ambulatory Visit | Attending: Radiation Oncology | Admitting: Radiation Oncology

## 2021-03-15 ENCOUNTER — Inpatient Hospital Stay: Payer: Medicaid Other

## 2021-03-15 VITALS — BP 99/58 | HR 57 | Temp 97.9°F | Resp 15 | Ht 67.0 in | Wt 145.1 lb

## 2021-03-15 DIAGNOSIS — C719 Malignant neoplasm of brain, unspecified: Secondary | ICD-10-CM

## 2021-03-15 DIAGNOSIS — G939 Disorder of brain, unspecified: Secondary | ICD-10-CM | POA: Diagnosis not present

## 2021-03-15 DIAGNOSIS — C711 Malignant neoplasm of frontal lobe: Secondary | ICD-10-CM | POA: Insufficient documentation

## 2021-03-15 LAB — CBC WITH DIFFERENTIAL (CANCER CENTER ONLY)
Abs Immature Granulocytes: 0.02 10*3/uL (ref 0.00–0.07)
Basophils Absolute: 0.1 10*3/uL (ref 0.0–0.1)
Basophils Relative: 1 %
Eosinophils Absolute: 0.2 10*3/uL (ref 0.0–0.5)
Eosinophils Relative: 2 %
HCT: 45 % (ref 39.0–52.0)
Hemoglobin: 14.9 g/dL (ref 13.0–17.0)
Immature Granulocytes: 0 %
Lymphocytes Relative: 24 %
Lymphs Abs: 1.9 10*3/uL (ref 0.7–4.0)
MCH: 32.3 pg (ref 26.0–34.0)
MCHC: 33.1 g/dL (ref 30.0–36.0)
MCV: 97.4 fL (ref 80.0–100.0)
Monocytes Absolute: 0.6 10*3/uL (ref 0.1–1.0)
Monocytes Relative: 8 %
Neutro Abs: 5 10*3/uL (ref 1.7–7.7)
Neutrophils Relative %: 65 %
Platelet Count: 221 10*3/uL (ref 150–400)
RBC: 4.62 MIL/uL (ref 4.22–5.81)
RDW: 13 % (ref 11.5–15.5)
WBC Count: 7.8 10*3/uL (ref 4.0–10.5)
nRBC: 0 % (ref 0.0–0.2)

## 2021-03-15 LAB — CMP (CANCER CENTER ONLY)
ALT: 15 U/L (ref 0–44)
AST: 18 U/L (ref 15–41)
Albumin: 3.9 g/dL (ref 3.5–5.0)
Alkaline Phosphatase: 70 U/L (ref 38–126)
Anion gap: 10 (ref 5–15)
BUN: 11 mg/dL (ref 6–20)
CO2: 28 mmol/L (ref 22–32)
Calcium: 9.3 mg/dL (ref 8.9–10.3)
Chloride: 108 mmol/L (ref 98–111)
Creatinine: 1.08 mg/dL (ref 0.61–1.24)
GFR, Estimated: >60 mL/min (ref >60)
Glucose, Bld: 100 mg/dL — ABNORMAL HIGH (ref 70–99)
Potassium: 3.9 mmol/L (ref 3.5–5.1)
Sodium: 146 mmol/L — ABNORMAL HIGH (ref 135–145)
Total Bilirubin: 0.9 mg/dL (ref 0.3–1.2)
Total Protein: 6.6 g/dL (ref 6.5–8.1)

## 2021-03-15 NOTE — Progress Notes (Signed)
[facility] [place], [place] [address] [phone]   Interval Evaluation  Date of Service: 03/15/21 Patient Name: Troy Leon Patient MRN: [id number] Patient DOB: 1970-12-23 Provider: Ventura Sellers, MD  Identifying Statement:  Troy Leon is a 50 y.o. male with right frontal oligodendroglioma grade III   Oncologic History 01/09/21: Right frontal craniotomy, resection by Dr. Reatha Armour; path demonstrates oligodendroglioma WHO grade 3, IDH-1 mutant, 1p/19q co-deleted  Biomarkers:  MGMT Unknown.  IDH 1/2 Mutated.  EGFR Unknown  1p/19q co-deleted   Interval History:  Troy Leon presents today for follow up after completing four weeks of radiation and Temodar.  He is tolerating treatment well.  Continues to experience claustrophobia during treatment, ativan is helpful.  He otherwise has no new or progressive neurologic complaints, no seizures or headaches.  Remains active and independent.  H+P (01/26/21) Patient presented to medical attention in February 2022 with several weeks history of new onset headaches.  He describes holo-cranial pain, severe in nature, often associated with AM time period.  In addition, he describes multiple episodes of "sudden dropping, legs getting out, feeling like I'm losing contact", followed by recovery associated with a period of fatigue.  Notably, these episodes stopped following surgery/resection on 01/09/21 ([name]) and initiation of anti-seizure medication.  Currently he feels well, aside from frequent burping and spasming of "esophagus".  Taking $Remov'4mg'rmpUqW$  of decadron three times per day since discharge.  No functional deficits otherwise.  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  Medications: Current Outpatient Medications on File Prior to Visit  Medication Sig Dispense Refill  . levETIRAcetam (KEPPRA) 500 MG tablet Take 1 tablet (500 mg total) by mouth 2 (two) times daily.  60 tablet 3  . LORazepam (ATIVAN) 0.5 MG tablet 1 tab po 30 minutes prior to radiation 30 tablet 0  . omeprazole (PRILOSEC) 20 MG capsule Take 1 capsule (20 mg total) by mouth 2 (two) times daily. 60 capsule 3  . ondansetron (ZOFRAN) 8 MG tablet TAKE 1 TABLET BY MOUTH 2 TIMES DAILY AS NEEDED FOR NAUSEA AND VOMITING,MAY TAKE 30-60 MIN PRIOR TO TEMODAR ADMINISTRATION IF NAUSEA/VOMITING OCCURS 30 tablet 1  . temozolomide (TEMODAR) 100 MG capsule TAKE 1 CAPSULE (100 MG TOTAL) BY MOUTH DAILY. MAY TAKE ON AN EMPTY STOMACH TO DECREASE NAUSEA & VOMITING. 42 capsule 0  . temozolomide (TEMODAR) 20 MG capsule TAKE 1 CAPSULE (20 MG TOTAL) BY MOUTH DAILY. MAY TAKE ON AN EMPTY STOMACH TO DECREASE NAUSEA & VOMITING. 42 capsule 0   No current facility-administered medications on file prior to visit.    Allergies:  Allergies  Allergen Reactions  . Penicillins Other (See Comments)    Has patient had a PCN reaction causing immediate rash, facial/tongue/throat swelling, SOB or lightheadedness with hypotension: unknown Has patient had a PCN reaction causing severe rash involving mucus membranes or skin necrosis: unknown Has patient had a PCN reaction that required hospitalization unknown Has patient had a PCN reaction occurring within the last 10 years:no If all of the above answers are "NO", then may proceed wit   Past Medical History:  Past Medical History:  Diagnosis Date  . Oligodendroglioma of frontal lobe ([place]) 01/2021  . Pancreatitis    Past Surgical History:  Past Surgical History:  Procedure Laterality Date  . APPLICATION OF CRANIAL NAVIGATION N/A 01/09/2021   Procedure: APPLICATION OF CRANIAL NAVIGATION;  Surgeon: Karsten Ro, DO;  Location: [facility];  Service: Neurosurgery;  Laterality: N/A;  . CHOLECYSTECTOMY    . CRANIOTOMY N/A 01/09/2021   Procedure: CRANIOTOMY FOR TUMOR EXCISION;  Surgeon: Bethann Goo, DO;  [facility];  Service: Neurosurgery;  Laterality: N/A;   Social History:   Social History   Socioeconomic History  . Marital status: Married    Spouse name: Not on file  . Number of children: Not on file  . Years of education: Not on file  . Highest education level: Not on file  Occupational History  . Not on file  Tobacco Use  . Smoking status: Current Every Day Smoker    Packs/day: 0.50    Types: Cigarettes  . Smokeless tobacco: Never Used  Vaping Use  . Vaping Use: Never used  Substance and Sexual Activity  . Alcohol use: No  . Drug use: Yes    Frequency: 2.0 times per week    Types: Marijuana  . Sexual activity: Not on file  Other Topics Concern  . Not on file  Social History Narrative  . Not on file   Social Determinants of Health   Financial Resource Strain: Not on file  Food Insecurity: Not on file  Transportation Needs: Not on file  Physical Activity: Not on file  Stress: Not on file  Social Connections: Not on file  Intimate Partner Violence: Not on file   Family History:  Family History  Problem Relation Age of Onset  . Diabetes Mother   . Diabetes Other   . Heart attack Other   . Hypertension Other     Review of Systems: Constitutional: Doesn't report fevers, chills or abnormal weight loss Eyes: Doesn't report blurriness of vision Ears, nose, mouth, throat, and face: Doesn't report sore throat Respiratory: Doesn't report cough, dyspnea or wheezes Cardiovascular: Doesn't report palpitation, chest discomfort  Gastrointestinal:  Doesn't report nausea, constipation, diarrhea GU: Doesn't report incontinence Skin: Doesn't report skin rashes Neurological: Per HPI Musculoskeletal: Doesn't report joint pain Behavioral/Psych: Doesn't report anxiety  Physical Exam: There were no vitals filed for this visit. KPS: 90. General: Alert, cooperative, pleasant, in no acute distress Head: Craniotomy scar w/ staples EENT: No conjunctival injection or scleral icterus.  Lungs: Resp effort normal Cardiac: Regular rate Abdomen:  Non-distended abdomen Skin: No rashes cyanosis or petechiae. Extremities: No clubbing or edema  Neurologic Exam: Mental Status: Awake, alert, attentive to examiner. Oriented to self and environment. Language is fluent with intact comprehension.  Cranial Nerves: Visual acuity is grossly normal. Visual fields are full. Extra-ocular movements intact. No ptosis. Face is symmetric Motor: Tone and bulk are normal. Power is full in both arms and legs. Reflexes are symmetric, no pathologic reflexes present.  Sensory: Intact to light touch Gait: Normal.   Labs: I have reviewed the data as listed    Component Value Date/Time   NA 145 03/01/2021 1212   NA 141 01/01/2021 0939   K 4.2 03/01/2021 1212   CL 108 03/01/2021 1212   CO2 27 03/01/2021 1212   GLUCOSE 113 (H) 03/01/2021 1212   BUN 11 03/01/2021 1212   BUN 12 01/01/2021 0939   CREATININE 1.00 03/01/2021 1212   CREATININE 0.80 12/16/2016 1603   CALCIUM 9.1 03/01/2021 1212   PROT 6.6 03/01/2021 1212   ALBUMIN 3.8 03/01/2021 1212   AST 17 03/01/2021 1212   ALT 19 03/01/2021 1212   ALKPHOS 71 03/01/2021 1212   BILITOT 0.8 03/01/2021 1212   GFRNONAA >60 03/01/2021 1212   GFRAA 107 01/01/2021 0939  Lab Results  Component Value Date   WBC 8.6 03/01/2021   NEUTROABS 5.5 03/01/2021   HGB 15.1 03/01/2021   HCT 43.2 03/01/2021   MCV 95.4 03/01/2021   PLT 211 03/01/2021    Assessment/Plan World Health Organization grade 3 oligodendroglioma ([place]) [C71.9]  Troy Leon is clinically stable today, now having completed 4 weeks of IMRT and concurrent Temodar.  No new or progressive deficits.  We recommended continuing with course of intensity modulated radiation therapy and concurrent daily Temozolomide.  Radiation will be administered Mon-Fri over 6 weeks, Temodar will be dosed at $Remove'75mg'nQvYNjk$ /m2 to be given daily over 42 days.    Chemotherapy should be held for the following:  ANC less than 1,000  Platelets less than 100,000  LFT or  creatinine greater than 2x ULN  If clinical concerns/contraindications develop  Every 2 weeks during radiation, labs will be checked accompanied by a clinical evaluation in the brain tumor clinic.  We will ask that Cyndia Bent and his wife return to clinic after week 6 of radiation, with labs for evaluation.  All questions were answered. The patient knows to call the clinic with any problems, questions or concerns. No barriers to learning were detected.  I have spent a total of 30 minutes of face-to-face and non-face-to-face time, excluding clinical staff time, preparing to see patient, ordering tests and/or medications, counseling the patient, and independently interpreting results and communicating results to the patient/family/caregiver    Ventura Sellers, MD Medical Director of Neuro-Oncology [facility] at [place] 03/15/21 1:04 PM

## 2021-03-16 ENCOUNTER — Ambulatory Visit
Admission: RE | Admit: 2021-03-16 | Discharge: 2021-03-16 | Disposition: A | Discharge status: Home / Self Care | Payer: Medicaid Other | Source: Ambulatory Visit | Attending: Radiation Oncology | Admitting: Radiation Oncology

## 2021-03-16 ENCOUNTER — Other Ambulatory Visit (HOSPITAL_COMMUNITY): Payer: Self-pay

## 2021-03-16 DIAGNOSIS — C711 Malignant neoplasm of frontal lobe: Secondary | ICD-10-CM | POA: Diagnosis not present

## 2021-03-16 DIAGNOSIS — G939 Disorder of brain, unspecified: Secondary | ICD-10-CM

## 2021-03-16 MED ORDER — SONAFINE EX EMUL: 1.0000 "application " | Freq: Two times a day (BID) | CUTANEOUS | Status: DC

## 2021-03-19 ENCOUNTER — Other Ambulatory Visit: Payer: Self-pay

## 2021-03-19 ENCOUNTER — Other Ambulatory Visit (HOSPITAL_COMMUNITY): Payer: Self-pay

## 2021-03-19 ENCOUNTER — Ambulatory Visit
Admission: RE | Admit: 2021-03-19 | Discharge: 2021-03-19 | Disposition: A | Discharge status: Home / Self Care | Payer: Medicaid Other | Source: Ambulatory Visit | Attending: Radiation Oncology | Admitting: Radiation Oncology

## 2021-03-19 DIAGNOSIS — C711 Malignant neoplasm of frontal lobe: Secondary | ICD-10-CM | POA: Diagnosis not present

## 2021-03-19 DIAGNOSIS — G939 Disorder of brain, unspecified: Secondary | ICD-10-CM | POA: Diagnosis not present

## 2021-03-20 ENCOUNTER — Encounter: Payer: Self-pay | Admitting: *Deleted

## 2021-03-20 ENCOUNTER — Ambulatory Visit
Admission: RE | Admit: 2021-03-20 | Discharge: 2021-03-20 | Disposition: A | Discharge status: Home / Self Care | Payer: Medicaid Other | Source: Ambulatory Visit | Attending: Radiation Oncology | Admitting: Radiation Oncology

## 2021-03-20 ENCOUNTER — Other Ambulatory Visit: Payer: Self-pay

## 2021-03-20 ENCOUNTER — Other Ambulatory Visit (HOSPITAL_COMMUNITY): Payer: Self-pay

## 2021-03-20 DIAGNOSIS — C711 Malignant neoplasm of frontal lobe: Secondary | ICD-10-CM | POA: Diagnosis not present

## 2021-03-20 DIAGNOSIS — G939 Disorder of brain, unspecified: Secondary | ICD-10-CM | POA: Diagnosis not present

## 2021-03-20 MED FILL — Temozolomide Cap 100 MG: ORAL | 14 days supply | Qty: 14 | Fill #1 | Status: AC

## 2021-03-20 MED FILL — Temozolomide Cap 20 MG: ORAL | 14 days supply | Qty: 14 | Fill #1 | Status: AC

## 2021-03-20 MED FILL — Ondansetron HCl Tab 8 MG: ORAL | 15 days supply | Qty: 30 | Fill #0 | Status: AC

## 2021-03-21 ENCOUNTER — Ambulatory Visit
Admission: RE | Admit: 2021-03-21 | Discharge: 2021-03-21 | Disposition: A | Discharge status: Home / Self Care | Payer: Medicaid Other | Source: Ambulatory Visit | Attending: Radiation Oncology | Admitting: Radiation Oncology

## 2021-03-21 ENCOUNTER — Other Ambulatory Visit: Payer: Self-pay

## 2021-03-21 DIAGNOSIS — C711 Malignant neoplasm of frontal lobe: Secondary | ICD-10-CM | POA: Diagnosis not present

## 2021-03-21 DIAGNOSIS — G939 Disorder of brain, unspecified: Secondary | ICD-10-CM | POA: Diagnosis not present

## 2021-03-22 ENCOUNTER — Ambulatory Visit
Admission: RE | Admit: 2021-03-22 | Discharge: 2021-03-22 | Disposition: A | Discharge status: Home / Self Care | Payer: Medicaid Other | Source: Ambulatory Visit | Attending: Radiation Oncology | Admitting: Radiation Oncology

## 2021-03-22 DIAGNOSIS — G939 Disorder of brain, unspecified: Secondary | ICD-10-CM | POA: Diagnosis not present

## 2021-03-22 DIAGNOSIS — C711 Malignant neoplasm of frontal lobe: Secondary | ICD-10-CM | POA: Diagnosis not present

## 2021-03-23 ENCOUNTER — Other Ambulatory Visit: Payer: Self-pay

## 2021-03-23 ENCOUNTER — Ambulatory Visit
Admission: RE | Admit: 2021-03-23 | Discharge: 2021-03-23 | Disposition: A | Discharge status: Home / Self Care | Payer: Medicaid Other | Source: Ambulatory Visit | Attending: Radiation Oncology | Admitting: Radiation Oncology

## 2021-03-23 DIAGNOSIS — G939 Disorder of brain, unspecified: Secondary | ICD-10-CM | POA: Diagnosis not present

## 2021-03-23 DIAGNOSIS — C711 Malignant neoplasm of frontal lobe: Secondary | ICD-10-CM | POA: Diagnosis not present

## 2021-03-26 ENCOUNTER — Other Ambulatory Visit: Payer: Self-pay

## 2021-03-26 ENCOUNTER — Ambulatory Visit
Admission: RE | Admit: 2021-03-26 | Discharge: 2021-03-26 | Disposition: A | Discharge status: Home / Self Care | Payer: Medicaid Other | Source: Ambulatory Visit | Attending: Radiation Oncology | Admitting: Radiation Oncology

## 2021-03-26 DIAGNOSIS — C711 Malignant neoplasm of frontal lobe: Secondary | ICD-10-CM | POA: Diagnosis not present

## 2021-03-26 DIAGNOSIS — G939 Disorder of brain, unspecified: Secondary | ICD-10-CM | POA: Diagnosis not present

## 2021-03-27 ENCOUNTER — Ambulatory Visit
Admission: RE | Admit: 2021-03-27 | Discharge: 2021-03-27 | Disposition: A | Discharge status: Home / Self Care | Payer: Medicaid Other | Source: Ambulatory Visit | Attending: Radiation Oncology | Admitting: Radiation Oncology

## 2021-03-27 ENCOUNTER — Other Ambulatory Visit: Payer: Self-pay

## 2021-03-27 DIAGNOSIS — G939 Disorder of brain, unspecified: Secondary | ICD-10-CM | POA: Diagnosis not present

## 2021-03-27 DIAGNOSIS — C711 Malignant neoplasm of frontal lobe: Secondary | ICD-10-CM | POA: Diagnosis not present

## 2021-03-28 ENCOUNTER — Other Ambulatory Visit: Payer: Self-pay

## 2021-03-28 ENCOUNTER — Ambulatory Visit
Admission: RE | Admit: 2021-03-28 | Discharge: 2021-03-28 | Disposition: A | Discharge status: Home / Self Care | Payer: Medicaid Other | Source: Ambulatory Visit | Attending: Radiation Oncology | Admitting: Radiation Oncology

## 2021-03-28 DIAGNOSIS — C711 Malignant neoplasm of frontal lobe: Secondary | ICD-10-CM | POA: Diagnosis not present

## 2021-03-28 DIAGNOSIS — G939 Disorder of brain, unspecified: Secondary | ICD-10-CM | POA: Diagnosis not present

## 2021-03-29 ENCOUNTER — Other Ambulatory Visit: Payer: Medicaid Other

## 2021-03-29 ENCOUNTER — Inpatient Hospital Stay: Payer: Medicaid Other

## 2021-03-29 ENCOUNTER — Ambulatory Visit: Payer: Medicaid Other | Admitting: Internal Medicine

## 2021-03-29 ENCOUNTER — Inpatient Hospital Stay (HOSPITAL_BASED_OUTPATIENT_CLINIC_OR_DEPARTMENT_OTHER): Payer: Medicaid Other | Admitting: Internal Medicine

## 2021-03-29 ENCOUNTER — Other Ambulatory Visit: Payer: Self-pay

## 2021-03-29 ENCOUNTER — Ambulatory Visit
Admission: RE | Admit: 2021-03-29 | Discharge: 2021-03-29 | Disposition: A | Discharge status: Home / Self Care | Payer: Medicaid Other | Source: Ambulatory Visit | Attending: Radiation Oncology | Admitting: Radiation Oncology

## 2021-03-29 VITALS — BP 111/76 | HR 61 | Temp 97.8°F | Resp 17 | Ht 67.0 in | Wt 144.3 lb

## 2021-03-29 DIAGNOSIS — C719 Malignant neoplasm of brain, unspecified: Secondary | ICD-10-CM | POA: Diagnosis not present

## 2021-03-29 DIAGNOSIS — G939 Disorder of brain, unspecified: Secondary | ICD-10-CM | POA: Diagnosis not present

## 2021-03-29 DIAGNOSIS — C711 Malignant neoplasm of frontal lobe: Secondary | ICD-10-CM | POA: Diagnosis not present

## 2021-03-29 LAB — CBC WITH DIFFERENTIAL (CANCER CENTER ONLY)
Abs Immature Granulocytes: 0.02 10*3/uL (ref 0.00–0.07)
Basophils Absolute: 0.1 10*3/uL (ref 0.0–0.1)
Basophils Relative: 1 %
Eosinophils Absolute: 0.2 10*3/uL (ref 0.0–0.5)
Eosinophils Relative: 2 %
HCT: 43.9 % (ref 39.0–52.0)
Hemoglobin: 14.7 g/dL (ref 13.0–17.0)
Immature Granulocytes: 0 %
Lymphocytes Relative: 20 %
Lymphs Abs: 1.5 10*3/uL (ref 0.7–4.0)
MCH: 32.2 pg (ref 26.0–34.0)
MCHC: 33.5 g/dL (ref 30.0–36.0)
MCV: 96.1 fL (ref 80.0–100.0)
Monocytes Absolute: 0.5 10*3/uL (ref 0.1–1.0)
Monocytes Relative: 6 %
Neutro Abs: 5.6 10*3/uL (ref 1.7–7.7)
Neutrophils Relative %: 71 %
Platelet Count: 191 10*3/uL (ref 150–400)
RBC: 4.57 MIL/uL (ref 4.22–5.81)
RDW: 12.5 % (ref 11.5–15.5)
WBC Count: 7.8 10*3/uL (ref 4.0–10.5)
nRBC: 0 % (ref 0.0–0.2)

## 2021-03-29 LAB — CMP (CANCER CENTER ONLY)
ALT: 14 U/L (ref 0–44)
AST: 15 U/L (ref 15–41)
Albumin: 3.9 g/dL (ref 3.5–5.0)
Alkaline Phosphatase: 66 U/L (ref 38–126)
Anion gap: 10 (ref 5–15)
BUN: 10 mg/dL (ref 6–20)
CO2: 27 mmol/L (ref 22–32)
Calcium: 9.4 mg/dL (ref 8.9–10.3)
Chloride: 108 mmol/L (ref 98–111)
Creatinine: 1 mg/dL (ref 0.61–1.24)
GFR, Estimated: >60 mL/min (ref >60)
Glucose, Bld: 109 mg/dL — ABNORMAL HIGH (ref 70–99)
Potassium: 3.9 mmol/L (ref 3.5–5.1)
Sodium: 145 mmol/L (ref 135–145)
Total Bilirubin: 0.8 mg/dL (ref 0.3–1.2)
Total Protein: 6.7 g/dL (ref 6.5–8.1)

## 2021-03-29 NOTE — Progress Notes (Signed)
[facility] [place], [place] [address] [phone]   Interval Evaluation  Date of Service: 03/29/21 Patient Name: Troy Leon Patient MRN: [id number] Patient DOB: [birth year] Provider: Ventura Sellers, MD  Identifying Statement:  Troy Leon is a 50 y.o. male with right frontal oligodendroglioma grade III    Oncology History  Oligodendroglioma, IDH gene mutant and 1p/19q-codeleted ([place])  01/09/2021 Surgery   Craniotomy, right frontal resection by Dr. Reatha Armour   02/19/2021 - 03/30/2021 Radiation Therapy   IMRT and concurrent Temodar 92m/m2     Biomarkers:  MGMT Unknown.  IDH 1/2 Mutated.  EGFR Unknown  1p/19q co-deleted   Interval History:  Troy UPPERMANpresents today for follow up after completing six weeks of radiation and Temodar.  No issues tolerating chemotherapy.  Continues to experience claustrophobia during treatment, ativan is helpful.  Denies new or progressive neurologic deficits, no seizures or headaches.  Remains active and independent.  H+P (01/26/21) Patient presented to medical attention in February 2022 with several weeks history of new onset headaches.  He describes holo-cranial pain, severe in nature, often associated with AM time period.  In addition, he describes multiple episodes of "sudden dropping, legs getting out, feeling like I'm losing contact", followed by recovery associated with a period of fatigue.  Notably, these episodes stopped following surgery/resection on 01/09/21 (Troy Leon) and initiation of anti-seizure medication.  Currently he feels well, aside from frequent burping and spasming of "esophagus".  Taking 468mof decadron three times per day since discharge.  No functional deficits otherwise.  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  Medications: Current Outpatient Medications on File Prior to Visit  Medication Sig Dispense Refill  . levETIRAcetam (KEPPRA) 500 MG  tablet Take 1 tablet (500 mg total) by mouth 2 (two) times daily. 60 tablet 3  . LORazepam (ATIVAN) 0.5 MG tablet 1 tab po 30 minutes prior to radiation 30 tablet 0  . omeprazole (PRILOSEC) 20 MG capsule Take 1 capsule (20 mg total) by mouth 2 (two) times daily. 60 capsule 3  . ondansetron (ZOFRAN) 8 MG tablet TAKE 1 TABLET BY MOUTH 2 TIMES DAILY AS NEEDED FOR NAUSEA AND VOMITING,MAY TAKE 30-60 MIN PRIOR TO TEMODAR ADMINISTRATION IF NAUSEA/VOMITING OCCURS 30 tablet 1  . temozolomide (TEMODAR) 100 MG capsule TAKE 1 CAPSULE (100 MG TOTAL) BY MOUTH DAILY. MAY TAKE ON AN EMPTY STOMACH TO DECREASE NAUSEA & VOMITING. 42 capsule 0  . temozolomide (TEMODAR) 20 MG capsule TAKE 1 CAPSULE (20 MG TOTAL) BY MOUTH DAILY. MAY TAKE ON AN EMPTY STOMACH TO DECREASE NAUSEA & VOMITING. 42 capsule 0   No current facility-administered medications on file prior to visit.    Allergies:  Allergies  Allergen Reactions  . Penicillins Other (See Comments)    Has patient had a PCN reaction causing immediate rash, facial/tongue/throat swelling, SOB or lightheadedness with hypotension: unknown Has patient had a PCN reaction causing severe rash involving mucus membranes or skin necrosis: unknown Has patient had a PCN reaction that required hospitalization unknown Has patient had a PCN reaction occurring within the last 10 years:no If all of the above answers are "NO", then may proceed wit   Past Medical History:  Past Medical History:  Diagnosis Date  . Olig[place]roglioma of frontal lobe ([place]/20[place]  . Pancreatitis    Past Surgical History:  Past Surgical History:  Procedure Laterality Date  . APPLICATION OF CRANIAL NAVIGATION N/A 01/09/2021  Procedure: APPLICATION OF CRANIAL NAVIGATION;  Surgeon: Troy Leon, Theodoro Doing, DO;  Location: [facility];  Service: Neurosurgery;  Laterality: N/A;  . CHOLECYSTECTOMY    . CRANIOTOMY N/A 01/09/2021   Procedure: CRANIOTOMY FOR TUMOR EXCISION;  Surgeon: Karsten Ro, DO;  Location: [facility];   Service: Neurosurgery;  Laterality: N/A;   Social History:  Social History   Socioeconomic History  . Marital status: Married    Spouse name: Not on file  . Number of children: Not on file  . Years of education: Not on file  . Highest education level: Not on file  Occupational History  . Not on file  Tobacco Use  . Smoking status: Current Every Day Smoker    Packs/day: 0.50    Types: Cigarettes  . Smokeless tobacco: Never Used  Vaping Use  . Vaping Use: Never used  Substance and Sexual Activity  . Alcohol use: No  . Drug use: Yes    Frequency: 2.0 times per week    Types: Marijuana  . Sexual activity: Not on file  Other Topics Concern  . Not on file  Social History Narrative  . Not on file   Social Determinants of Health   Financial Resource Strain: Not on file  Food Insecurity: Not on file  Transportation Needs: Not on file  Physical Activity: Not on file  Stress: Not on file  Social Connections: Not on file  Intimate Partner Violence: Not on file   Family History:  Family History  Problem Relation Age of Onset  . Diabetes Mother   . Diabetes Other   . Heart attack Other   . Hypertension Other     Review of Systems: Constitutional: Doesn't report fevers, chills or abnormal weight loss Eyes: Doesn't report blurriness of vision Ears, nose, mouth, throat, and face: Doesn't report sore throat Respiratory: Doesn't report cough, dyspnea or wheezes Cardiovascular: Doesn't report palpitation, chest discomfort  Gastrointestinal:  Doesn't report nausea, constipation, diarrhea GU: Doesn't report incontinence Skin: Doesn't report skin rashes Neurological: Per HPI Musculoskeletal: Doesn't report joint pain Behavioral/Psych: Doesn't report anxiety  Physical Exam: Vitals:   03/29/21 1456  BP: 111/76  Pulse: 61  Resp: 17  Temp: 97.8 F (36.6 C)  SpO2: 100%   KPS: 90. General: Alert, cooperative, pleasant, in no acute distress Head: Craniotomy scar w/  staples EENT: No conjunctival injection or scleral icterus.  Lungs: Resp effort normal Cardiac: Regular rate Abdomen: Non-distended abdomen Skin: No rashes cyanosis or petechiae. Extremities: No clubbing or edema  Neurologic Exam: Mental Status: Awake, alert, attentive to examiner. Oriented to self and environment. Language is fluent with intact comprehension.  Cranial Nerves: Visual acuity is grossly normal. Visual fields are full. Extra-ocular movements intact. No ptosis. Face is symmetric Motor: Tone and bulk are normal. Power is full in both arms and legs. Reflexes are symmetric, no pathologic reflexes present.  Sensory: Intact to light touch Gait: Normal.   Labs: I have reviewed the data as listed    Component Value Date/Time   NA 146 (H) 03/15/2021 1235   NA 141 01/01/2021 0939   K 3.9 03/15/2021 1235   CL 108 03/15/2021 1235   CO2 28 03/15/2021 1235   GLUCOSE 100 (H) 03/15/2021 1235   BUN 11 03/15/2021 1235   BUN 12 01/01/2021 0939   CREATININE 1.08 03/15/2021 1235   CREATININE 0.80 12/16/2016 1603   CALCIUM 9.3 03/15/2021 1235   PROT 6.6 03/15/2021 1235   ALBUMIN 3.9 03/15/2021 1235   AST 18  03/15/2021 1235   ALT 15 03/15/2021 1235   ALKPHOS 70 03/15/2021 1235   BILITOT 0.9 03/15/2021 1235   GFRNONAA >60 03/15/2021 1235   GFRAA 107 01/01/2021 0939   Lab Results  Component Value Date   WBC 7.8 03/15/2021   NEUTROABS 5.0 03/15/2021   HGB 14.9 03/15/2021   HCT 45.0 03/15/2021   MCV 97.4 03/15/2021   PLT 221 03/15/2021    Assessment/Plan Oligodendroglioma, IDH gene mutant and 1p/19q-codeleted ([place]) [C71.9]  Troy Leon is clinically stable today, now having completed 6 weeks of IMRT and concurrent Temodar.  No new or progressive deficits.  We recommended completing course of intensity modulated radiation therapy and concurrent daily Temozolomide.  Radiation will be administered Mon-Fri over 6 weeks, Temodar will be dosed at 46m/m2 to be given daily over  42 days.    Chemotherapy should be held for the following:  ANC less than 1,000  Platelets less than 100,000  LFT or creatinine greater than 2x ULN  If clinical concerns/contraindications develop  We ask that RCyndia Bentreturn to clinic in 1 months following post-RT brain MRI, or sooner as needed.  All questions were answered. The patient knows to call the clinic with any problems, questions or concerns. No barriers to learning were detected.  I have spent a total of 30 minutes of face-to-face and non-face-to-face time, excluding clinical staff time, preparing to see patient, ordering tests and/or medications, counseling the patient, and independently interpreting results and communicating results to the patient/family/caregiver    ZVentura Sellers MD Medical Director of Neuro-Oncology [facility]at WT[facility]onto05/19/22 2:46 PM

## 2021-03-30 ENCOUNTER — Other Ambulatory Visit: Payer: Self-pay

## 2021-03-30 ENCOUNTER — Ambulatory Visit
Admission: RE | Admit: 2021-03-30 | Discharge: 2021-03-30 | Disposition: A | Discharge status: Home / Self Care | Payer: Medicaid Other | Source: Ambulatory Visit | Attending: Radiation Oncology | Admitting: Radiation Oncology

## 2021-03-30 ENCOUNTER — Encounter: Payer: Self-pay | Admitting: Radiation Oncology

## 2021-03-30 DIAGNOSIS — G939 Disorder of brain, unspecified: Secondary | ICD-10-CM | POA: Diagnosis not present

## 2021-03-30 DIAGNOSIS — C711 Malignant neoplasm of frontal lobe: Secondary | ICD-10-CM | POA: Diagnosis not present

## 2021-04-02 ENCOUNTER — Encounter: Payer: Self-pay | Admitting: Internal Medicine

## 2021-04-02 NOTE — Progress Notes (Signed)
  Patient Name: Troy Leon MRN: [id number] DOB: March 07, 1971 Referring Physician: Sallee Lange (Profile Not Attached) Date of Service: 03/30/2021 [facility] [place], [place]                                                        End Of Treatment Note  Diagnoses: C71.1-Malignant neoplasm of frontal lobe  Cancer Staging:  Grade 3 Oligodendroglioma IDH mutant and 1P/19 Q codeleted  Intent: Curative  Radiation Treatment Dates: 02/19/2021 through 03/30/2021 Site Technique Total Dose (Gy) Dose per Fx (Gy) Completed Fx Beam Energies  Brain: Brain IMRT 46/46 2 23/23 6X  Brain: Brain_Bst IMRT 14/14 2 7/7 6X   Narrative: The patient tolerated radiation therapy relatively well. He did note changes of the skin of the scalp during treatment including itching, and discomfort in the forehead region.  Plan: The patient will receive a call in about one month from the radiation oncology department. He will continue follow up with Dr. Mickeal Skinner as well.   ________________________________________________    Carola Rhine, [facility]

## 2021-04-11 ENCOUNTER — Telehealth: Payer: Self-pay | Admitting: *Deleted

## 2021-04-11 NOTE — Telephone Encounter (Signed)
Troy Leon named on FMLA/Disability tracking list reads anonymous form received for Dr. Lisbeth Renshaw on 03/23/2021 was completed by nurse and signed by provider 03/20/2021.    Connected with Dr. Ida Rogue nurse.  "Unaware of form.  There is an "Initial Disability Claim Form - Physician Statement" in [employer].  Tax ID number is not radiation [id number]."  Connected with Troy Leon [phone]) as tax ID is not Med/Onc Tax ID: [id number]. "I've e-mailed Kim about the [facility] form.  Will pick up what she says she completed sometime later in the week.  Will complete my sections to return completed form to [facility]."  Advised provider and nurse Maudie Mercury are out of office today.  Sending this message to notify nurse of need for form.

## 2021-04-13 ENCOUNTER — Telehealth: Payer: Self-pay | Admitting: *Deleted

## 2021-04-13 NOTE — Telephone Encounter (Signed)
Received vm message from [facility]. She states that pt is having his MRI @ [facility] on 04/16/21 @ 4pm. She wanted Dr. Mickeal Skinner to know

## 2021-04-16 ENCOUNTER — Ambulatory Visit (HOSPITAL_COMMUNITY): Payer: Medicaid Other

## 2021-04-17 ENCOUNTER — Other Ambulatory Visit: Payer: Self-pay | Admitting: Radiation Therapy

## 2021-04-19 ENCOUNTER — Other Ambulatory Visit: Payer: Self-pay

## 2021-04-19 ENCOUNTER — Ambulatory Visit (HOSPITAL_COMMUNITY)
Admission: RE | Admit: 2021-04-19 | Discharge: 2021-04-19 | Disposition: A | Discharge status: Home / Self Care | Payer: Medicaid Other | Source: Ambulatory Visit | Attending: Internal Medicine | Admitting: Internal Medicine

## 2021-04-19 DIAGNOSIS — C719 Malignant neoplasm of brain, unspecified: Secondary | ICD-10-CM

## 2021-04-19 DIAGNOSIS — R220 Localized swelling, mass and lump, head: Secondary | ICD-10-CM | POA: Diagnosis not present

## 2021-04-19 DIAGNOSIS — C711 Malignant neoplasm of frontal lobe: Secondary | ICD-10-CM | POA: Diagnosis not present

## 2021-04-19 IMAGING — MR MR HEAD WO/W CM
13 of 15 series · 30 of 48 positions shown · IV contrast (6.5 ml Gadavist)
Comparison: [DATE].  [DATE].  [DATE].

CLINICAL DATA: Right frontal oligodendroglioma status post
craniotomy and radiation treatment. Assess treatment response.

EXAM:
MRI HEAD WITHOUT AND WITH CONTRAST
TECHNIQUE: Multiplanar, multiecho pulse sequences of the brain and surrounding
structures were obtained without and with intravenous contrast.
CONTRAST:  6.5mL GADAVIST GADOBUTROL 1 MMOL/ML IV SOLN

[Series 5: DWI · axial · 3.0mm · 0.80mm/px · z∈[-68,+75]mm · 4 of 50 slices shown (1 of 4)]
[im 1/50]
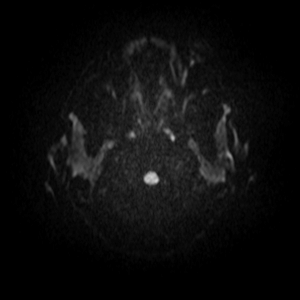
[im 17/50]
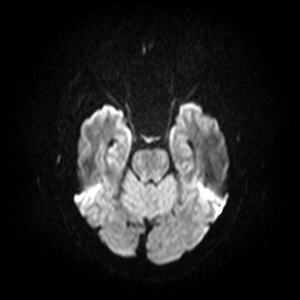
[im 33/50]
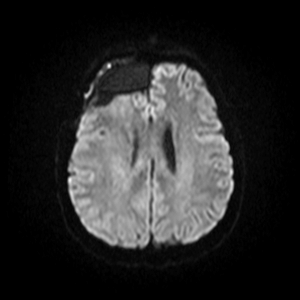
[im 50/50]
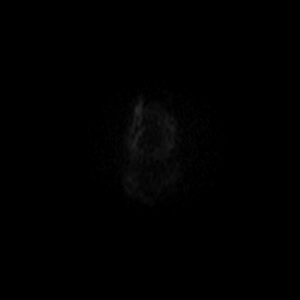

[Series 6: DWI · axial · 3.0mm · 0.80mm/px · z∈[-68,+75]mm · 3 of 50 slices shown (2 of 4)]
[im 1/50]
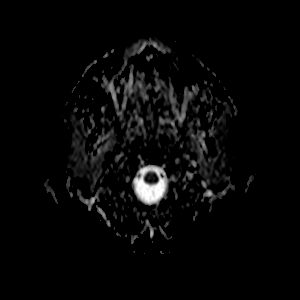
[im 25/50]
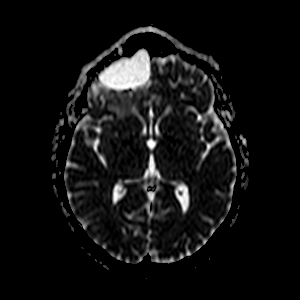
[im 50/50]
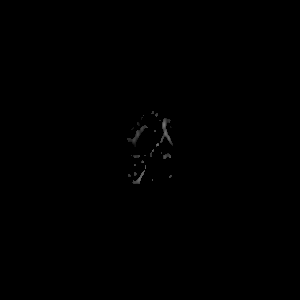

[Series 7: DWI · coronal · 5.0mm · 0.88mm/px · 2 of 32 slices shown (3 of 4)]
[im 1/32]
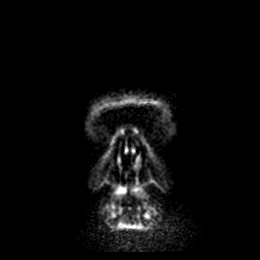
[im 32/32]
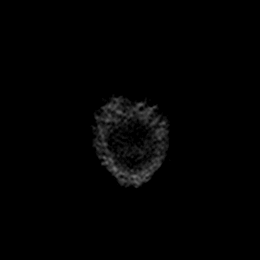

[Series 8: DWI · coronal · 5.0mm · 0.88mm/px · 2 of 32 slices shown (4 of 4)]
[im 1/32]
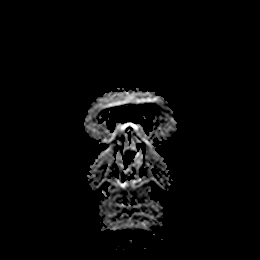
[im 32/32]
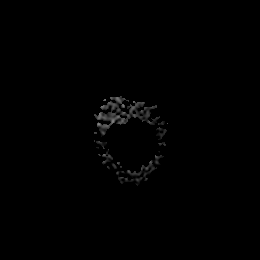

[Series 9: T1 · sagittal · 5.0mm · 0.75mm/px · 1 of 21 slices shown]
[im 1/21]
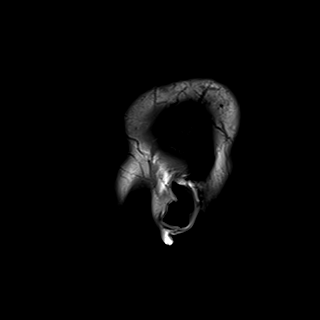

[Series 10: T2 · axial · 5.0mm · 0.75mm/px · 1 of 22 slices shown]
[im 1/22]
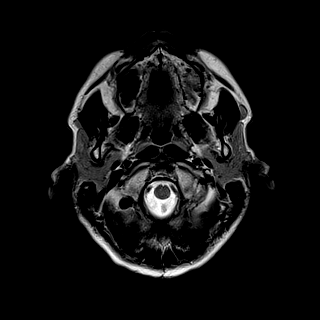

[Series 11: mag_images · axial · 3.0mm · 0.94mm/px · z∈[-72,+77]mm · 3 of 52 slices shown]
[im 1/52]
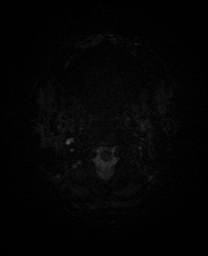
[im 26/52]
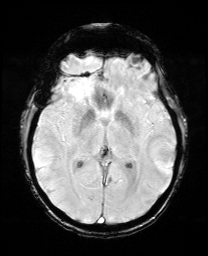
[im 52/52]
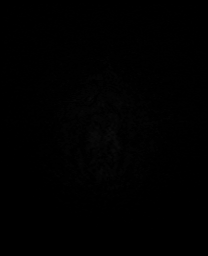

[Series 12: pha_images · axial · 3.0mm · 0.94mm/px · z∈[-72,+74]mm · 3 of 51 slices shown]
[im 1/51]
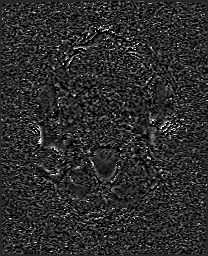
[im 26/51]
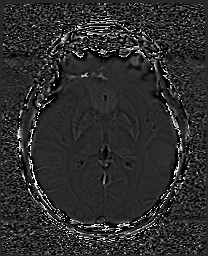
[im 51/51]
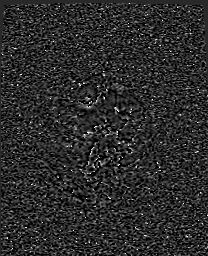

[Series 13: swi_images · axial · 3.0mm · 0.94mm/px · z∈[-72,+77]mm · 3 of 52 slices shown]
[im 1/52]
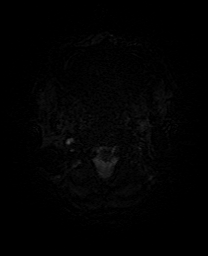
[im 26/52]
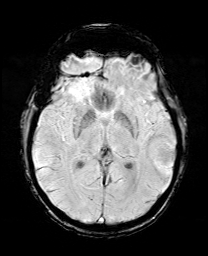
[im 52/52]
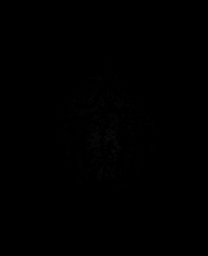

[Series 15: FLAIR · axial · 3.0mm · 0.47mm/px · z∈[-69,+74]mm · 3 of 50 slices shown]
[im 1/50]
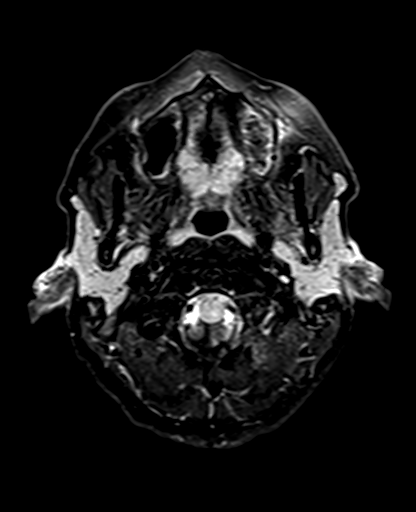
[im 25/50]
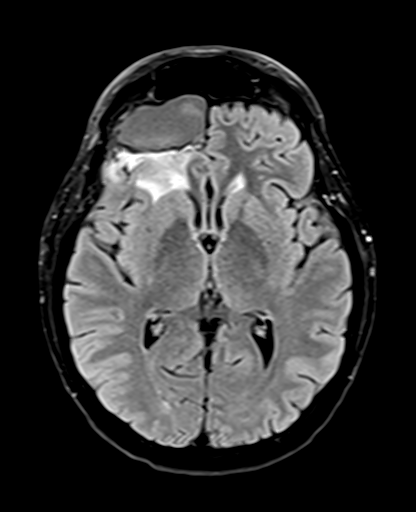
[im 50/50]
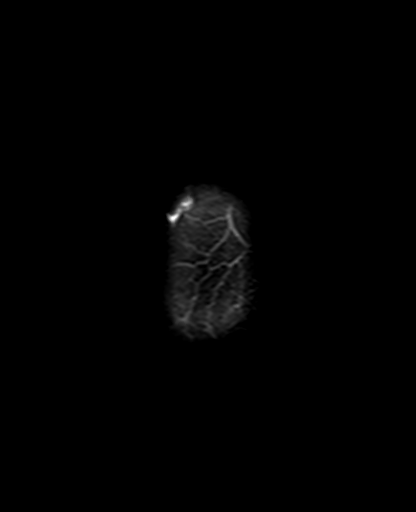

[Series 17: T2 post-contrast · coronal · 5.0mm · 0.72mm/px · 2 of 34 slices shown]
[im 1/34]
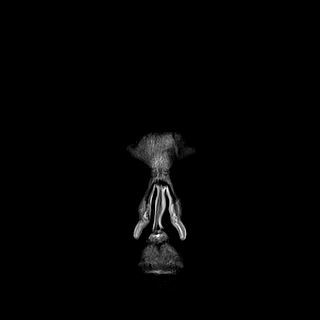
[im 34/34]
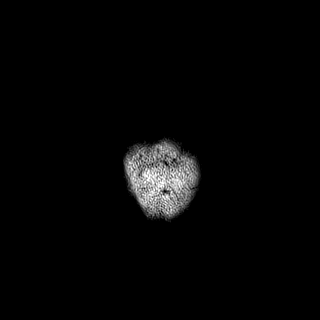

[Series 19: T1 post-contrast · coronal · 5.0mm · 0.34mm/px · 2 of 30 slices shown (1 of 2)]
[im 1/30]
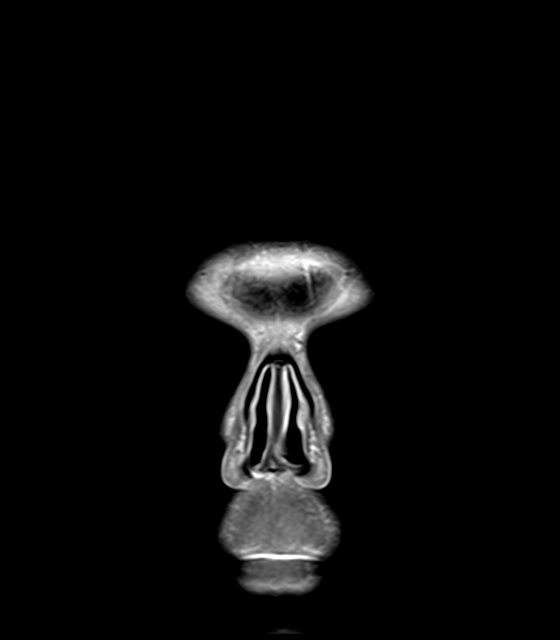
[im 30/30]
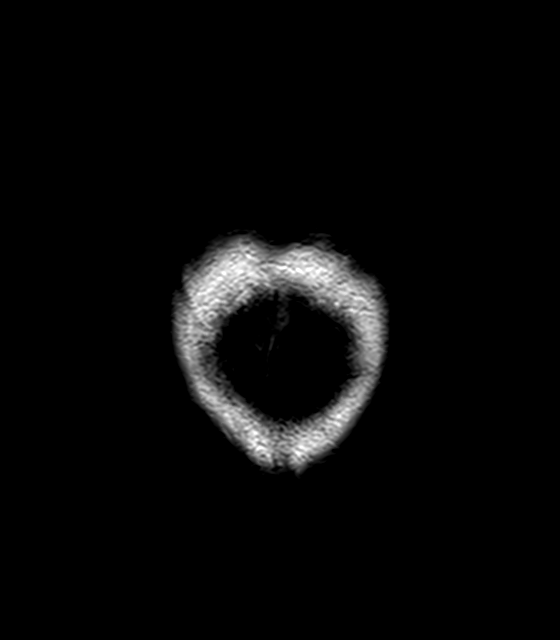

[Series 20: T1 post-contrast · sagittal · 5.0mm · 0.75mm/px · 1 of 21 slices shown (2 of 2)]
[im 1/21]
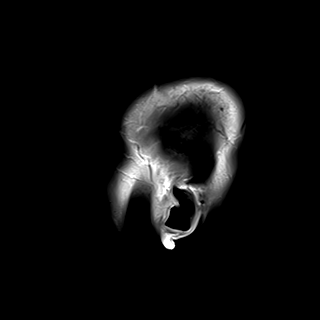

[30 of 48 positions shown; findings below may reference images not displayed]

FINDINGS: Brain: Previous right frontal craniotomy for resection of a large
cystic and solid right frontal intra-axial mass. Post resection
space appears to contain uncomplicated fluid. There is no sign of
residual enhancing tumor in the right frontal lobe. FLAIR and T2
bright signal within the adjacent posterior frontal white matter,
with slight mass effect and extension into the right genu of the
corpus callosum, could represent nonenhancing infiltrating tumor.
Follow-up on subsequent exams obviously warranted. The remainder the
brain appears normal. Left hemispheres normal. No sign of ischemic
disease. Brainstem and cerebellum are normal. No subdural
collection.

Vascular: Major vessels at the base of the brain show flow.

Skull and upper cervical spine: Otherwise negative

Sinuses/Orbits: Clear/normal

Other: None
IMPRESSION: Resection of all enhancing components of the previously seen large
right frontal mass. No abnormal enhancing tissue demonstrable
presently. Post resection space filled with uncomplicated appearing
fluid. Abnormal T2 and FLAIR signal within the right frontal brain
posterior to the resection with some extension into the corpus
callosum could represent nonenhancing tumor and subsequent follow-up
is obviously warranted.

## 2021-04-19 MED ORDER — GADOBUTROL 1 MMOL/ML IV SOLN
6.5000 mL | Freq: Once | INTRAVENOUS | Status: AC | PRN
  Administered 2021-04-19: 16:00:00 6.5 mL via INTRAVENOUS

## 2021-04-24 ENCOUNTER — Other Ambulatory Visit: Payer: Self-pay

## 2021-04-24 ENCOUNTER — Inpatient Hospital Stay: Payer: Medicaid Other | Attending: Radiation Oncology | Admitting: Internal Medicine

## 2021-04-24 ENCOUNTER — Other Ambulatory Visit (HOSPITAL_COMMUNITY): Payer: Self-pay

## 2021-04-24 ENCOUNTER — Telehealth: Payer: Self-pay | Admitting: Pharmacist

## 2021-04-24 VITALS — BP 100/62 | HR 60 | Temp 97.2°F | Resp 18 | Ht 67.0 in | Wt 141.0 lb

## 2021-04-24 DIAGNOSIS — Z923 Personal history of irradiation: Secondary | ICD-10-CM | POA: Diagnosis not present

## 2021-04-24 DIAGNOSIS — C711 Malignant neoplasm of frontal lobe: Secondary | ICD-10-CM | POA: Insufficient documentation

## 2021-04-24 DIAGNOSIS — Z79899 Other long term (current) drug therapy: Secondary | ICD-10-CM | POA: Insufficient documentation

## 2021-04-24 DIAGNOSIS — C719 Malignant neoplasm of brain, unspecified: Secondary | ICD-10-CM | POA: Diagnosis not present

## 2021-04-24 DIAGNOSIS — F17210 Nicotine dependence, cigarettes, uncomplicated: Secondary | ICD-10-CM | POA: Insufficient documentation

## 2021-04-24 MED ORDER — TEMOZOLOMIDE 140 MG PO CAPS
280.0000 mg | ORAL_CAPSULE | Freq: Every day | ORAL | 0 refills | Status: DC
  Filled 2021-04-24: qty 14, 7d supply, fill #0
  Filled 2021-04-25: qty 10, 28d supply, fill #0

## 2021-04-24 NOTE — Progress Notes (Signed)
[facility] [place], [place] [address] [phone]   Interval Evaluation  Date of Service: 04/24/21 Patient Name: Troy Leon Patient MRN: [id number] Patient DOB: 14-Oct-1971 Provider: Ventura Sellers, MD  Identifying Statement:  [name] is a 50 y.o. male with right frontal  oligodendroglioma grade III     Oncology History  Oligodendroglioma, IDH gene mutant and 1p/19q-codeleted ([place])  01/09/2021 Surgery   Craniotomy, right frontal resection by Dr. Reatha Armour   02/19/2021 - 03/30/2021 Radiation Therapy   IMRT and concurrent Temodar 58m/m2     Biomarkers:  MGMT Unknown.  IDH 1/2 Mutated.  EGFR Unknown  1p/19q co-deleted   Interval History:  RDELLAS GUARDpresents today for follow up, now having completed radiation and chemotherapy.  He has enjoyed his time away from treatment.  Denies new or progressive neurologic deficits, no seizures or headaches.  Remains active and independent.  H+P (01/26/21) Patient presented to medical attention in February 2022 with several weeks history of new onset headaches.  He describes holo-cranial pain, severe in nature, often associated with AM time period.  In addition, he describes multiple episodes of "sudden dropping, legs getting out, feeling like I'm losing contact", followed by recovery associated with a period of fatigue.  Notably, these episodes stopped following surgery/resection on 01/09/21 ([name]) and initiation of anti-seizure medication.  Currently he feels well, aside from frequent burping and spasming of "esophagus".  Taking 470mof decadron three times per day since discharge.  No functional deficits otherwise.  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  Medications: Current Outpatient Medications on File Prior to Visit  Medication Sig Dispense Refill   levETIRAcetam (KEPPRA) 500 MG tablet Take 1 tablet (500 mg total) by mouth 2 (two) times daily. 60  tablet 3   LORazepam (ATIVAN) 0.5 MG tablet 1 tab po 30 minutes prior to radiation 30 tablet 0   omeprazole (PRILOSEC) 20 MG capsule Take 1 capsule (20 mg total) by mouth 2 (two) times daily. 60 capsule 3   ondansetron (ZOFRAN) 8 MG tablet TAKE 1 TABLET BY MOUTH 2 TIMES DAILY AS NEEDED FOR NAUSEA AND VOMITING,MAY TAKE 30-60 MIN PRIOR TO TEMODAR ADMINISTRATION IF NAUSEA/VOMITING OCCURS 30 tablet 1   temozolomide (TEMODAR) 100 MG capsule TAKE 1 CAPSULE (100 MG TOTAL) BY MOUTH DAILY. MAY TAKE ON AN EMPTY STOMACH TO DECREASE NAUSEA & VOMITING. 42 capsule 0   temozolomide (TEMODAR) 20 MG capsule TAKE 1 CAPSULE (20 MG TOTAL) BY MOUTH DAILY. MAY TAKE ON AN EMPTY STOMACH TO DECREASE NAUSEA & VOMITING. 42 capsule 0   No current facility-administered medications on file prior to visit.    Allergies:  Allergies  Allergen Reactions   Penicillins Other (See Comments)    Has patient had a PCN reaction causing immediate rash, facial/tongue/throat swelling, SOB or lightheadedness with hypotension: unknown Has patient had a PCN reaction causing severe rash involving mucus membranes or skin necrosis: unknown Has patient had a PCN reaction that required hospitalization unknown Has patient had a PCN reaction occurring within the last 10 years:no If all of the above answers are "NO", then may proceed wit   Past Medical History:  Past Medical History:  Diagnosis Date   [facility]   Pancreatitis    Past Surgical History:  Past Surgical History:  Procedure Laterality Date   APPLICATION OF CRANIAL NAVIGATION N/A 01/09/2021   Procedure: APPLICATION OF CRANIAL NAVIGATION;  Surgeon: Karsten Ro, DO;  Location: [facility];  Service: Neurosurgery;  Laterality: N/A;   CHOLECYSTECTOMY     CRANIOTOMY N/A 01/09/2021   Procedure: CRANIOTOMY FOR TUMOR EXCISION;  Surgeon: Karsten Ro, DO;  Location: [facility];  Service: Neurosurgery;  Laterality: N/A;   Social History:  Social History    Socioeconomic History   Marital status: Married    Spouse name: Not on file   Number of children: Not on file   Years of education: Not on file   Highest education level: Not on file  Occupational History   Not on file  Tobacco Use   Smoking status: Every Day    Packs/day: 0.50    Pack years: 0.00    Types: Cigarettes   Smokeless tobacco: Never  Vaping Use   Vaping Use: Never used  Substance and Sexual Activity   Alcohol use: No   Drug use: Yes    Frequency: 2.0 times per week    Types: Marijuana   Sexual activity: Not on file  Other Topics Concern   Not on file  Social History Narrative   Not on file   Social Determinants of Health   Financial Resource Strain: Not on file  Food Insecurity: Not on file  Transportation Needs: Not on file  Physical Activity: Not on file  Stress: Not on file  Social Connections: Not on file  Intimate Partner Violence: Not on file   Family History:  Family History  Problem Relation Age of Onset   Diabetes Mother    Diabetes Other    Heart attack Other    Hypertension Other     Review of Systems: Constitutional: Doesn't report fevers, chills or abnormal weight loss Eyes: Doesn't report blurriness of vision Ears, nose, mouth, throat, and face: Doesn't report sore throat Respiratory: Doesn't report cough, dyspnea or wheezes Cardiovascular: Doesn't report palpitation, chest discomfort  Gastrointestinal:  Doesn't report nausea, constipation, diarrhea GU: Doesn't report incontinence Skin: Doesn't report skin rashes Neurological: Per HPI Musculoskeletal: Doesn't report joint pain Behavioral/Psych: Doesn't report anxiety  Physical Exam: Vitals:   04/24/21 1225  BP: 100/62  Pulse: 60  Resp: 18  Temp: (!) 97.2 F (36.2 C)  SpO2: 99%   KPS: 90. General: Alert, cooperative, pleasant, in no acute distress Head: Craniotomy scar w/ staples EENT: No conjunctival injection or scleral icterus.  Lungs: Resp effort  normal Cardiac: Regular rate Abdomen: Non-distended abdomen Skin: No rashes cyanosis or petechiae. Extremities: No clubbing or edema  Neurologic Exam: Mental Status: Awake, alert, attentive to examiner. Oriented to self and environment. Language is fluent with intact comprehension.  Cranial Nerves: Visual acuity is grossly normal. Visual fields are full. Extra-ocular movements intact. No ptosis. Face is symmetric Motor: Tone and bulk are normal. Power is full in both arms and legs. Reflexes are symmetric, no pathologic reflexes present.  Sensory: Intact to light touch Gait: Normal.   Labs: I have reviewed the data as listed    Component Value Date/Time   NA 145 03/29/2021 1427   NA 141 01/01/2021 0939   K 3.9 03/29/2021 1427   CL 108 03/29/2021 1427   CO2 27 03/29/2021 1427   GLUCOSE 109 (H) 03/29/2021 1427   BUN 10 03/29/2021 1427   BUN 12 01/01/2021 0939   CREATININE 1.00 03/29/2021 1427   CREATININE 0.80 12/16/2016 1603   CALCIUM 9.4 03/29/2021 1427   PROT 6.7 03/29/2021 1427   ALBUMIN 3.9 03/29/2021 1427   AST 15 03/29/2021 1427  ALT 14 03/29/2021 1427   ALKPHOS 66 03/29/2021 1427   BILITOT 0.8 03/29/2021 1427   GFRNONAA >60 03/29/2021 1427   GFRAA 107 01/01/2021 0939   Lab Results  Component Value Date   WBC 7.8 03/29/2021   NEUTROABS 5.6 03/29/2021   HGB 14.7 03/29/2021   HCT 43.9 03/29/2021   MCV 96.1 03/29/2021   PLT 191 03/29/2021   Imaging:  [facility] Clinician Interpretation: I have personally reviewed the CNS images as listed.  My interpretation, in the context of the patient's clinical presentation, is stable disease  MR BRAIN W WO CONTRAST  Result Date: 04/20/2021 CLINICAL DATA:  Right frontal oligodendroglioma status post craniotomy and radiation treatment. Assess treatment response. EXAM: MRI HEAD WITHOUT AND WITH CONTRAST TECHNIQUE: Multiplanar, multiecho pulse sequences of the brain and surrounding structures were obtained without and with  intravenous contrast. CONTRAST:  6.33m GADAVIST GADOBUTROL 1 MMOL/ML IV SOLN COMPARISON:  01/10/2021.  01/07/2021.  01/04/2021. FINDINGS: Brain: Previous right frontal craniotomy for resection of a large cystic and solid right frontal intra-axial mass. Post resection space appears to contain uncomplicated fluid. There is no sign of residual enhancing tumor in the right frontal lobe. FLAIR and T2 bright signal within the adjacent posterior frontal white matter, with slight mass effect and extension into the right genu of the corpus callosum, could represent nonenhancing infiltrating tumor. Follow-up on subsequent exams obviously warranted. The remainder the brain appears normal. Left hemispheres normal. No sign of ischemic disease. Brainstem and cerebellum are normal. No subdural collection. Vascular: Major vessels at the base of the brain show flow. Skull and upper cervical spine: Otherwise negative Sinuses/Orbits: Clear/normal Other: None IMPRESSION: Resection of all enhancing components of the previously seen large right frontal mass. No abnormal enhancing tissue demonstrable presently. Post resection space filled with uncomplicated appearing fluid. Abnormal T2 and FLAIR signal within the right frontal brain posterior to the resection with some extension into the corpus callosum could represent nonenhancing tumor and subsequent follow-up is obviously warranted. Electronically Signed   By: MNelson ChimesM.D.   On: 04/20/2021 12:51     Assessment/Plan Oligodendroglioma, IDH gene mutant and 1p/19q-codeleted ([place] [C71.9]  RVOLLIE BRUNTYis clinically and radiographically stable today, now having completed IMRT and concurrent Temodar.  No new or progressive deficits.  We recommended initiating treatment with cycle #1 Temozolomide 150 mg/m2, on for five days and off for twenty three days in twenty eight day cycles. The patient will have a complete blood count performed on days 21 and 28 of each cycle, and a  comprehensive metabolic panel performed on day 28 of each cycle. Labs may need to be performed more often. Zofran will prescribed for home use for nausea/vomiting.   Informed consent was obtained verbally at bedside to proceed with oral chemotherapy.  Chemotherapy should be held for the following:  ANC less than 1,000  Platelets less than 100,000  LFT or creatinine greater than 2x ULN  If clinical concerns/contraindications develop  We ask that RCyndia Bentreturn to clinic in 1 month with labs for evaluation prior to cycle #2, or sooner as needed.  All questions were answered. The patient knows to call the clinic with any problems, questions or concerns. No barriers to learning were detected.  I have spent a total of 40 minutes of face-to-face and non-face-to-face time, excluding clinical staff time, preparing to see patient, ordering tests and/or medications, counseling the patient, and independently interpreting results and communicating results to the patient/family/caregiver  Ventura Sellers, MD Medical Director of Neuro-Oncology [facility] 04/24/21 12:31 PM

## 2021-04-24 NOTE — Telephone Encounter (Signed)
Oral Oncology Pharmacist Encounter  Received new prescription for Temodar (temozolomide) for the maintenance treatment of grade III oligodendroglioma, IDH mutant, 1p/19q-codeleted, planned duration tenatively ~6-12 months  Prescription dose and frequency assessed for appropriateness.  CBC w/ Diff and CMP from 03/29/21 assessed, labs overall stable, no dose adjustments required.  Current medication list in Epic reviewed, no relevant/significant DDIs with Temodar identified.  Evaluated chart and no patient barriers to medication adherence noted.   Patient agreement for treatment documented in MD note on 04/24/21.  Prescription has been e-scribed to the [facility] for benefits analysis and approval.  Oral Oncology Clinic will continue to follow for insurance authorization, copayment issues, initial counseling and start date.  Leron Croak, PharmD, BCPS Hematology/Oncology Clinical Pharmacist [facility] [phone] 04/24/2021 3:54 PM

## 2021-04-25 ENCOUNTER — Other Ambulatory Visit (HOSPITAL_COMMUNITY): Payer: Self-pay

## 2021-04-25 NOTE — Telephone Encounter (Signed)
Oral Chemotherapy Pharmacist Encounter  I spoke with patient for overview of: Temodar (temozolomide) for the maintenance treatment of grade III oligodendroglioma, IDH mutant, 1p/19q-codeleted, planned duration tenatively ~6-12 months  Counseled on administration, dosing, side effects, monitoring, drug-food interactions, safe handling, storage, and disposal.  Patient will take Temodar 140 mg capsules, 2 capsules (280 mg total daily dose), by mouth once daily, may take at bedtime and on an empty stomach to decrease nausea and vomiting.  If 1st cycle is well tolerated, Troy Leon informed that Temodar dose may be increased to 200 mg/m2 daily for 5 days on, 23 days off, repeated every 28 days for subsequent cycles   Patient will take Temodar daily for 5 days on, 23 days off, and repeated.  Temodar start date: 04/27/21   Patient will take Zofran 22m tablet, 1 tablet by mouth 30-60 min prior to Temodar dose to help decrease N/V.   Adverse effects include but are not limited to: nausea, vomiting, anorexia, GI upset, rash, drug fever, and fatigue. Rare but serious adverse effects of pneumocystis pneumonia and secondary malignancy also discussed.  We discussed strategies to manage constipation if they occur secondary to ondansetron dosing.  PCP prophylaxis will not be initiated at this time, but may be added based on lymphocyte count in the future.  Reviewed importance of keeping a medication schedule and plan for any missed doses. No barriers to medication adherence identified.  Medication reconciliation performed and medication/allergy list updated.  Insurance authorization for Temodar has been obtained. Test claim at the pharmacy revealed copayment $3 for 1st fill of Temodar. [facility] to deliver to patient's home on 04/26/21.  Patient informed the pharmacy will reach out 5-7 days prior to needing next fill of Temodar to coordinate  continued medication acquisition to prevent break in therapy.  All questions answered.  Mr. BCheatumvoiced understanding and appreciation.   Medication education handout placed in mail for patient. Patient knows to call the office with questions or concerns. Oral Chemotherapy Clinic phone number provided to patient.   RLeron Croak PharmD, BCPS Hematology/Oncology Clinical Pharmacist [phone]/15/2022 10:40 AM

## 2021-04-26 ENCOUNTER — Other Ambulatory Visit (HOSPITAL_COMMUNITY): Payer: Self-pay

## 2021-04-26 DIAGNOSIS — Z029 Encounter for administrative examinations, unspecified: Secondary | ICD-10-CM

## 2021-04-27 ENCOUNTER — Other Ambulatory Visit (HOSPITAL_COMMUNITY): Payer: Self-pay

## 2021-05-10 ENCOUNTER — Other Ambulatory Visit: Payer: Self-pay

## 2021-05-10 ENCOUNTER — Ambulatory Visit: Payer: Medicaid Other | Admitting: Family Medicine

## 2021-05-10 ENCOUNTER — Encounter: Payer: Self-pay | Admitting: Family Medicine

## 2021-05-10 VITALS — BP 106/62 | HR 47 | Temp 97.7°F | Ht 67.0 in | Wt 140.0 lb

## 2021-05-10 DIAGNOSIS — C719 Malignant neoplasm of brain, unspecified: Secondary | ICD-10-CM | POA: Diagnosis not present

## 2021-05-10 DIAGNOSIS — R634 Abnormal weight loss: Secondary | ICD-10-CM | POA: Diagnosis not present

## 2021-05-10 NOTE — Patient Instructions (Signed)
Please keep [place] updated through [facility]. I am glad you are doing so well. Your exam today was good. Continue to eat healthy and keep your weight up. We will do a follow-up later this year Let us know if we can be of any help TakeCare-Dr. Nicki Reaper

## 2021-05-10 NOTE — Progress Notes (Signed)
   Subjective:    Patient ID: Troy Leon, male    DOB: Feb 06, 1971, 50 y.o.   MRN: [id number]  HPI Well check follow up  Patient presents follow-up.  He had brain surgery for cancer earlier this year Underwent radiation Currently undergoing chemotherapy He is trying to do the best he can and keeping his weight up but it has come down some currently at 140 pounds.  Appetite fairly good.  Denies being depressed.  Review of Systems     Objective:   Physical Exam Lungs are clear heart is regular pulse normal BP good       Assessment & Plan:  Weight loss related to radiation chemotherapy and concurrent situation patient is holding up in regards to mental health with this.  We did encourage him strongly to stay physically active if he improves to the point of returning to work I would recommend part-time but currently he is not working understandably so  Brain tumor under the care of specialist

## 2021-05-18 ENCOUNTER — Telehealth: Payer: Self-pay | Admitting: *Deleted

## 2021-05-18 ENCOUNTER — Other Ambulatory Visit (HOSPITAL_COMMUNITY): Payer: Self-pay

## 2021-05-18 NOTE — Telephone Encounter (Signed)
Received vm message from patient asking when he is to start back on his chemotherapy.  Please advise

## 2021-05-18 NOTE — Telephone Encounter (Addendum)
TCT patient . No answer but was able to leave vm message for this patient to advise that any chemotherapy resumption will be discussed with him in person at his next visit with Dr. Mickeal Skinner on 05/23/21. Advised of appts times. Advised he could call back next week if he had any other questions but these issues will be talked about at his appt.

## 2021-05-19 ENCOUNTER — Emergency Department (HOSPITAL_COMMUNITY)
Admission: EM | Admit: 2021-05-19 | Discharge: 2021-05-20 | Disposition: A | Discharge status: Home / Self Care | Payer: Medicaid Other | Attending: Emergency Medicine | Admitting: Emergency Medicine

## 2021-05-19 ENCOUNTER — Other Ambulatory Visit: Payer: Self-pay

## 2021-05-19 ENCOUNTER — Encounter (HOSPITAL_COMMUNITY): Payer: Self-pay | Admitting: *Deleted

## 2021-05-19 ENCOUNTER — Emergency Department (HOSPITAL_COMMUNITY): Payer: Medicaid Other

## 2021-05-19 DIAGNOSIS — R42 Dizziness and giddiness: Secondary | ICD-10-CM | POA: Diagnosis not present

## 2021-05-19 DIAGNOSIS — Z859 Personal history of malignant neoplasm, unspecified: Secondary | ICD-10-CM | POA: Diagnosis not present

## 2021-05-19 DIAGNOSIS — F17210 Nicotine dependence, cigarettes, uncomplicated: Secondary | ICD-10-CM | POA: Insufficient documentation

## 2021-05-19 DIAGNOSIS — R001 Bradycardia, unspecified: Secondary | ICD-10-CM | POA: Insufficient documentation

## 2021-05-19 DIAGNOSIS — R109 Unspecified abdominal pain: Secondary | ICD-10-CM | POA: Insufficient documentation

## 2021-05-19 DIAGNOSIS — R112 Nausea with vomiting, unspecified: Secondary | ICD-10-CM | POA: Diagnosis not present

## 2021-05-19 DIAGNOSIS — G9389 Other specified disorders of brain: Secondary | ICD-10-CM | POA: Diagnosis not present

## 2021-05-19 DIAGNOSIS — R197 Diarrhea, unspecified: Secondary | ICD-10-CM | POA: Insufficient documentation

## 2021-05-19 DIAGNOSIS — Z9889 Other specified postprocedural states: Secondary | ICD-10-CM | POA: Diagnosis not present

## 2021-05-19 LAB — COMPREHENSIVE METABOLIC PANEL
ALT: 25 U/L (ref 0–44)
AST: 25 U/L (ref 15–41)
Albumin: 4.5 g/dL (ref 3.5–5.0)
Alkaline Phosphatase: 57 U/L (ref 38–126)
Anion gap: 9 (ref 5–15)
BUN: 20 mg/dL (ref 6–20)
CO2: 23 mmol/L (ref 22–32)
Calcium: 9.5 mg/dL (ref 8.9–10.3)
Chloride: 108 mmol/L (ref 98–111)
Creatinine, Ser: 0.99 mg/dL (ref 0.61–1.24)
GFR, Estimated: >60 mL/min (ref >60)
Glucose, Bld: 193 mg/dL — ABNORMAL HIGH (ref 70–99)
Potassium: 4 mmol/L (ref 3.5–5.1)
Sodium: 140 mmol/L (ref 135–145)
Total Bilirubin: 1.6 mg/dL — ABNORMAL HIGH (ref 0.3–1.2)
Total Protein: 7.3 g/dL (ref 6.5–8.1)

## 2021-05-19 LAB — CBC WITH DIFFERENTIAL/PLATELET
Abs Immature Granulocytes: 0.08 10*3/uL — ABNORMAL HIGH (ref 0.00–0.07)
Basophils Absolute: 0 10*3/uL (ref 0.0–0.1)
Basophils Relative: 0 %
Eosinophils Absolute: 0 10*3/uL (ref 0.0–0.5)
Eosinophils Relative: 0 %
HCT: 44.3 % (ref 39.0–52.0)
Hemoglobin: 15.2 g/dL (ref 13.0–17.0)
Immature Granulocytes: 1 %
Lymphocytes Relative: 2 %
Lymphs Abs: 0.3 10*3/uL — ABNORMAL LOW (ref 0.7–4.0)
MCH: 32.3 pg (ref 26.0–34.0)
MCHC: 34.3 g/dL (ref 30.0–36.0)
MCV: 94.3 fL (ref 80.0–100.0)
Monocytes Absolute: 1 10*3/uL (ref 0.1–1.0)
Monocytes Relative: 6 %
Neutro Abs: 14.4 10*3/uL — ABNORMAL HIGH (ref 1.7–7.7)
Neutrophils Relative %: 91 %
Platelets: 187 10*3/uL (ref 150–400)
RBC: 4.7 MIL/uL (ref 4.22–5.81)
RDW: 12 % (ref 11.5–15.5)
WBC: 15.9 10*3/uL — ABNORMAL HIGH (ref 4.0–10.5)
nRBC: 0 % (ref 0.0–0.2)

## 2021-05-19 LAB — LIPASE, BLOOD: Lipase: 24 U/L (ref 11–51)

## 2021-05-19 IMAGING — CT CT HEAD W/O CM
4 series · 16 of 47 positions shown, 18 images · non-contrast
Comparison: MR [DATE]

CLINICAL DATA: Nausea, vomiting, recent brain surgery

EXAM:
CT HEAD WITHOUT CONTRAST
TECHNIQUE: Contiguous axial images were obtained from the base of the skull
through the vertex without intravenous contrast.

[Series 2: head w o · axial · 0.45mm/px · z∈[-591,-476]mm · 7 of 31 slices shown, 9 images]
[im 4/31  brain]
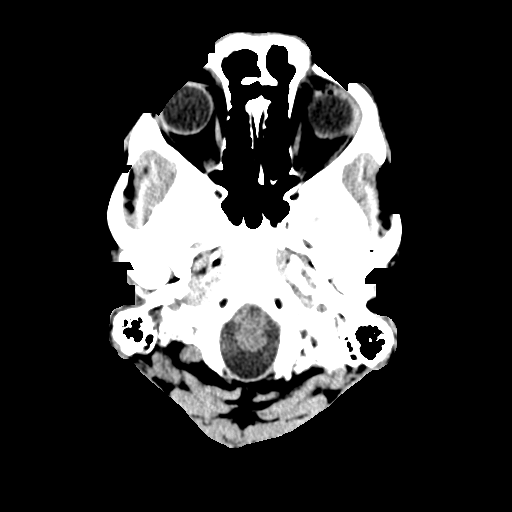
[im 4/31  bone]
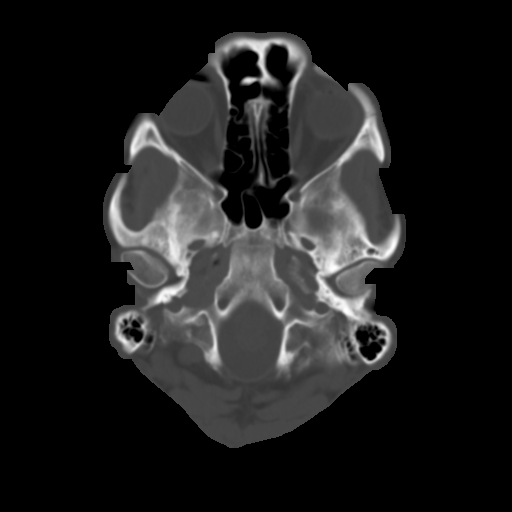
[im 8/31  brain]
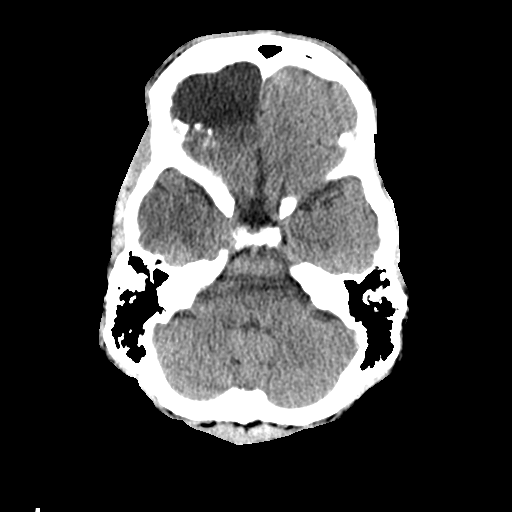
[im 12/31  brain]
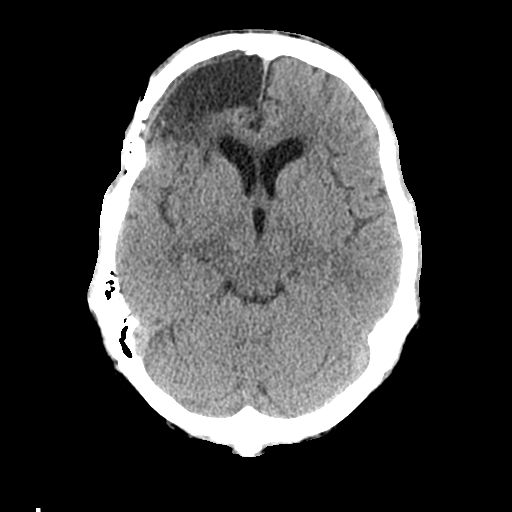
[im 16/31  brain]
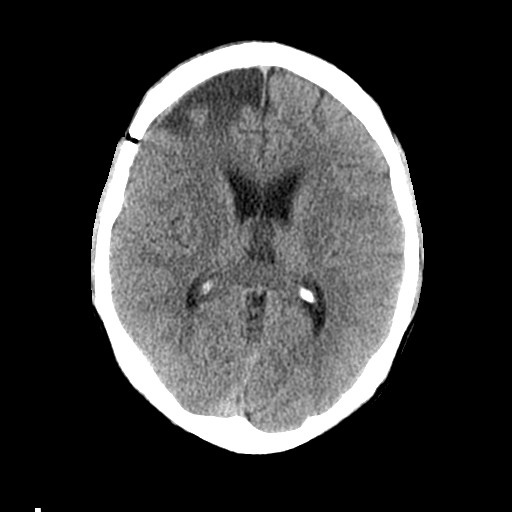
[im 19/31  brain]
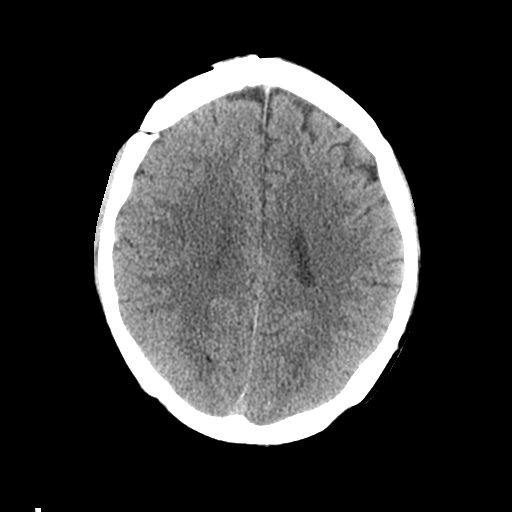
[im 19/31  bone]
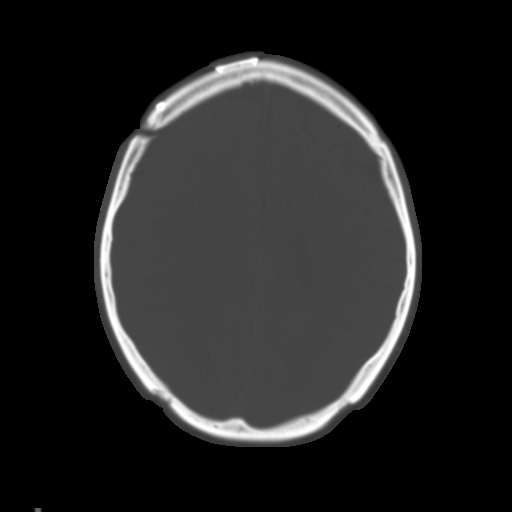
[im 23/31  brain]
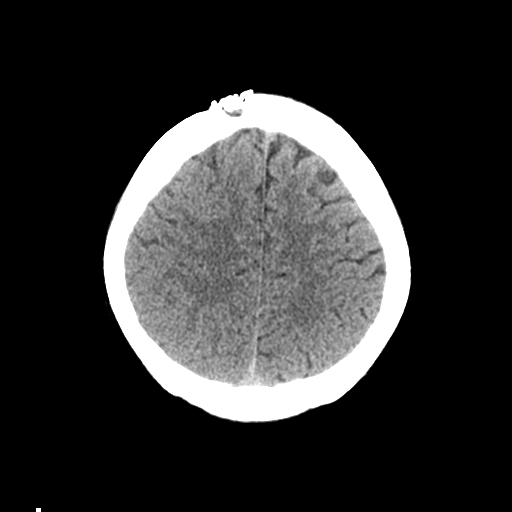
[im 27/31  brain]
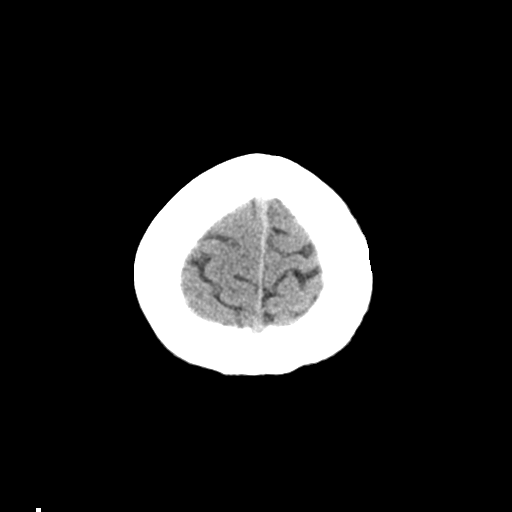

[Series 3: head bone · axial · 0.45mm/px · z∈[-592,-560]mm · 3 of 78 slices shown]
[im 8/78  bone]
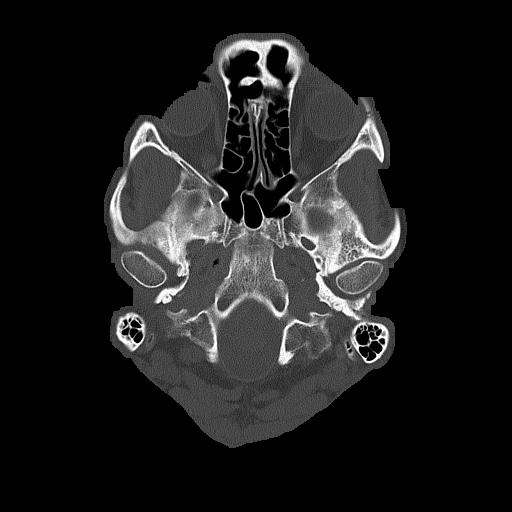
[im 16/78  bone]
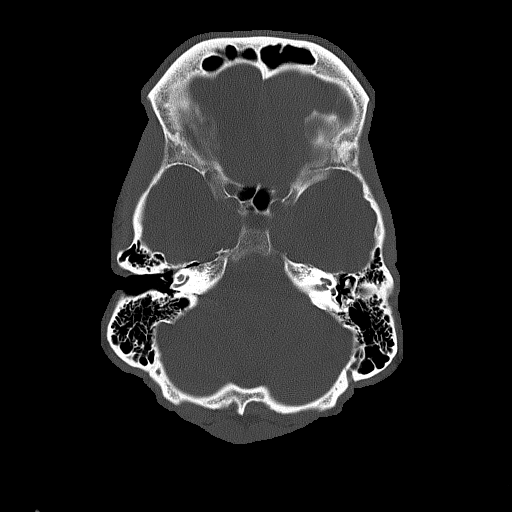
[im 24/78  bone]
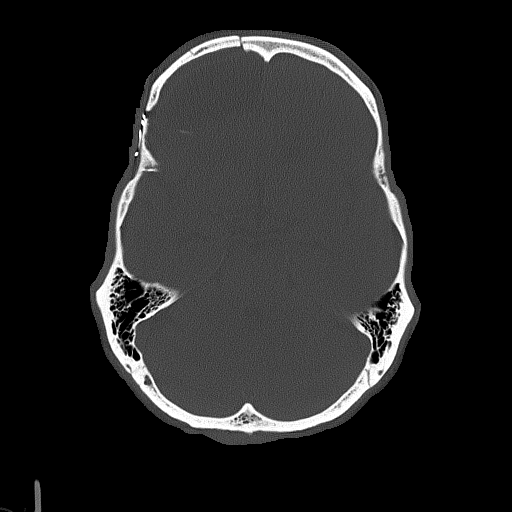

[Series 4: coronal soft · coronal · 0.34mm/px · 3 of 79 slices shown]
[im 27/79  brain]
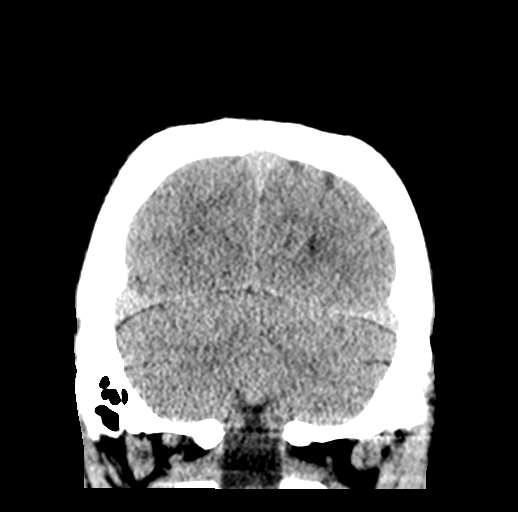
[im 35/79  brain]
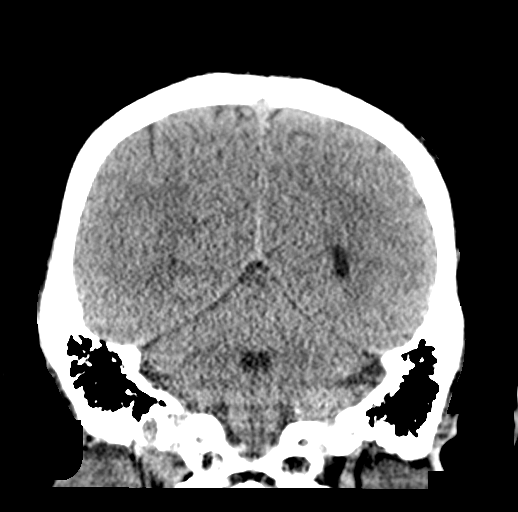
[im 44/79  brain]
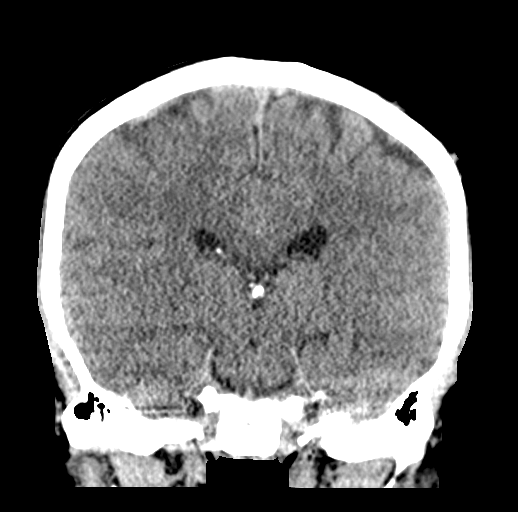

[Series 5: sagittal soft · sagittal · 0.32mm/px · 3 of 62 slices shown]
[im 21/62  brain]
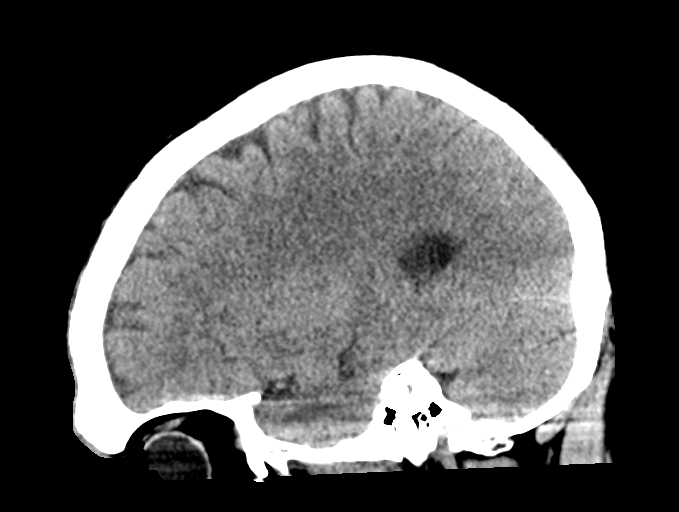
[im 31/62  brain]
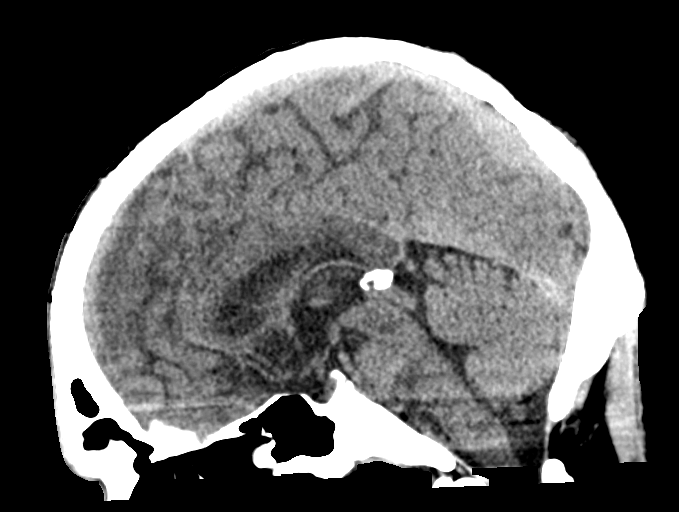
[im 41/62  brain]
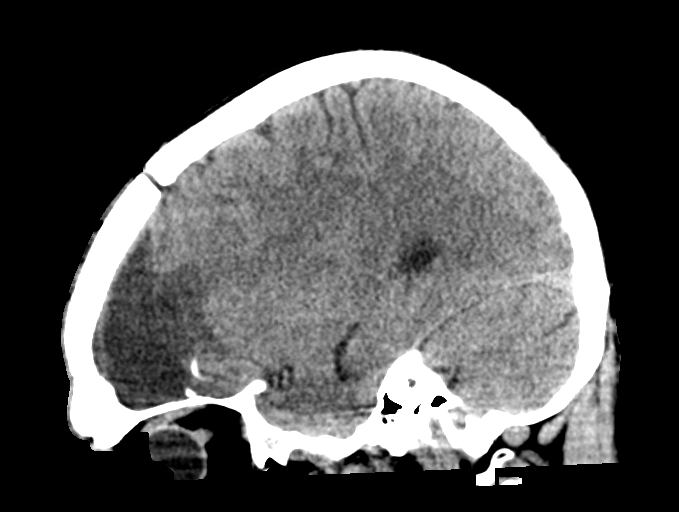

[16 of 47 positions shown; findings below may reference images not displayed]

FINDINGS: Brain: Postsurgical changes from prior right frontal craniotomy and
mass resection a previously demonstrated right frontal
oligodendroglioma. Encephalomalacia along right frontal lobe. Some
nonspecific thickening of the dura is fairly typical post resection.
Some mineralization noted along posterior resection margin as well.
Uncomplicated appearance of fluid subjacent to the craniotomy flap.
No hyperdense hemorrhage. No new mass effect or midline shift.
Evaluation for residual/recurrent mass is largely precluded on
unenhanced CT images. No CT evident areas of large vascular
territory infarct. No hydrocephaly.

Vascular: No hyperdense vessel or unexpected calcification.

Skull: Postsurgical changes from right frontal craniotomy. No acute
complication is evident at this time.

Sinuses/Orbits: Paranasal sinuses and mastoid air cells are
predominantly clear. Included orbital structures are unremarkable.

Other: None.
IMPRESSION: Postsurgical changes from prior right frontal craniotomy and mass
resection of an oligodendroglioma seen on comparison prior imaging.

No gross complication is seen at the resection site with expected
encephalomalacia of the right frontal lobe. Mild dural thickening
and mineralization along the resection margins are nonspecific
though can be seen as a typical postoperative finding. If there is
concern for local recurrence or other acute complication, consider
contrast enhanced MR imaging for further evaluation.

No hyperdense hemorrhage, residual mass effect or midline shift.

## 2021-05-19 IMAGING — CT CT ABD-PELV W/ CM
2 of 4 series · 16 of 46 positions shown, 18 images · IV contrast (Omnipaque or Isovue)
Comparison: CT abdomen pelvis dated [DATE].

CLINICAL DATA: 50-year-old male with abdominal pain. Concern for
acute diverticulitis.

EXAM:
CT ABDOMEN AND PELVIS WITH CONTRAST
TECHNIQUE: Multidetector CT imaging of the abdomen and pelvis was performed
using the standard protocol following bolus administration of
intravenous contrast.
CONTRAST:  75mL OMNIPAQUE IOHEXOL 300 MG/ML  SOLN

[Series 2: axial st · axial · 0.72mm/px · z∈[-1356,-961]mm · 13 of 87 slices shown, 15 images]
[im 4/87  soft-tissue]
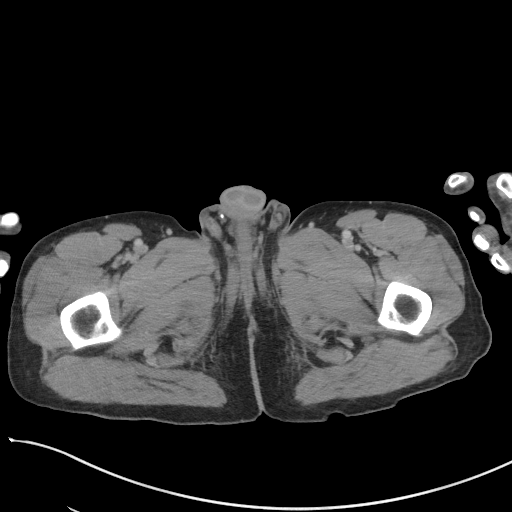
[im 4/87  bone]
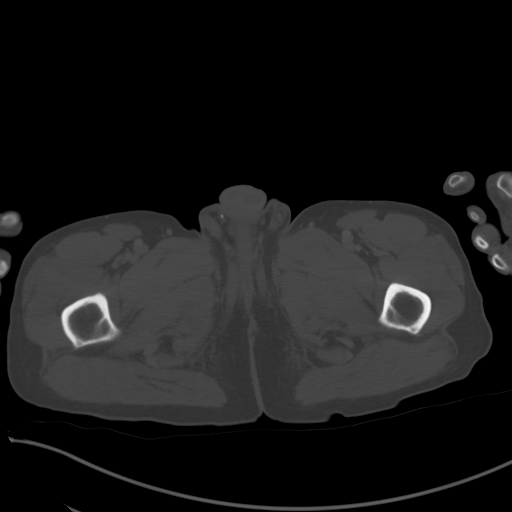
[im 12/87  soft-tissue]
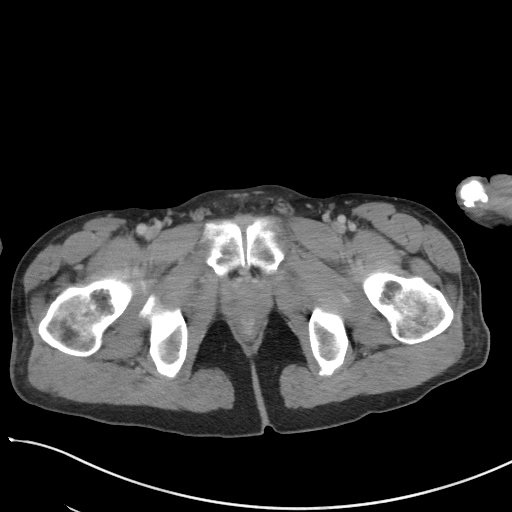
[im 19/87  soft-tissue]
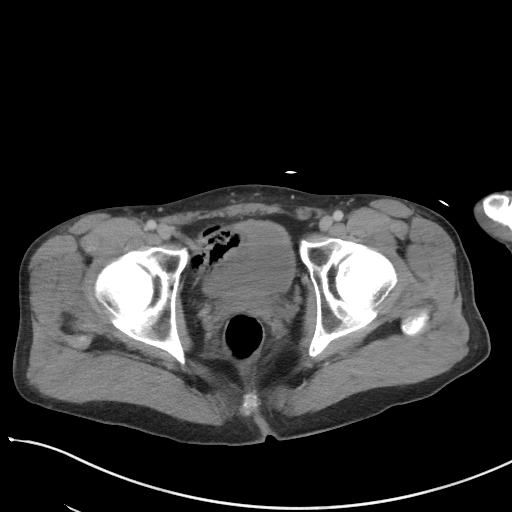
[im 23/87  soft-tissue]
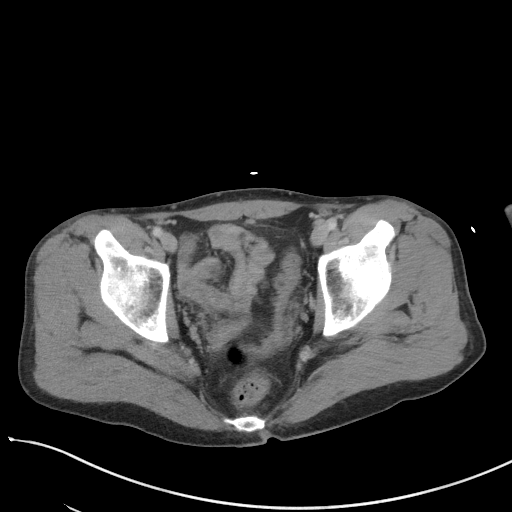
[im 30/87  soft-tissue]
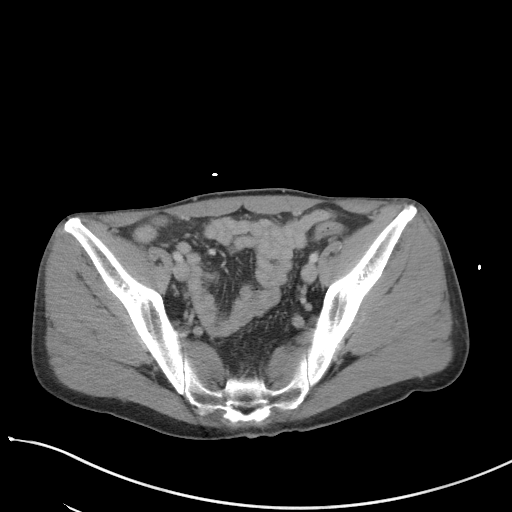
[im 38/87  soft-tissue]
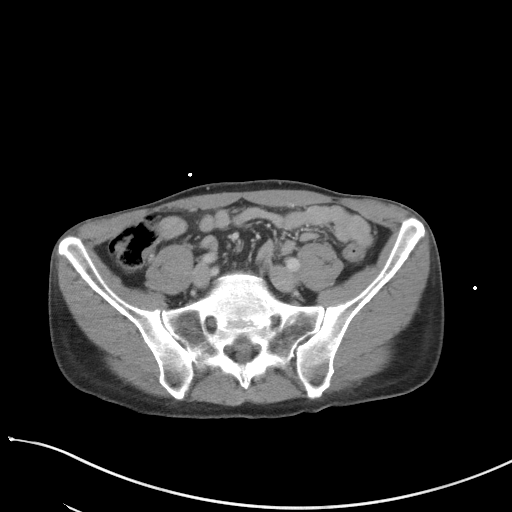
[im 45/87  soft-tissue]
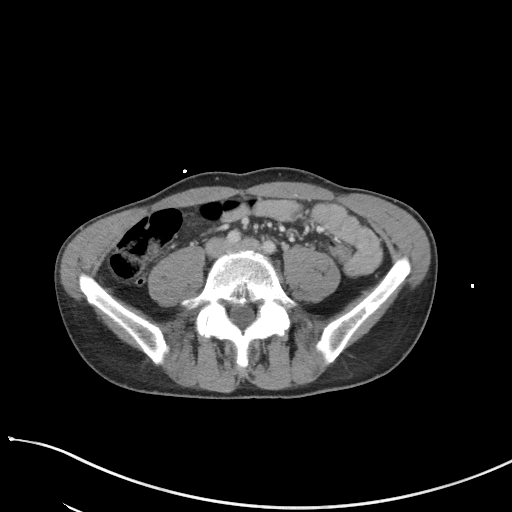
[im 49/87  soft-tissue]
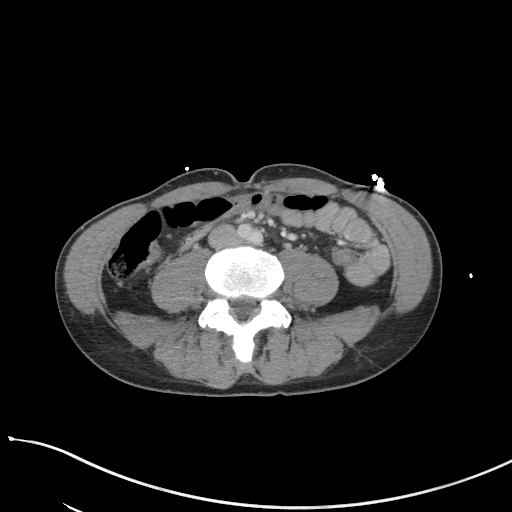
[im 57/87  soft-tissue]
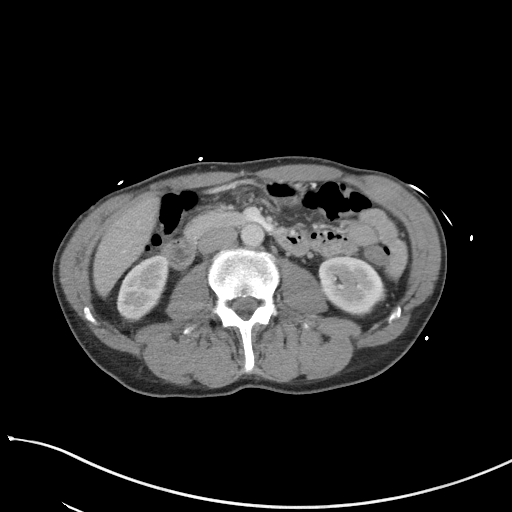
[im 57/87  bone]
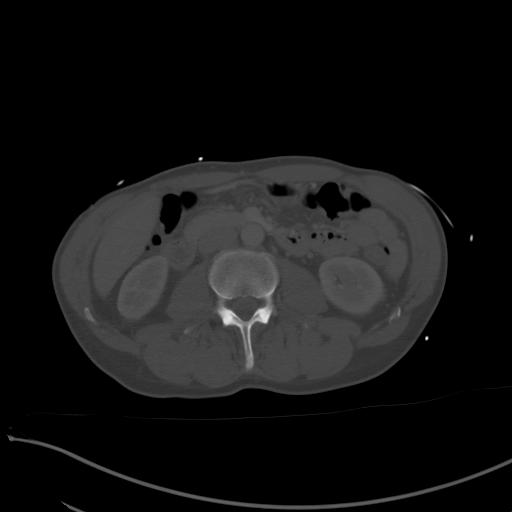
[im 64/87  soft-tissue]
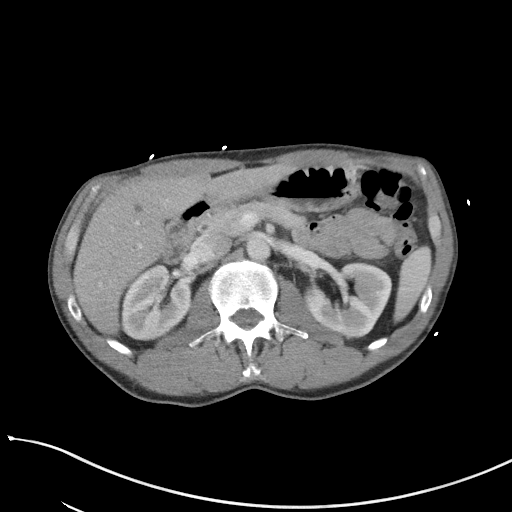
[im 68/87  soft-tissue]
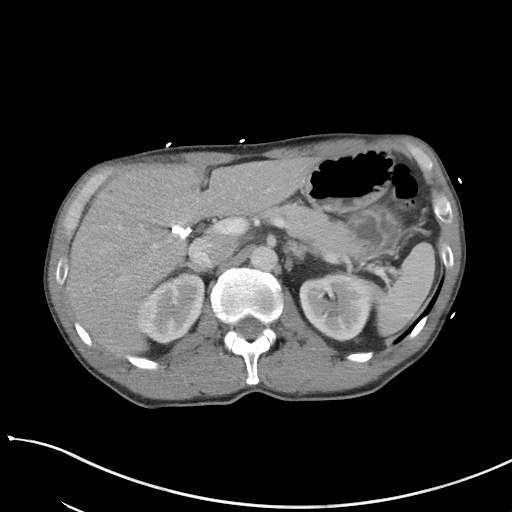
[im 75/87  soft-tissue]
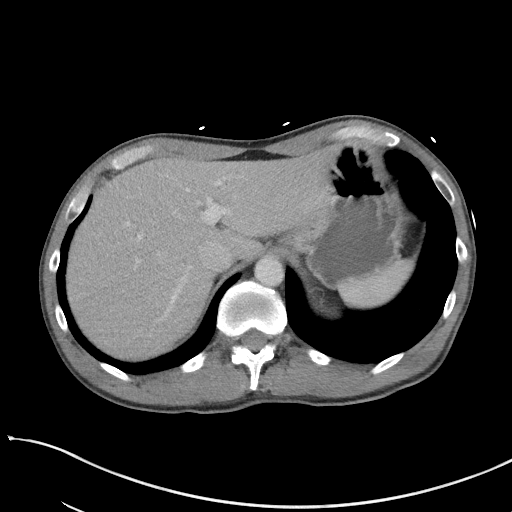
[im 83/87  soft-tissue]
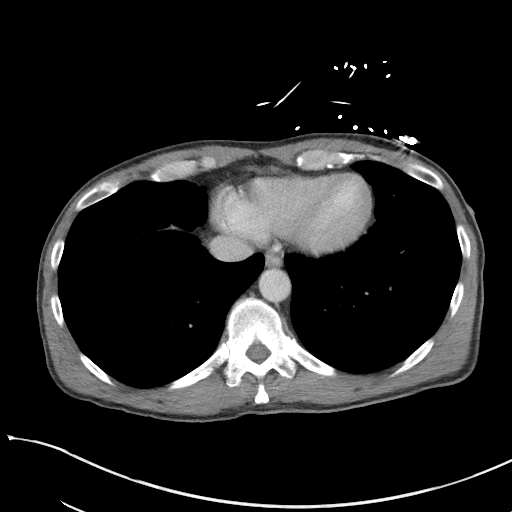

[Series 5: coronal st · coronal · 0.66mm/px · 3 of 76 slices shown]
[im 26/76  soft-tissue]
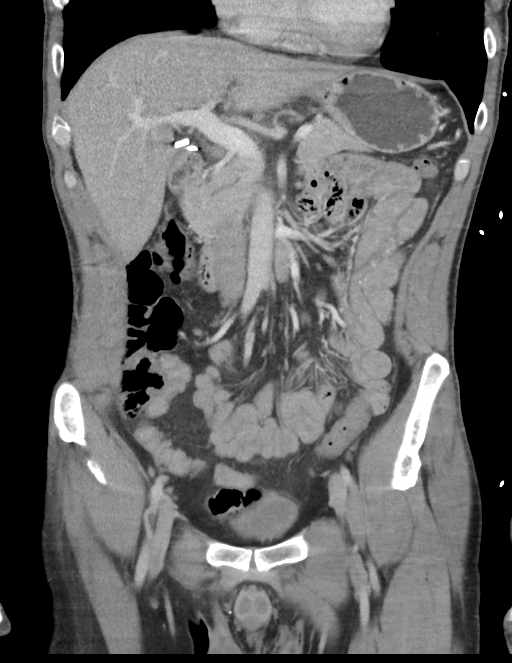
[im 34/76  soft-tissue]
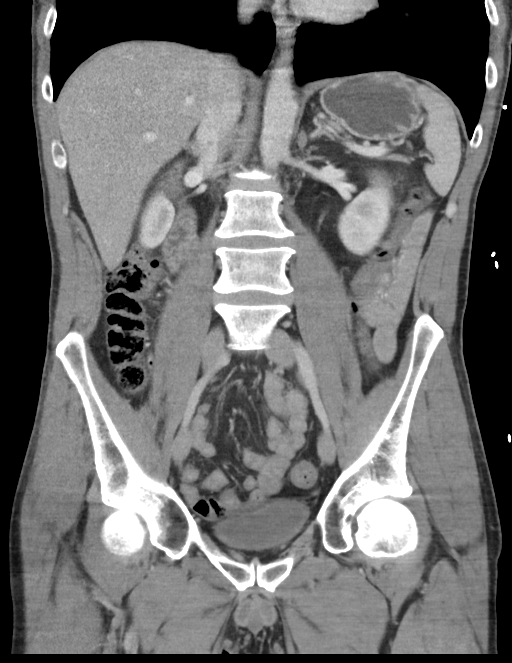
[im 42/76  soft-tissue]
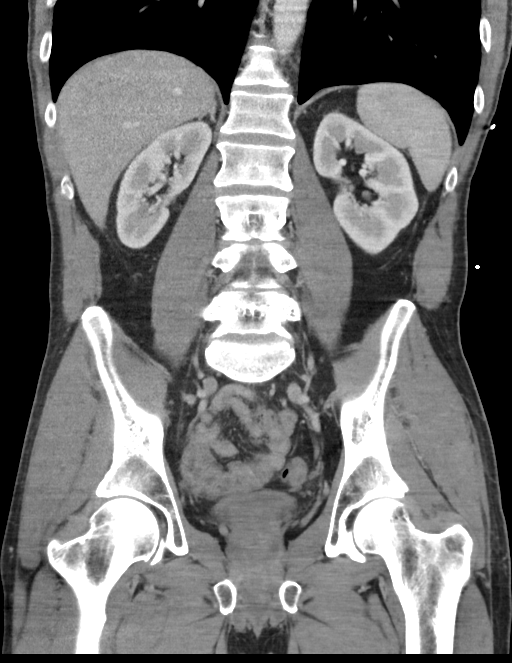

[16 of 46 positions shown; findings below may reference images not displayed]

FINDINGS: Lower chest: The visualized lung bases are clear.

No intra-abdominal free air or free fluid.

Hepatobiliary: Several subcentimeter hepatic hypodense lesions are
too small to characterize. The liver is otherwise unremarkable. No
intrahepatic biliary ductal dilatation. Cholecystectomy. No retained
calcified stone noted in the central CBD.

Pancreas: Unremarkable. No pancreatic ductal dilatation or
surrounding inflammatory changes.

Spleen: Normal in size without focal abnormality.

Adrenals/Urinary Tract: The adrenal glands unremarkable. The
kidneys, visualized ureters, and urinary bladder are unremarkable.

Stomach/Bowel: Mild thickened appearance of the colon, likely
related to underdistention. Colitis is less likely. Clinical
correlation is recommended. There is no bowel obstruction. The
appendix is normal.

Vascular/Lymphatic: The abdominal aorta and IVC are unremarkable. No
portal venous gas. There is no adenopathy.

Reproductive: The prostate and seminal vesicles are grossly
unremarkable. No pelvic mass

Other: None

Musculoskeletal: No acute or significant osseous findings.
IMPRESSION: Underdistention of the colon versus mild colitis. Clinical
correlation is recommended. No bowel obstruction. Normal appendix.

## 2021-05-19 MED ORDER — PROMETHAZINE HCL 25 MG RE SUPP: 25.0000 mg | Freq: Four times a day (QID) | RECTAL | 0 refills | Status: DC | PRN

## 2021-05-19 MED ORDER — SODIUM CHLORIDE 0.9 % IV SOLN: INTRAVENOUS | Status: DC

## 2021-05-19 MED ORDER — IOHEXOL 300 MG/ML  SOLN
100.0000 mL | Freq: Once | INTRAMUSCULAR | Status: AC | PRN
  Administered 2021-05-19: 75 mL via INTRAVENOUS

## 2021-05-19 MED ORDER — HYDROMORPHONE HCL 1 MG/ML IJ SOLN
1.0000 mg | Freq: Once | INTRAMUSCULAR | Status: AC
  Administered 2021-05-19: 1 mg via INTRAVENOUS
  Filled 2021-05-19: qty 1

## 2021-05-19 MED ORDER — ONDANSETRON HCL 4 MG/2ML IJ SOLN
8.0000 mg | Freq: Once | INTRAMUSCULAR | Status: AC
  Administered 2021-05-19: 8 mg via INTRAVENOUS
  Filled 2021-05-19: qty 4

## 2021-05-19 NOTE — ED Provider Notes (Signed)
[facility] EMERGENCY DEPARTMENT Provider Note   CSN: [id number] Arrival date & time: 05/19/21  1931     History Chief Complaint  Patient presents with   Emesis    Cancer pt    Troy Leon is a 50 y.o. male.   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  His last dose of chemotherapy was June 21, he took 5 days in a row, he is scheduled to have a repeat dose of 5 days in a row on July 21 as long as his blood work looks good later this month.  The patient states he was in his usual state of health, generally weak until this morning when he woke up and started having increasing abdominal pain, increasing nausea and throughout the day has had very little to eat or drink, he has had ongoing worsening abdominal pain and recurrent bouts of severe nausea and vomiting.  No fevers or chills, no coughing or shortness of breath, no abnormal bloody bowel movements, he has had some loose stools over the last couple of days.  He denies alcohol use, he has not been around anybody who has been sick, the only person at home is his wife who has not been ill at all and they do not go out.  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[place]) 02/01/2021   S/P craniotomy 01/09/2021   Brain lesion 01/09/2021   Increased severity of headaches 12/01/2020   Dizziness 12/01/2020   Slurred speech 12/01/2020   Fatigue 07/13/2020   Abdominal pain 07/18/2013   Gastroenteritis, acute 07/18/2013   Dehydration 07/18/2013   Tobacco abuse 07/18/2013   Intractable vomiting 01/30/2013   Abdominal pain, epigastric 01/30/2013   Leukocytosis, unspecified 01/30/2013   Cigarette smoker 01/30/2013   Lactic  acidosis 01/30/2013    Past Surgical History:  Procedure Laterality Date   APPLICATION OF CRANIAL NAVIGATION N/A 01/09/2021   Procedure: APPLICATION OF CRANIAL NAVIGATION;  Surgeon: Karsten Ro, DO;  Location: [place];  Service: Neurosurgery;  Laterality: N/A;   CHOLECYSTECTOMY     CRANIOTOMY N/A 01/09/2021   Procedure: CRANIOTOMY FOR TUMOR EXCISION;  Surgeon: Karsten Ro, DO;  Location: [place];  Service: Neurosurgery;  Laterality: N/A;       Family History  Problem Relation Age of Onset   Diabetes Mother    Diabetes Other    Heart attack Other    Hypertension Other     Social History   Tobacco Use   Smoking status: Every Day    Packs/day: 0.50    Pack years: 0.00    Types: Cigarettes   Smokeless tobacco: Never  Vaping Use   Vaping Use: Never used  Substance Use Topics   Alcohol use: No   Drug use: Yes    Frequency: 2.0 times per week    Types: Marijuana    Home Medications Prior to Admission medications   Medication Sig Start Date End Date Taking? Authorizing Provider  promethazine (PHENERGAN) 25 MG suppository Place 1 suppository (25 mg total) rectally every 6 (six) hours as needed for nausea or vomiting. 05/19/21  Yes Noemi Chapel, MD  levETIRAcetam (KEPPRA) 500 MG tablet Take 1 tablet (500  mg total) by mouth 2 (two) times daily. 01/11/21   Council Mechanic, NP  LORazepam (ATIVAN) 0.5 MG tablet 1 tab po 30 minutes prior to radiation Patient not taking: Reported on 04/24/2021 02/21/21   Ronny Bacon, PA-C  omeprazole (PRILOSEC) 20 MG capsule Take 1 capsule (20 mg total) by mouth 2 (two) times daily. 01/11/21 01/11/22  Council Mechanic, NP  ondansetron (ZOFRAN) 8 MG tablet TAKE 1 TABLET BY MOUTH 2 TIMES DAILY AS NEEDED FOR NAUSEA AND VOMITING,MAY TAKE 30-60 MIN PRIOR TO TEMODAR ADMINISTRATION IF NAUSEA/VOMITING OCCURS Patient not taking: Reported on 04/24/2021 02/02/21 02/02/22  Henreitta Leber, MD  temozolomide (TEMODAR) 140 MG capsule Take 2 capsules (280 mg  total) by mouth at bedtime. May take on an empty stomach to decrease nausea & vomiting. 04/24/21   Henreitta Leber, MD    Allergies    Penicillins  Review of Systems   Review of Systems  Gastrointestinal:  Positive for vomiting.  All other systems reviewed and are negative.  Physical Exam Updated Vital Signs BP 103/64 (BP Location: Left Arm)   Pulse (!) 52   Temp 97.8 F (36.6 C) (Oral)   Resp 14   Ht 1.702 m (5\' 7" )   Wt 63.5 kg   SpO2 100%   BMI 21.93 kg/m   Physical Exam Vitals and nursing note reviewed.  Constitutional:      General: He is not in acute distress.    Appearance: He is well-developed. He is ill-appearing.     Comments: Chronically ill-appearing  HENT:     Head: Normocephalic and atraumatic.     Nose: Nose normal. No congestion or rhinorrhea.     Mouth/Throat:     Pharynx: No oropharyngeal exudate.  Eyes:     General: No scleral icterus.       Right eye: No discharge.        Left eye: No discharge.     Conjunctiva/sclera: Conjunctivae normal.     Pupils: Pupils are equal, round, and reactive to light.  Neck:     Thyroid: No thyromegaly.     Vascular: No JVD.  Cardiovascular:     Rate and Rhythm: Regular rhythm. Bradycardia present.     Heart sounds: Normal heart sounds. No murmur heard.   No friction rub. No gallop.     Comments: Heart rate of 41 bpm, the patient and his significant other states this is normal for him Pulmonary:     Effort: Pulmonary effort is normal. No respiratory distress.     Breath sounds: Normal breath sounds. No wheezing or rales.  Abdominal:     General: Bowel sounds are normal. There is no distension.     Palpations: Abdomen is soft. There is no mass.     Tenderness: There is abdominal tenderness.     Comments: Diffuse abdominal tenderness, soft, nonperitoneal, no guarding, the patient does wince with any abdominal exam especially the epigastric  Musculoskeletal:        General: No swelling, tenderness or deformity.  Normal range of motion.     Cervical back: Normal range of motion and neck supple.     Right lower leg: No edema.     Left lower leg: No edema.  Lymphadenopathy:     Cervical: No cervical adenopathy.  Skin:    General: Skin is warm and dry.     Findings: No erythema or rash.  Neurological:     Mental Status: He is alert.  Coordination: Coordination normal.     Comments: The patient is awake alert and able to follow commands, he is generally weak but moves all 4 extremities per normal, mental status is per normal  Psychiatric:        Behavior: Behavior normal.    ED Results / Procedures / Treatments   Labs (all labs ordered are listed, but only abnormal results are displayed) Labs Reviewed  COMPREHENSIVE METABOLIC PANEL - Abnormal; Notable for the following components:      Result Value   Glucose, Bld 193 (*)    Total Bilirubin 1.6 (*)    All other components within normal limits  CBC WITH DIFFERENTIAL/PLATELET - Abnormal; Notable for the following components:   WBC 15.9 (*)    Neutro Abs 14.4 (*)    Lymphs Abs 0.3 (*)    Abs Immature Granulocytes 0.08 (*)    All other components within normal limits  URINE CULTURE  LIPASE, BLOOD  URINALYSIS, ROUTINE W REFLEX MICROSCOPIC    EKG None  Radiology CT Head Wo Contrast  Result Date: 05/19/2021 CLINICAL DATA:  Nausea, vomiting, recent brain surgery EXAM: CT HEAD WITHOUT CONTRAST TECHNIQUE: Contiguous axial images were obtained from the base of the skull through the vertex without intravenous contrast. COMPARISON:  MR 04/19/2021 FINDINGS: Brain: Postsurgical changes from prior right frontal craniotomy and mass resection a previously demonstrated right frontal oligodendroglioma. Encephalomalacia along right frontal lobe. Some nonspecific thickening of the dura is fairly typical post resection. Some mineralization noted along posterior resection margin as well. Uncomplicated appearance of fluid subjacent to the craniotomy flap. No  hyperdense hemorrhage. No new mass effect or midline shift. Evaluation for residual/recurrent mass is largely precluded on unenhanced CT images. No CT evident areas of large vascular territory infarct. No hydrocephaly. Vascular: No hyperdense vessel or unexpected calcification. Skull: Postsurgical changes from right frontal craniotomy. No acute complication is evident at this time. Sinuses/Orbits: Paranasal sinuses and mastoid air cells are predominantly clear. Included orbital structures are unremarkable. Other: None. IMPRESSION: Postsurgical changes from prior right frontal craniotomy and mass resection of an oligodendroglioma seen on comparison prior imaging. No gross complication is seen at the resection site with expected encephalomalacia of the right frontal lobe. Mild dural thickening and mineralization along the resection margins are nonspecific though can be seen as a typical postoperative finding. If there is concern for local recurrence or other acute complication, consider contrast enhanced MR imaging for further evaluation. No hyperdense hemorrhage, residual mass effect or midline shift. Electronically Signed   By: Lovena Le M.D.   On: 05/19/2021 23:31   CT ABDOMEN PELVIS W CONTRAST  Result Date: 05/19/2021 CLINICAL DATA:  50 year old male with abdominal pain. Concern for acute diverticulitis. EXAM: CT ABDOMEN AND PELVIS WITH CONTRAST TECHNIQUE: Multidetector CT imaging of the abdomen and pelvis was performed using the standard protocol following bolus administration of intravenous contrast. CONTRAST:  37mL OMNIPAQUE IOHEXOL 300 MG/ML  SOLN COMPARISON:  CT abdomen pelvis dated 05/08/2019. FINDINGS: Lower chest: The visualized lung bases are clear. No intra-abdominal free air or free fluid. Hepatobiliary: Several subcentimeter hepatic hypodense lesions are too small to characterize. The liver is otherwise unremarkable. No intrahepatic biliary ductal dilatation. Cholecystectomy. No retained  calcified stone noted in the central CBD. Pancreas: Unremarkable. No pancreatic ductal dilatation or surrounding inflammatory changes. Spleen: Normal in size without focal abnormality. Adrenals/Urinary Tract: The adrenal glands unremarkable. The kidneys, visualized ureters, and urinary bladder are unremarkable. Stomach/Bowel: Mild thickened appearance of the colon, likely related to underdistention.  Colitis is less likely. Clinical correlation is recommended. There is no bowel obstruction. The appendix is normal. Vascular/Lymphatic: The abdominal aorta and IVC are unremarkable. No portal venous gas. There is no adenopathy. Reproductive: The prostate and seminal vesicles are grossly unremarkable. No pelvic mass Other: None Musculoskeletal: No acute or significant osseous findings. IMPRESSION: Underdistention of the colon versus mild colitis. Clinical correlation is recommended. No bowel obstruction. Normal appendix. Electronically Signed   By: Anner Crete M.D.   On: 05/19/2021 23:34    Procedures Procedures   Medications Ordered in ED Medications  0.9 %  sodium chloride infusion ( Intravenous New Bag/Given 05/19/21 2033)  ondansetron [facility]) injection 8 mg (8 mg Intravenous Given 05/19/21 2034)  HYDROmorphone (DILAUDID) injection 1 mg (1 mg Intravenous Given 05/19/21 2034)  iohexol (OMNIPAQUE) 300 MG/ML solution 100 mL (75 mLs Intravenous Contrast Given 05/19/21 2324)    ED Course  I have reviewed the triage vital signs and the nursing notes.  Pertinent labs & imaging results that were available during my care of the patient were reviewed by me and considered in my medical decision making (see chart for details).    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  Labs fluids antinausea medications and likely CT scans will be necessary to rule out more complicated conditions  This patient has improved significantly, he is now not nauseated he is not having any pain, he has received a liter of IV fluids with nausea medicines and hydromorphone and his CT scans do not show any specific abnormalities that would require further evaluation by admission surgical consultation, specialist evaluation or further emergency department stabilizing care.  At this time he wants to go home I think this is reasonable, he will be given a prescription for suppository promethazine in addition to the Zofran that he has.  He is in total agreement  Additionally this patient has tolerated p.o. prior to discharge without any complaint  Final Clinical Impression(s) / ED Diagnoses Final diagnoses:  Non-intractable vomiting with nausea, unspecified vomiting type    Rx / DC Orders ED Discharge Orders          Ordered    promethazine (PHENERGAN) 25 MG suppository  Every 6 hours PRN        05/19/21 2349             Noemi Chapel, MD 05/19/21 2351

## 2021-05-19 NOTE — Discharge Instructions (Addendum)
Your testing tonight show that your blood counts were slightly elevated, there was no other significant abnormal findings.  You will want to have your family doctor recheck your blood work within the next week.  We have given you Zofran, hydromorphone and IV fluids.  Your CT scans did not show any specific abnormalities.  You have improved significantly and at this time you are able to go home however I would like for you to return to the emergency department immediately for any severe or worsening symptoms.  In addition to the ondansetron or Zofran that you have at home I have given you a prescription for a suppository called promethazine or Phenergan.  You may use 1 tablet in the rectum every 6 hours as needed if zofran is not working.

## 2021-05-19 NOTE — ED Notes (Signed)
Patient transported to CT 

## 2021-05-19 NOTE — ED Triage Notes (Signed)
Pt started vomiting today; pt has dry lips and c/o chills

## 2021-05-21 ENCOUNTER — Telehealth: Payer: Self-pay

## 2021-05-21 NOTE — Telephone Encounter (Signed)
Transition Care Management Follow-up Telephone Call Date of discharge and from where: 05/20/2021- [facility] How have you been since you were released from the hospital? Doing better Any questions or concerns? No  Items Reviewed: Did the pt receive and understand the discharge instructions provided? Yes  Medications obtained and verified? Yes  Other? No  Any new allergies since your discharge? No  Dietary orders reviewed? N/a Do you have support at home? Yes   Home Care and Equipment/Supplies: Were home health services ordered? not applicable If so, what is the name of the agency? N/a  Has the agency set up a time to come to the patient's home? not applicable Were any new equipment or medical supplies ordered?  No What is the name of the medical supply agency? N/A Were you able to get the supplies/equipment? not applicable Do you have any questions related to the use of the equipment or supplies? No  Functional Questionnaire: (I = Independent and D = Dependent) ADLs: I  Bathing/Dressing- I  Meal Prep- I  Eating- I  Maintaining continence- I  Transferring/Ambulation- I  Managing Meds- I  Follow up appointments reviewed:  PCP Hospital f/u appt confirmed? No  Patient declined the need to follow up with PCP Specialist Hospital f/u appt confirmed? No  Are transportation arrangements needed? No  If their condition worsens, is the pt aware to call PCP or go to the Emergency Dept.? Yes Was the patient provided with contact information for the PCP's office or ED? Yes Was to pt encouraged to call back with questions or concerns? Yes

## 2021-05-23 ENCOUNTER — Inpatient Hospital Stay: Payer: Medicaid Other | Admitting: Internal Medicine

## 2021-05-23 ENCOUNTER — Inpatient Hospital Stay: Payer: Medicaid Other

## 2021-05-24 ENCOUNTER — Other Ambulatory Visit: Payer: Self-pay

## 2021-05-24 ENCOUNTER — Inpatient Hospital Stay: Payer: Medicaid Other | Attending: Radiation Oncology | Admitting: Internal Medicine

## 2021-05-24 ENCOUNTER — Other Ambulatory Visit (HOSPITAL_COMMUNITY): Payer: Self-pay

## 2021-05-24 ENCOUNTER — Inpatient Hospital Stay: Payer: Medicaid Other

## 2021-05-24 ENCOUNTER — Other Ambulatory Visit: Payer: Self-pay | Admitting: Internal Medicine

## 2021-05-24 ENCOUNTER — Ambulatory Visit: Payer: Medicaid Other | Admitting: Internal Medicine

## 2021-05-24 ENCOUNTER — Other Ambulatory Visit: Payer: Medicaid Other

## 2021-05-24 VITALS — BP 97/65 | HR 51 | Temp 98.1°F | Resp 16 | Ht 67.0 in | Wt 139.5 lb

## 2021-05-24 DIAGNOSIS — C719 Malignant neoplasm of brain, unspecified: Secondary | ICD-10-CM

## 2021-05-24 DIAGNOSIS — Z79899 Other long term (current) drug therapy: Secondary | ICD-10-CM | POA: Insufficient documentation

## 2021-05-24 DIAGNOSIS — C711 Malignant neoplasm of frontal lobe: Secondary | ICD-10-CM | POA: Diagnosis not present

## 2021-05-24 LAB — CBC WITH DIFFERENTIAL (CANCER CENTER ONLY)
Abs Immature Granulocytes: 0.01 10*3/uL (ref 0.00–0.07)
Basophils Absolute: 0 10*3/uL (ref 0.0–0.1)
Basophils Relative: 1 %
Eosinophils Absolute: 0.1 10*3/uL (ref 0.0–0.5)
Eosinophils Relative: 1 %
HCT: 40.7 % (ref 39.0–52.0)
Hemoglobin: 14 g/dL (ref 13.0–17.0)
Immature Granulocytes: 0 %
Lymphocytes Relative: 25 %
Lymphs Abs: 1.4 10*3/uL (ref 0.7–4.0)
MCH: 31.7 pg (ref 26.0–34.0)
MCHC: 34.4 g/dL (ref 30.0–36.0)
MCV: 92.1 fL (ref 80.0–100.0)
Monocytes Absolute: 0.4 10*3/uL (ref 0.1–1.0)
Monocytes Relative: 7 %
Neutro Abs: 3.7 10*3/uL (ref 1.7–7.7)
Neutrophils Relative %: 66 %
Platelet Count: 191 10*3/uL (ref 150–400)
RBC: 4.42 MIL/uL (ref 4.22–5.81)
RDW: 11.9 % (ref 11.5–15.5)
WBC Count: 5.6 10*3/uL (ref 4.0–10.5)
nRBC: 0 % (ref 0.0–0.2)

## 2021-05-24 LAB — CMP (CANCER CENTER ONLY)
ALT: 18 U/L (ref 0–44)
AST: 14 U/L — ABNORMAL LOW (ref 15–41)
Albumin: 3.7 g/dL (ref 3.5–5.0)
Alkaline Phosphatase: 57 U/L (ref 38–126)
Anion gap: 8 (ref 5–15)
BUN: 13 mg/dL (ref 6–20)
CO2: 29 mmol/L (ref 22–32)
Calcium: 9 mg/dL (ref 8.9–10.3)
Chloride: 107 mmol/L (ref 98–111)
Creatinine: 0.85 mg/dL (ref 0.61–1.24)
GFR, Estimated: >60 mL/min (ref >60)
Glucose, Bld: 108 mg/dL — ABNORMAL HIGH (ref 70–99)
Potassium: 4.2 mmol/L (ref 3.5–5.1)
Sodium: 144 mmol/L (ref 135–145)
Total Bilirubin: 0.7 mg/dL (ref 0.3–1.2)
Total Protein: 6.4 g/dL — ABNORMAL LOW (ref 6.5–8.1)

## 2021-05-24 MED ORDER — LAMOTRIGINE 25 MG PO TABS: ORAL_TABLET | ORAL | 0 refills | Status: DC

## 2021-05-24 MED ORDER — ONDANSETRON HCL 8 MG PO TABS: ORAL_TABLET | ORAL | 1 refills | Status: DC

## 2021-05-24 MED ORDER — LAMOTRIGINE 100 MG PO TABS: 100.0000 mg | ORAL_TABLET | Freq: Every day | ORAL | 1 refills | Status: DC

## 2021-05-24 NOTE — Progress Notes (Signed)
[facility] [place] Troy Leon, Troy Leon [address] [phone]   Interval Evaluation  Date of Service: 05/24/21 Patient Name: Troy Leon Patient MRN: [id number] Patient DOB: 12/16/1970 Provider: Ventura Sellers, MD  Identifying Statement:  Troy Leon is a 50 y.o. male with right frontal  oligodendroglioma grade III     Oncology History  Oligodendroglioma, IDH gene mutant and 1p/19q-codeleted ([place])  01/09/2021 Surgery   Craniotomy, right frontal resection by Dr. Reatha Armour   02/19/2021 - 03/30/2021 Radiation Therapy   IMRT and concurrent Temodar 75mg /m2     Biomarkers:  MGMT Unknown.  IDH 1/2 Mutated.  EGFR Unknown  1p/19q co-deleted   Interval History:  Troy Leon presents today for follow up, now having completed first cycle of 5-day Temodar.  Tolerated chemo ok, with significant fatigue during week of therapy.  He did have a bout of severe nausea and vomiting last week which led to an ED eval at [facility].  He is still experiencing milder nausea and abdominal discomfort.  Otherwise denies new or progressive neurologic deficits, no seizures or headaches.  Wife continues to remark on irritability with Keppra.  H+P (01/26/21) Patient presented to medical attention in February 2022 with several weeks history of new onset headaches.  He describes holo-cranial pain, severe in nature, often associated with AM time period.  In addition, he describes multiple episodes of "sudden dropping, legs getting out, feeling like I'm losing contact", followed by recovery associated with a period of fatigue.  Notably, these episodes stopped following surgery/resection on 01/09/21 ([name]) and initiation of anti-seizure medication.  Currently he feels well, aside from frequent burping and spasming of "esophagus".  Taking 4mg  of decadron three times per day since discharge.  No functional deficits otherwise.  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  Medications: Current Outpatient Medications on File Prior to Visit  Medication Sig Dispense Refill   levETIRAcetam (KEPPRA) 500 MG tablet Take 1 tablet (500 mg total) by mouth 2 (two) times daily. 60 tablet 3   LORazepam (ATIVAN) 0.5 MG tablet 1 tab po 30 minutes prior to radiation (Patient not taking: Reported on 04/24/2021) 30 tablet 0   omeprazole (PRILOSEC) 20 MG capsule Take 1 capsule (20 mg total) by mouth 2 (two) times daily. 60 capsule 3   ondansetron (ZOFRAN) 8 MG tablet TAKE 1 TABLET BY MOUTH 2 TIMES DAILY AS NEEDED FOR NAUSEA AND VOMITING,MAY TAKE 30-60 MIN PRIOR TO TEMODAR ADMINISTRATION IF NAUSEA/VOMITING OCCURS (Patient not taking: Reported on 04/24/2021) 30 tablet 1   promethazine (PHENERGAN) 25 MG suppository Place 1 suppository (25 mg total) rectally every 6 (six) hours as needed for nausea or vomiting. 12 each 0   temozolomide (TEMODAR) 140 MG capsule Take 2 capsules (280 mg total) by mouth at bedtime. May take on an empty stomach to decrease nausea & vomiting. 10 capsule 0   No current facility-administered medications on file prior to visit.    Allergies:  Allergies  Allergen Reactions   Penicillins Other (See Comments)    Has patient had a PCN reaction causing immediate rash, facial/tongue/throat swelling, SOB or lightheadedness with hypotension: unknown Has patient had a PCN reaction causing severe rash involving mucus membranes or skin necrosis: unknown Has patient had a PCN reaction that required hospitalization unknown Has patient had a PCN reaction occurring within the last 10 years:no If all of the above answers are "NO", then may proceed wit  Past Medical History:  Past Medical History:  Diagnosis Date   Oligodendroglioma of frontal lobe ([place]) 01/2021   Pancreatitis    Past Surgical History:  Past Surgical History:  Procedure Laterality Date   APPLICATION OF CRANIAL NAVIGATION N/A 01/09/2021   Procedure: APPLICATION OF CRANIAL  NAVIGATION;  Surgeon: Karsten Ro, DO;  Location: [facility];  Service: Neurosurgery;  Laterality: N/A;   CHOLECYSTECTOMY     CRANIOTOMY N/A 01/09/2021   Procedure: CRANIOTOMY FOR TUMOR EXCISION;  Surgeon: Karsten Ro, DO;  Location: [facility];  Service: Neurosurgery;  Laterality: N/A;   Social History:  Social History   Socioeconomic History   Marital status: Married    Spouse name: Not on file   Number of children: Not on file   Years of education: Not on file   Highest education level: Not on file  Occupational History   Not on file  Tobacco Use   Smoking status: Every Day    Packs/day: 0.50    Types: Cigarettes   Smokeless tobacco: Never  Vaping Use   Vaping Use: Never used  Substance and Sexual Activity   Alcohol use: No   Drug use: Yes    Frequency: 2.0 times per week    Types: Marijuana   Sexual activity: Not on file  Other Topics Concern   Not on file  Social History Narrative   Not on file   Social Determinants of Health   Financial Resource Strain: Not on file  Food Insecurity: Not on file  Transportation Needs: Not on file  Physical Activity: Not on file  Stress: Not on file  Social Connections: Not on file  Intimate Partner Violence: Not on file   Family History:  Family History  Problem Relation Age of Onset   Diabetes Mother    Diabetes Other    Heart attack Other    Hypertension Other     Review of Systems: Constitutional: Doesn't report fevers, chills or abnormal weight loss Eyes: Doesn't report blurriness of vision Ears, nose, mouth, throat, and face: Doesn't report sore throat Respiratory: Doesn't report cough, dyspnea or wheezes Cardiovascular: Doesn't report palpitation, chest discomfort  Gastrointestinal:  Doesn't report nausea, constipation, diarrhea GU: Doesn't report incontinence Skin: Doesn't report skin rashes Neurological: Per HPI Musculoskeletal: Doesn't report joint pain Behavioral/Psych: Doesn't report anxiety  Physical  Exam: Vitals:   05/24/21 1348  BP: 97/65  Pulse: (!) 51  Resp: 16  Temp: 98.1 F (36.7 C)  SpO2: 100%   KPS: 90. General: Alert, cooperative, pleasant, in no acute distress Head: Craniotomy scar w/ staples EENT: No conjunctival injection or scleral icterus.  Lungs: Resp effort normal Cardiac: Regular rate Abdomen: Non-distended abdomen Skin: No rashes cyanosis or petechiae. Extremities: No clubbing or edema  Neurologic Exam: Mental Status: Awake, alert, attentive to examiner. Oriented to self and environment. Language is fluent with intact comprehension.  Cranial Nerves: Visual acuity is grossly normal. Visual fields are full. Extra-ocular movements intact. No ptosis. Face is symmetric Motor: Tone and bulk are normal. Power is full in both arms and legs. Reflexes are symmetric, no pathologic reflexes present.  Sensory: Intact to light touch Gait: Normal.   Labs: I have reviewed the data as listed    Component Value Date/Time   NA 140 05/19/2021 2110   NA 141 01/01/2021 0939   K 4.0 05/19/2021 2110   CL 108 05/19/2021 2110   CO2 23 05/19/2021 2110   GLUCOSE 193 (H) 05/19/2021 2110   BUN 20  05/19/2021 2110   BUN 12 01/01/2021 0939   CREATININE 0.99 05/19/2021 2110   CREATININE 1.00 03/29/2021 1427   CREATININE 0.80 12/16/2016 1603   CALCIUM 9.5 05/19/2021 2110   PROT 7.3 05/19/2021 2110   ALBUMIN 4.5 05/19/2021 2110   AST 25 05/19/2021 2110   AST 15 03/29/2021 1427   ALT 25 05/19/2021 2110   ALT 14 03/29/2021 1427   ALKPHOS 57 05/19/2021 2110   BILITOT 1.6 (H) 05/19/2021 2110   BILITOT 0.8 03/29/2021 1427   GFRNONAA >60 05/19/2021 2110   GFRNONAA >60 03/29/2021 1427   GFRAA 107 01/01/2021 0939   Lab Results  Component Value Date   WBC 5.6 05/24/2021   NEUTROABS 3.7 05/24/2021   HGB 14.0 05/24/2021   HCT 40.7 05/24/2021   MCV 92.1 05/24/2021   PLT 191 05/24/2021     Assessment/Plan Oligodendroglioma, IDH gene mutant and 1p/19q-codeleted ([place])  [C71.9]  Troy Leon is clinically stable today, now having first cycle of adjuvant Temodar.    Because of persistent GI effects, plus mood/irritability, we will defer resumption of chemotherapy until these symptoms improve.  We discussed more liberal use of anti-emetics, BRAT diet.  CT A/P demonstrated possible mild colitis.  For irritability due to Keppra, will transition to Lamictal.  He will dose $RemoveB'25mg'NQPWemCz$  daily x7 days, then $RemoveBe'50mg'ldBgwiWeL$  daily x7 days, then $RemoveBe'100mg'snfSTjjsr$  daily and STOP keppra at that time.  Troy Leon will revisit with [place] via phone in 3 weeks to assess candidacy for second cycle of Temodar.  All questions were answered. The patient knows to call the clinic with any problems, questions or concerns. No barriers to learning were detected.  I have spent a total of 40 minutes of face-to-face and non-face-to-face time, excluding clinical staff time, preparing to see patient, ordering tests and/or medications, counseling the patient, and independently interpreting results and communicating results to the patient/family/caregiver    Ventura Sellers, MD Medical Director of Neuro-Oncology [facility] 05/24/21 1:53 PM

## 2021-05-28 ENCOUNTER — Other Ambulatory Visit (HOSPITAL_COMMUNITY): Payer: Self-pay

## 2021-05-28 ENCOUNTER — Other Ambulatory Visit: Payer: Self-pay | Admitting: Internal Medicine

## 2021-05-28 ENCOUNTER — Other Ambulatory Visit: Payer: Self-pay

## 2021-05-28 ENCOUNTER — Ambulatory Visit
Admission: RE | Admit: 2021-05-28 | Discharge: 2021-05-28 | Disposition: A | Discharge status: Home / Self Care | Payer: Medicaid Other | Source: Ambulatory Visit | Attending: Radiation Oncology | Admitting: Radiation Oncology

## 2021-05-28 DIAGNOSIS — C719 Malignant neoplasm of brain, unspecified: Secondary | ICD-10-CM

## 2021-05-28 NOTE — Progress Notes (Signed)
  Radiation Oncology         [phone]) [phone] ________________________________  Name: Troy Leon MRN: [id number]  Date of Service: 05/28/2021  DOB: 15-May-1971  Post Treatment Telephone Note  Diagnosis:   Oligodendroglioma, IDH gene mutant and 1p/19q-codeleted  Interval Since Last Radiation:  9 weeks   02/19/2021 through 03/30/2021 Site Technique Total Dose (Gy) Dose per Fx (Gy) Completed Fx Beam Energies  Brain: Brain IMRT 46/46 2 23/23 6X  Brain: Brain_Bst IMRT 14/14 2 7/7 6X    Narrative:  The patient was contacted today for routine follow-up. During treatment he did very well with radiotherapy and did not have significant desquamation. His wife answers my call and states that since some changes in keppra and transitioning to lamictal last week under the care of Dr. Mickeal Skinner, his GI symptoms have improved.   Impression/Plan: 1. Oligodendroglioma, IDH gene mutant and 1p/19q-codeleted. The patient has been doing well since completion of radiotherapy. We discussed that we would be happy to continue to follow him as needed, but he will also continue to follow up with Dr. Mickeal Skinner in medical oncology.      Carola Rhine, PAC

## 2021-05-28 NOTE — Telephone Encounter (Signed)
Due to GI symptoms chemo is on hold

## 2021-05-29 ENCOUNTER — Other Ambulatory Visit (HOSPITAL_COMMUNITY): Payer: Self-pay

## 2021-06-04 DIAGNOSIS — C719 Malignant neoplasm of brain, unspecified: Secondary | ICD-10-CM | POA: Diagnosis not present

## 2021-06-11 ENCOUNTER — Telehealth: Payer: Self-pay | Admitting: Internal Medicine

## 2021-06-11 NOTE — Telephone Encounter (Signed)
Called patient regarding upcoming appointments, patient is notified. 

## 2021-06-14 ENCOUNTER — Inpatient Hospital Stay: Payer: Medicaid Other | Attending: Radiation Oncology | Admitting: Internal Medicine

## 2021-06-14 ENCOUNTER — Other Ambulatory Visit (HOSPITAL_COMMUNITY): Payer: Self-pay

## 2021-06-14 DIAGNOSIS — C719 Malignant neoplasm of brain, unspecified: Secondary | ICD-10-CM | POA: Diagnosis not present

## 2021-06-14 MED ORDER — TEMOZOLOMIDE 140 MG PO CAPS
280.0000 mg | ORAL_CAPSULE | Freq: Every day | ORAL | 0 refills | Status: DC
  Filled 2021-06-14: qty 10, 5d supply, fill #0

## 2021-06-14 NOTE — Progress Notes (Signed)
I connected with Troy Leon on 06/14/21 at 12:30 PM EDT by telephone visit and verified that I am speaking with the correct person using two identifiers.  I discussed the limitations, risks, security and privacy concerns of performing an evaluation and management service by telemedicine and the availability of in-person appointments. I also discussed with the patient that there may be a patient responsible charge related to this service. The patient expressed understanding and agreed to proceed.  Other persons participating in the visit and their role in the encounter:  n/a  Patient's location:  Home  Provider's location:  Office  Chief Complaint:  Oligodendroglioma, IDH gene mutant and 1p/19q-codeleted ([place]) - Plan: MR BRAIN W [employer] grade 3 oligodendroglioma ([place]) - Plan: temozolomide (TEMODAR) 140 MG capsule  History of Present Ilness: Troy Leon describes improvement in nausea/vomiting and mood symptoms since replacing the Keppra with Lamictal.  No seizures, no side effects, no rash.  He denies new or progressive symptoms.  Feels ready to dose cycle #2. Observations: Language and cognition at baseline Assessment and Plan: Oligodendroglioma, IDH gene mutant and 1p/19q-codeleted ([place]) - Plan: MR BRAIN W WO CONTRAST  [employer] grade 3 oligodendroglioma ([place]) - Plan: temozolomide (TEMODAR) 140 MG capsule  We recommended continuing treatment with cycle #2 Temozolomide 150 mg/m2, on for five days and off for twenty three days in twenty eight day cycles. The patient will have a complete blood count performed on days 21 and 28 of each cycle, and a comprehensive metabolic panel performed on day 28 of each cycle. Labs may need to be performed more often. Zofran will prescribed for home use for nausea/vomiting.   Chemotherapy should be held for the following:  ANC less than 1,000  Platelets less than 100,000  LFT or creatinine greater than 2x  ULN  If clinical concerns/contraindications develop  Follow Up Instructions: RTC in 1 month with MRI brain prior to cycle #3.  I discussed the assessment and treatment plan with the patient.  The patient was provided an opportunity to ask questions and all were answered.  The patient agreed with the plan and demonstrated understanding of the instructions.    The patient was advised to call back or seek an in-person evaluation if the symptoms worsen or if the condition fails to improve as anticipated.  I provided 5-10 minutes of non-face-to-face time during this enocunter.  Ventura Sellers, MD   I provided 15 minutes of non face-to-face telephone visit time during this encounter, and > 50% was spent counseling as documented under my assessment & plan.

## 2021-06-15 ENCOUNTER — Other Ambulatory Visit (HOSPITAL_COMMUNITY): Payer: Self-pay

## 2021-07-02 ENCOUNTER — Other Ambulatory Visit (HOSPITAL_COMMUNITY): Payer: Self-pay

## 2021-07-10 ENCOUNTER — Other Ambulatory Visit: Payer: Self-pay | Admitting: Radiation Therapy

## 2021-07-18 ENCOUNTER — Other Ambulatory Visit (HOSPITAL_COMMUNITY): Payer: Self-pay

## 2021-07-18 ENCOUNTER — Ambulatory Visit (HOSPITAL_COMMUNITY)
Admission: RE | Admit: 2021-07-18 | Discharge: 2021-07-18 | Disposition: A | Discharge status: Home / Self Care | Payer: Medicaid Other | Source: Ambulatory Visit | Attending: Internal Medicine | Admitting: Internal Medicine

## 2021-07-18 ENCOUNTER — Other Ambulatory Visit: Payer: Self-pay

## 2021-07-18 DIAGNOSIS — D496 Neoplasm of unspecified behavior of brain: Secondary | ICD-10-CM | POA: Diagnosis not present

## 2021-07-18 DIAGNOSIS — C711 Malignant neoplasm of frontal lobe: Secondary | ICD-10-CM | POA: Diagnosis not present

## 2021-07-18 DIAGNOSIS — C719 Malignant neoplasm of brain, unspecified: Secondary | ICD-10-CM | POA: Diagnosis not present

## 2021-07-18 IMAGING — MR MR HEAD WO/W CM
15 of 16 series · 39 of 48 positions shown · IV contrast (gadavist)
Comparison: [DATE]

CLINICAL DATA: Right frontal oligodendroglioma post surgery and
radiation

EXAM:
MRI HEAD WITHOUT AND WITH CONTRAST
TECHNIQUE: Multiplanar, multiecho pulse sequences of the brain and surrounding
structures were obtained without and with intravenous contrast.
CONTRAST:  6mL GADAVIST GADOBUTROL 1 MMOL/ML IV SOLN

[Series 5: DWI · axial · 3.0mm · 0.77mm/px · z∈[-56,+91]mm · 2 of 50 slices shown (1 of 5)]
[im 1/50]
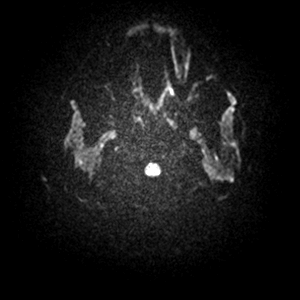
[im 50/50]
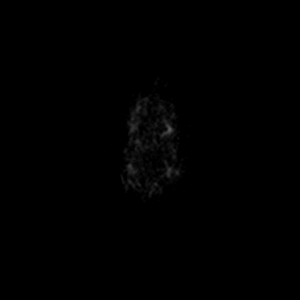

[Series 6: DWI · axial · 3.0mm · 0.77mm/px · z∈[-56,+91]mm · 3 of 50 slices shown (2 of 5)]
[im 1/50]
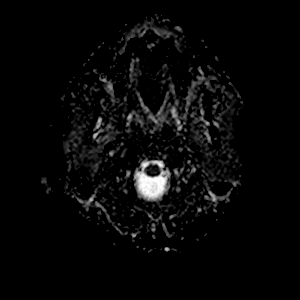
[im 25/50]
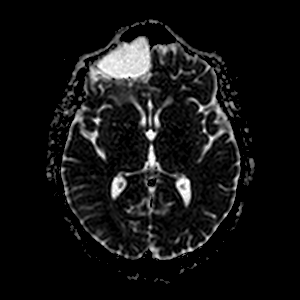
[im 50/50]
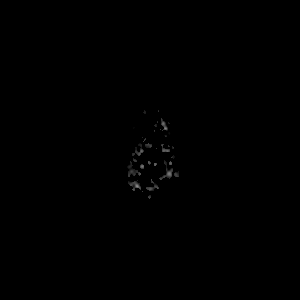

[Series 7: DWI · coronal · 5.0mm · 0.88mm/px · 2 of 28 slices shown (3 of 5)]
[im 1/28]
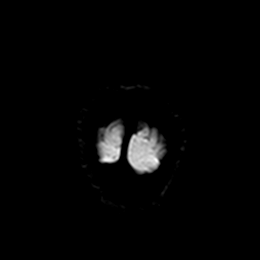
[im 28/28]
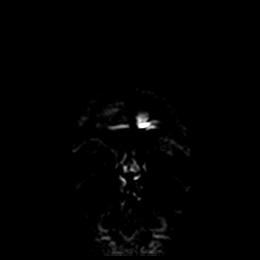

[Series 7: DWI · coronal · 5.0mm · 0.88mm/px · 2 of 28 slices shown (4 of 5)]
[im 1/28]
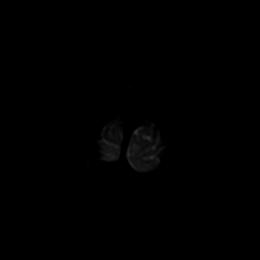
[im 28/28]
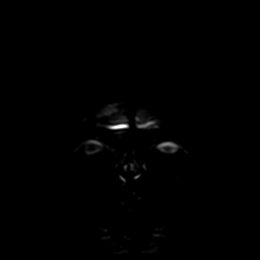

[Series 8: DWI · coronal · 5.0mm · 0.88mm/px · 2 of 28 slices shown (5 of 5)]
[im 1/28]
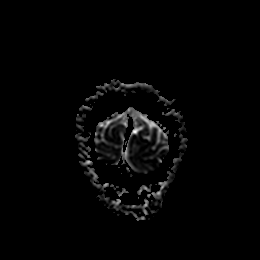
[im 28/28]
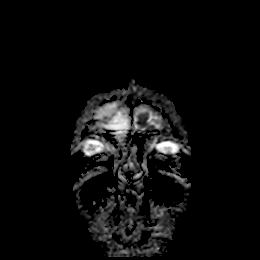

[Series 9: T1 · sagittal · 5.0mm · 0.75mm/px · 1 of 21 slices shown (1 of 2)]
[im 1/21]
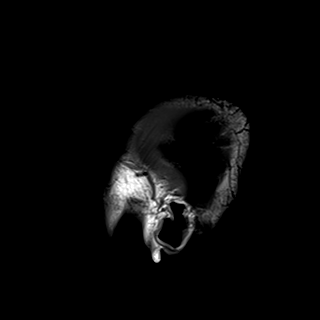

[Series 10: T2 · axial · 5.0mm · 0.72mm/px · 1 of 23 slices shown (1 of 2)]
[im 1/23]
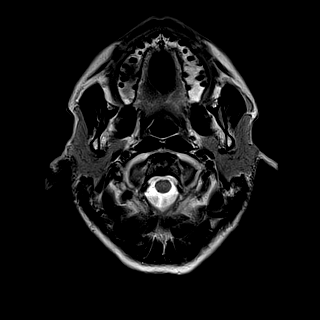

[Series 11: mag_images · axial · 3.0mm · 0.90mm/px · z∈[-71,+106]mm · 3 of 60 slices shown]
[im 1/60]
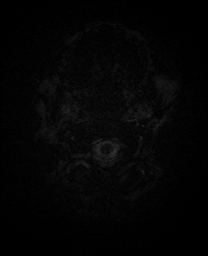
[im 30/60]
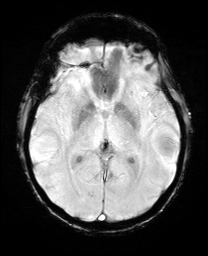
[im 60/60]
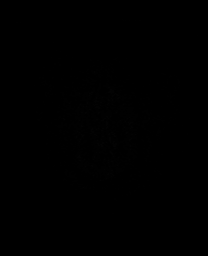

[Series 12: pha_images · axial · 3.0mm · 0.90mm/px · z∈[-71,+106]mm · 3 of 60 slices shown]
[im 1/60]
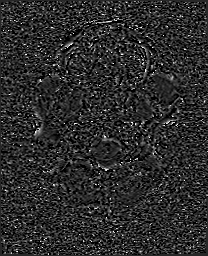
[im 30/60]
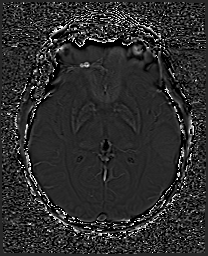
[im 60/60]
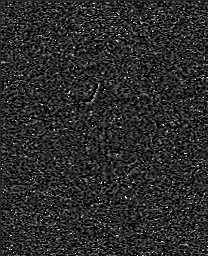

[Series 13: swi_images · axial · 3.0mm · 0.90mm/px · z∈[-71,+106]mm · 3 of 60 slices shown]
[im 1/60]
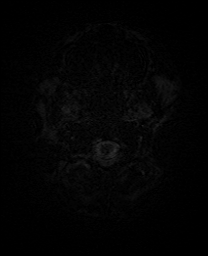
[im 30/60]
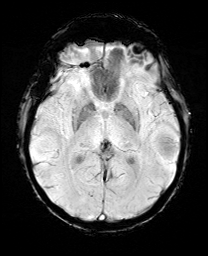
[im 60/60]
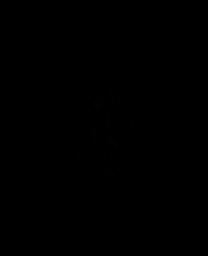

[Series 15: FLAIR · axial · 3.0mm · 0.45mm/px · z∈[-56,+91]mm · 3 of 50 slices shown]
[im 1/50]
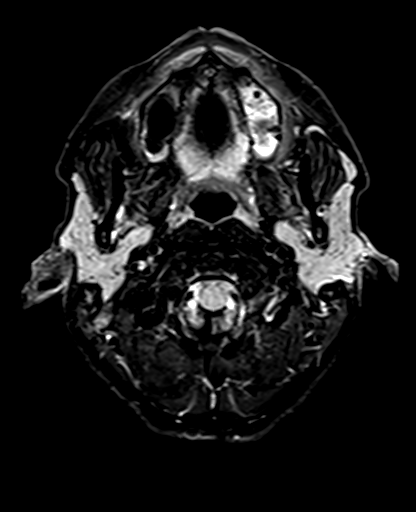
[im 25/50]
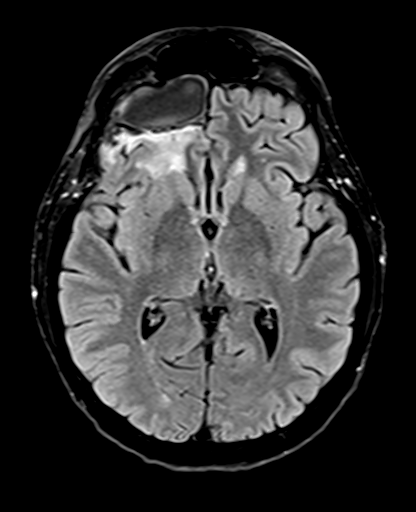
[im 50/50]
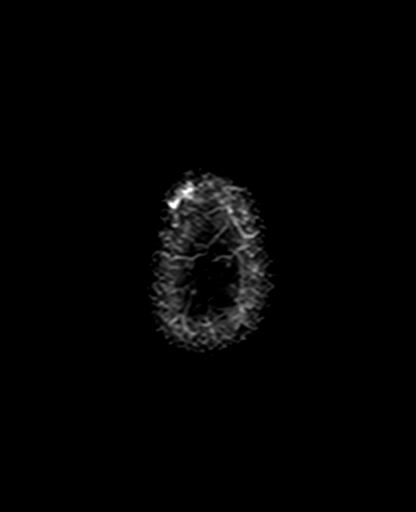

[Series 16: T1 · axial · 1.0mm · 0.98mm/px · z∈[-70,+105]mm · 9 of 176 slices shown (2 of 2)]
[im 1/176]
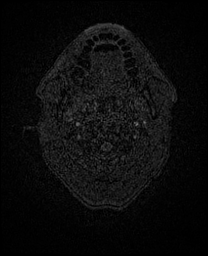
[im 22/176]
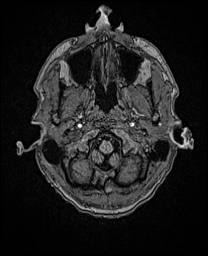
[im 44/176]
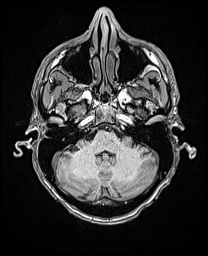
[im 66/176]
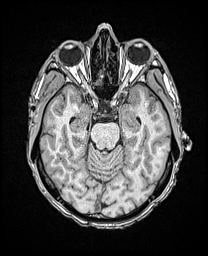
[im 88/176]
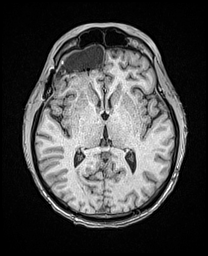
[im 110/176]
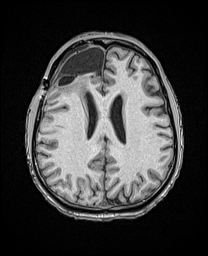
[im 132/176]
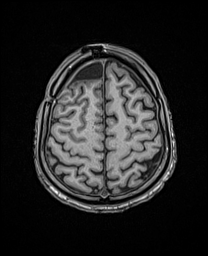
[im 154/176]
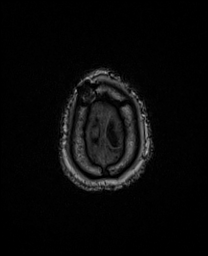
[im 176/176]
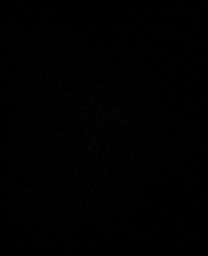

[Series 17: T2 · coronal · 5.0mm · 0.72mm/px · 2 of 30 slices shown (2 of 2)]
[im 1/30]
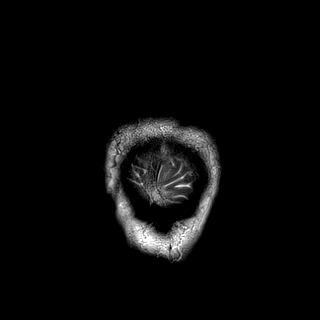
[im 30/30]
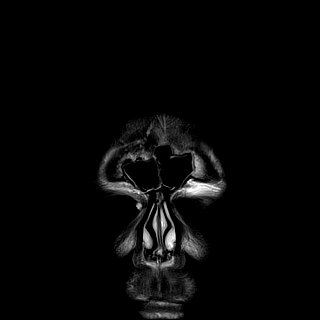

[Series 19: T1 post-contrast · coronal · 5.0mm · 0.34mm/px · 2 of 30 slices shown (1 of 2)]
[im 1/30]
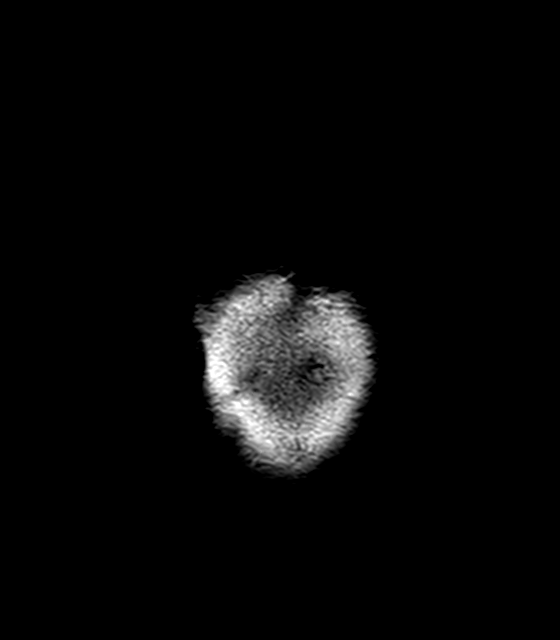
[im 30/30]
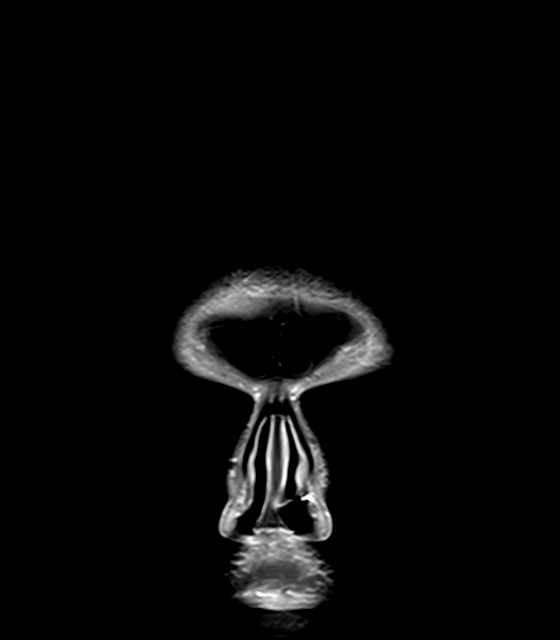

[Series 20: T1 post-contrast · sagittal · 5.0mm · 0.75mm/px · 1 of 21 slices shown (2 of 2)]
[im 1/21]
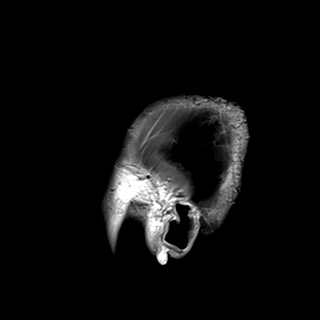

[39 of 48 positions shown; findings below may reference images not displayed]

FINDINGS: Brain: Stable resection cavity with encapsulated fluid and adjacent
T2 hyperintensity (with neutral volume and diffusion isointensity to
gray matter) that extends from the white matter to the frontal horn
of the lateral ventricle. No residual enhancement, necrotic
components, or interval hemorrhage. No complicating infarct, acute
hemorrhage, hydrocephalus, or shift

Vascular: Normal flow voids.

Skull and upper cervical spine: Normal marrow signal.

Sinuses/Orbits: Negative.
IMPRESSION: Stable right frontal lobe tumor site.  No enhancing disease.

## 2021-07-18 MED ORDER — GADOBUTROL 1 MMOL/ML IV SOLN
6.0000 mL | Freq: Once | INTRAVENOUS | Status: AC | PRN
  Administered 2021-07-18: 6 mL via INTRAVENOUS

## 2021-07-19 ENCOUNTER — Inpatient Hospital Stay: Payer: Medicaid Other | Attending: Radiation Oncology

## 2021-07-19 ENCOUNTER — Inpatient Hospital Stay: Payer: Medicaid Other | Admitting: Internal Medicine

## 2021-07-19 ENCOUNTER — Other Ambulatory Visit (HOSPITAL_COMMUNITY): Payer: Self-pay

## 2021-07-19 VITALS — BP 107/70 | HR 50 | Temp 98.0°F | Resp 18 | Wt 138.4 lb

## 2021-07-19 DIAGNOSIS — Z9221 Personal history of antineoplastic chemotherapy: Secondary | ICD-10-CM | POA: Diagnosis not present

## 2021-07-19 DIAGNOSIS — F17210 Nicotine dependence, cigarettes, uncomplicated: Secondary | ICD-10-CM | POA: Diagnosis not present

## 2021-07-19 DIAGNOSIS — C719 Malignant neoplasm of brain, unspecified: Secondary | ICD-10-CM | POA: Diagnosis not present

## 2021-07-19 DIAGNOSIS — Z79899 Other long term (current) drug therapy: Secondary | ICD-10-CM | POA: Insufficient documentation

## 2021-07-19 DIAGNOSIS — C711 Malignant neoplasm of frontal lobe: Secondary | ICD-10-CM | POA: Insufficient documentation

## 2021-07-19 DIAGNOSIS — R112 Nausea with vomiting, unspecified: Secondary | ICD-10-CM | POA: Insufficient documentation

## 2021-07-19 DIAGNOSIS — Z923 Personal history of irradiation: Secondary | ICD-10-CM | POA: Diagnosis not present

## 2021-07-19 LAB — CMP (CANCER CENTER ONLY)
ALT: 11 U/L (ref 0–44)
AST: 13 U/L — ABNORMAL LOW (ref 15–41)
Albumin: 4.2 g/dL (ref 3.5–5.0)
Alkaline Phosphatase: 64 U/L (ref 38–126)
Anion gap: 9 (ref 5–15)
BUN: 15 mg/dL (ref 6–20)
CO2: 26 mmol/L (ref 22–32)
Calcium: 9.5 mg/dL (ref 8.9–10.3)
Chloride: 107 mmol/L (ref 98–111)
Creatinine: 1.02 mg/dL (ref 0.61–1.24)
GFR, Estimated: >60 mL/min (ref >60)
Glucose, Bld: 96 mg/dL (ref 70–99)
Potassium: 4.3 mmol/L (ref 3.5–5.1)
Sodium: 142 mmol/L (ref 135–145)
Total Bilirubin: 0.9 mg/dL (ref 0.3–1.2)
Total Protein: 6.8 g/dL (ref 6.5–8.1)

## 2021-07-19 LAB — CBC WITH DIFFERENTIAL (CANCER CENTER ONLY)
Abs Immature Granulocytes: 0.01 10*3/uL (ref 0.00–0.07)
Basophils Absolute: 0 10*3/uL (ref 0.0–0.1)
Basophils Relative: 1 %
Eosinophils Absolute: 0.1 10*3/uL (ref 0.0–0.5)
Eosinophils Relative: 1 %
HCT: 41.5 % (ref 39.0–52.0)
Hemoglobin: 14.1 g/dL (ref 13.0–17.0)
Immature Granulocytes: 0 %
Lymphocytes Relative: 25 %
Lymphs Abs: 1.7 10*3/uL (ref 0.7–4.0)
MCH: 31.9 pg (ref 26.0–34.0)
MCHC: 34 g/dL (ref 30.0–36.0)
MCV: 93.9 fL (ref 80.0–100.0)
Monocytes Absolute: 0.5 10*3/uL (ref 0.1–1.0)
Monocytes Relative: 7 %
Neutro Abs: 4.3 10*3/uL (ref 1.7–7.7)
Neutrophils Relative %: 66 %
Platelet Count: 165 10*3/uL (ref 150–400)
RBC: 4.42 MIL/uL (ref 4.22–5.81)
RDW: 13.1 % (ref 11.5–15.5)
WBC Count: 6.6 10*3/uL (ref 4.0–10.5)
nRBC: 0 % (ref 0.0–0.2)

## 2021-07-19 MED ORDER — TEMOZOLOMIDE 100 MG PO CAPS
100.0000 mg | ORAL_CAPSULE | Freq: Every day | ORAL | 0 refills | Status: DC
  Filled 2021-07-19 – 2021-07-23 (×2): qty 5, 5d supply, fill #0

## 2021-07-19 MED ORDER — TEMOZOLOMIDE 250 MG PO CAPS
250.0000 mg | ORAL_CAPSULE | Freq: Every day | ORAL | 0 refills | Status: DC
  Filled 2021-07-19 – 2021-07-23 (×2): qty 5, 5d supply, fill #0

## 2021-07-19 NOTE — Progress Notes (Signed)
[facility] at [place], [place] [address] [phone]   Interval Evaluation  Date of Service: 07/19/21 Patient Name: Troy Leon Patient MRN: [id number] Patient DOB: June 07, 1971 Provider: Ventura Sellers, MD  Identifying Statement:  Troy Leon is a 50 y.o. male with right frontal  oligodendroglioma grade III     Oncology History  Oligodendroglioma, IDH gene mutant and 1p/19q-codeleted ([place])  01/09/2021 Surgery   Craniotomy, right frontal resection by Dr. Reatha Armour   02/19/2021 - 03/30/2021 Radiation Therapy   IMRT and concurrent Temodar 62m/m2   04/24/2021 -  Chemotherapy   5-day Temozolomide         Biomarkers:  MGMT Unknown.  IDH 1/2 Mutated.  EGFR Unknown  1p/19q co-deleted   Interval History:  Troy DUBUQUEpresents today for follow up, now having completed second cycle of 5-day Temodar.  No issues tolerating chemo aside from some constipation.  He is still experiencing milder nausea and abdominal discomfort.  Otherwise denies new or progressive neurologic deficits, no seizures or headaches.  Wife continues to remark on irritability with Keppra.  H+P (01/26/21) Patient presented to medical attention in February 2022 with several weeks history of new onset headaches.  He describes holo-cranial pain, severe in nature, often associated with AM time period.  In addition, he describes multiple episodes of "sudden dropping, legs getting out, feeling like I'm losing contact", followed by recovery associated with a period of fatigue.  Notably, these episodes stopped following surgery/resection on 01/09/21 (Troy Leon) and initiation of anti-seizure medication.  Currently he feels well, aside from frequent burping and spasming of "esophagus".  Taking 480mof decadron three times per day since discharge.  No functional deficits otherwise.  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  Medications: Current  Outpatient Medications on File Prior to Visit  Medication Sig Dispense Refill   lamoTRIgine (LAMICTAL) 100 MG tablet Take 1 tablet (100 mg total) by mouth daily. 60 tablet 1   LORazepam (ATIVAN) 0.5 MG tablet 1 tab po 30 minutes prior to radiation (Patient not taking: No sig reported) 30 tablet 0   omeprazole (PRILOSEC) 20 MG capsule Take 1 capsule (20 mg total) by mouth 2 (two) times daily. 60 capsule 3   ondansetron (ZOFRAN) 8 MG tablet TAKE 1 TABLET BY MOUTH 2 TIMES DAILY AS NEEDED FOR NAUSEA AND VOMITING,MAY TAKE 30-60 MIN PRIOR TO TEMODAR ADMINISTRATION IF NAUSEA/VOMITING OCCURS 60 tablet 1   promethazine (PHENERGAN) 25 MG suppository Place 1 suppository (25 mg total) rectally every 6 (six) hours as needed for nausea or vomiting. (Patient not taking: Reported on 05/24/2021) 12 each 0   temozolomide (TEMODAR) 140 MG capsule Take 2 capsules (280 mg total) by mouth at bedtime. May take on an empty stomach to decrease nausea & vomiting. 10 capsule 0   No current facility-administered medications on file prior to visit.    Allergies:  Allergies  Allergen Reactions   Penicillins Other (See Comments)    Has patient had a PCN reaction causing immediate rash, facial/tongue/throat swelling, SOB or lightheadedness with hypotension: unknown Has patient had a PCN reaction causing severe rash involving mucus membranes or skin necrosis: unknown Has patient had a PCN reaction that required hospitalization unknown Has patient had a PCN reaction occurring within the last 10 years:no If all of the above answers are "NO", then may proceed wit   Past Medical History:  Past Medical History:  Diagnosis Date  Oligodendroglioma of frontal lobe ([place]) 01/2021   Pancreatitis    Past Surgical History:  Past Surgical History:  Procedure Laterality Date   APPLICATION OF CRANIAL NAVIGATION N/A 01/09/2021   Procedure: APPLICATION OF CRANIAL NAVIGATION;  Surgeon: Karsten Ro, DO;  Location: [facility];  Service:  Neurosurgery;  Laterality: N/A;   CHOLECYSTECTOMY     CRANIOTOMY N/A 01/09/2021   Procedure: CRANIOTOMY FOR TUMOR EXCISION;  Surgeon: Karsten Ro, DO;  Location: [facility];  Service: Neurosurgery;  Laterality: N/A;   Social History:  Social History   Socioeconomic History   Marital status: Married    Spouse name: Not on file   Number of children: Not on file   Years of education: Not on file   Highest education level: Not on file  Occupational History   Not on file  Tobacco Use   Smoking status: Every Day    Packs/day: 0.50    Types: Cigarettes   Smokeless tobacco: Never  Vaping Use   Vaping Use: Never used  Substance and Sexual Activity   Alcohol use: No   Drug use: Yes    Frequency: 2.0 times per week    Types: Marijuana   Sexual activity: Not on file  Other Topics Concern   Not on file  Social History Narrative   Not on file   Social Determinants of Health   Financial Resource Strain: Not on file  Food Insecurity: Not on file  Transportation Needs: Not on file  Physical Activity: Not on file  Stress: Not on file  Social Connections: Not on file  Intimate Partner Violence: Not on file   Family History:  Family History  Problem Relation Age of Onset   Diabetes Mother    Diabetes Other    Heart attack Other    Hypertension Other     Review of Systems: Constitutional: Doesn't report fevers, chills or abnormal weight loss Eyes: Doesn't report blurriness of vision Ears, nose, mouth, throat, and face: Doesn't report sore throat Respiratory: Doesn't report cough, dyspnea or wheezes Cardiovascular: Doesn't report palpitation, chest discomfort  Gastrointestinal:  Doesn't report nausea, constipation, diarrhea GU: Doesn't report incontinence Skin: Doesn't report skin rashes Neurological: Per HPI Musculoskeletal: Doesn't report joint pain Behavioral/Psych: Doesn't report anxiety  Physical Exam: Vitals:   07/19/21 1228  BP: 107/70  Pulse: (!) 50  Resp: 18   Temp: 98 F (36.7 C)  SpO2: 100%   KPS: 90. General: Alert, cooperative, pleasant, in no acute distress Head: Craniotomy scar w/ staples EENT: No conjunctival injection or scleral icterus.  Lungs: Resp effort normal Cardiac: Regular rate Abdomen: Non-distended abdomen Skin: No rashes cyanosis or petechiae. Extremities: No clubbing or edema  Neurologic Exam: Mental Status: Awake, alert, attentive to examiner. Oriented to self and environment. Language is fluent with intact comprehension.  Cranial Nerves: Visual acuity is grossly normal. Visual fields are full. Extra-ocular movements intact. No ptosis. Face is symmetric Motor: Tone and bulk are normal. Power is full in both arms and legs. Reflexes are symmetric, no pathologic reflexes present.  Sensory: Intact to light touch Gait: Normal.   Labs: I have reviewed the data as listed    Component Value Date/Time   NA 144 05/24/2021 1325   NA 141 01/01/2021 0939   K 4.2 05/24/2021 1325   CL 107 05/24/2021 1325   CO2 29 05/24/2021 1325   GLUCOSE 108 (H) 05/24/2021 1325   BUN 13 05/24/2021 1325   BUN 12 01/01/2021 0939   CREATININE 0.85  05/24/2021 1325   CREATININE 0.80 12/16/2016 1603   CALCIUM 9.0 05/24/2021 1325   PROT 6.4 (L) 05/24/2021 1325   ALBUMIN 3.7 05/24/2021 1325   AST 14 (L) 05/24/2021 1325   ALT 18 05/24/2021 1325   ALKPHOS 57 05/24/2021 1325   BILITOT 0.7 05/24/2021 1325   GFRNONAA >60 05/24/2021 1325   GFRAA 107 01/01/2021 0939   Lab Results  Component Value Date   WBC 5.6 05/24/2021   NEUTROABS 3.7 05/24/2021   HGB 14.0 05/24/2021   HCT 40.7 05/24/2021   MCV 92.1 05/24/2021   PLT 191 05/24/2021   Imaging:  Rich Clinician Interpretation: I have personally reviewed the CNS images as listed.  My interpretation, in the context of the patient's clinical presentation, is stable disease  MR BRAIN W WO CONTRAST  Result Date: 07/18/2021 CLINICAL DATA:  Right frontal oligodendroglioma post surgery and  radiation EXAM: MRI HEAD WITHOUT AND WITH CONTRAST TECHNIQUE: Multiplanar, multiecho pulse sequences of the brain and surrounding structures were obtained without and with intravenous contrast. CONTRAST:  67m GADAVIST GADOBUTROL 1 MMOL/ML IV SOLN COMPARISON:  04/19/2021 FINDINGS: Brain: Stable resection cavity with encapsulated fluid and adjacent T2 hyperintensity (with neutral volume and diffusion isointensity to gray matter) that extends from the white matter to the frontal horn of the lateral ventricle. No residual enhancement, necrotic components, or interval hemorrhage. No complicating infarct, acute hemorrhage, hydrocephalus, or shift Vascular: Normal flow voids. Skull and upper cervical spine: Normal marrow signal. Sinuses/Orbits: Negative. IMPRESSION: Stable right frontal lobe tumor site.  No enhancing disease. Electronically Signed   By: JJorje GuildM.D.   On: 07/18/2021 21:08    Assessment/Plan Oligodendroglioma, IDH gene mutant and 1p/19q-codeleted ([place] [C71.9]  RLODEN LAURENTis clinically and radiographically stable today, now having completed 2 cycles of adjuvant Temodar.    We recommended continuing treatment with cycle #3 Temozolomide, dose increased to 200 mg/m2, on for five days and off for twenty three days in twenty eight day cycles. The patient will have a complete blood count performed on days 21 and 28 of each cycle, and a comprehensive metabolic panel performed on day 28 of each cycle. Labs may need to be performed more often. Zofran will prescribed for home use for nausea/vomiting.   Chemotherapy should be held for the following:  ANC less than 1,000  Platelets less than 100,000  LFT or creatinine greater than 2x ULN  If clinical concerns/contraindications develop  For seizures, will con't Lamictal 1073mdaily as prior.  We ask that RoCyndia Benteturn to clinic in 1 months with labs prior to cycle #4, or sooner as needed.  All questions were answered. The  patient knows to call the clinic with any problems, questions or concerns. No barriers to learning were detected.  I have spent a total of 30 minutes of face-to-face and non-face-to-face time, excluding clinical staff time, preparing to see patient, ordering tests and/or medications, counseling the patient, and independently interpreting results and communicating results to the patient/family/caregiver    ZaVentura SellersMD Medical Director of Neuro-Oncology [facility] WeMartinez9/08/22 12:27 PM

## 2021-07-23 ENCOUNTER — Other Ambulatory Visit (HOSPITAL_COMMUNITY): Payer: Self-pay

## 2021-07-23 ENCOUNTER — Inpatient Hospital Stay: Payer: Medicaid Other

## 2021-07-27 ENCOUNTER — Other Ambulatory Visit (HOSPITAL_COMMUNITY): Payer: Self-pay

## 2021-07-31 ENCOUNTER — Telehealth: Payer: Self-pay | Admitting: Internal Medicine

## 2021-07-31 NOTE — Telephone Encounter (Signed)
Sch per 9/8 los, left msg

## 2021-08-14 ENCOUNTER — Other Ambulatory Visit: Payer: Self-pay | Admitting: Internal Medicine

## 2021-08-14 ENCOUNTER — Other Ambulatory Visit (HOSPITAL_COMMUNITY): Payer: Self-pay

## 2021-08-14 DIAGNOSIS — C719 Malignant neoplasm of brain, unspecified: Secondary | ICD-10-CM

## 2021-08-15 ENCOUNTER — Other Ambulatory Visit (HOSPITAL_COMMUNITY): Payer: Self-pay

## 2021-08-15 ENCOUNTER — Other Ambulatory Visit: Payer: Self-pay

## 2021-08-16 ENCOUNTER — Other Ambulatory Visit (HOSPITAL_COMMUNITY): Payer: Self-pay

## 2021-08-16 ENCOUNTER — Inpatient Hospital Stay (HOSPITAL_BASED_OUTPATIENT_CLINIC_OR_DEPARTMENT_OTHER): Payer: Medicaid Other | Admitting: Internal Medicine

## 2021-08-16 ENCOUNTER — Inpatient Hospital Stay: Payer: Medicaid Other | Attending: Radiation Oncology

## 2021-08-16 ENCOUNTER — Other Ambulatory Visit: Payer: Self-pay

## 2021-08-16 ENCOUNTER — Other Ambulatory Visit: Payer: Self-pay | Admitting: Internal Medicine

## 2021-08-16 VITALS — BP 108/74 | HR 74 | Temp 98.1°F | Resp 18 | Ht 67.0 in | Wt 138.6 lb

## 2021-08-16 DIAGNOSIS — C719 Malignant neoplasm of brain, unspecified: Secondary | ICD-10-CM | POA: Diagnosis not present

## 2021-08-16 DIAGNOSIS — R569 Unspecified convulsions: Secondary | ICD-10-CM | POA: Diagnosis not present

## 2021-08-16 DIAGNOSIS — C711 Malignant neoplasm of frontal lobe: Secondary | ICD-10-CM | POA: Diagnosis not present

## 2021-08-16 LAB — CBC WITH DIFFERENTIAL (CANCER CENTER ONLY)
Abs Immature Granulocytes: 0.02 10*3/uL (ref 0.00–0.07)
Basophils Absolute: 0 10*3/uL (ref 0.0–0.1)
Basophils Relative: 1 %
Eosinophils Absolute: 0.1 10*3/uL (ref 0.0–0.5)
Eosinophils Relative: 2 %
HCT: 44.5 % (ref 39.0–52.0)
Hemoglobin: 15.1 g/dL (ref 13.0–17.0)
Immature Granulocytes: 0 %
Lymphocytes Relative: 27 %
Lymphs Abs: 2 10*3/uL (ref 0.7–4.0)
MCH: 32.3 pg (ref 26.0–34.0)
MCHC: 33.9 g/dL (ref 30.0–36.0)
MCV: 95.3 fL (ref 80.0–100.0)
Monocytes Absolute: 0.5 10*3/uL (ref 0.1–1.0)
Monocytes Relative: 7 %
Neutro Abs: 4.7 10*3/uL (ref 1.7–7.7)
Neutrophils Relative %: 63 %
Platelet Count: 202 10*3/uL (ref 150–400)
RBC: 4.67 MIL/uL (ref 4.22–5.81)
RDW: 13 % (ref 11.5–15.5)
WBC Count: 7.4 10*3/uL (ref 4.0–10.5)
nRBC: 0 % (ref 0.0–0.2)

## 2021-08-16 LAB — CMP (CANCER CENTER ONLY)
ALT: 17 U/L (ref 0–44)
AST: 17 U/L (ref 15–41)
Albumin: 4.4 g/dL (ref 3.5–5.0)
Alkaline Phosphatase: 65 U/L (ref 38–126)
Anion gap: 11 (ref 5–15)
BUN: 15 mg/dL (ref 6–20)
CO2: 26 mmol/L (ref 22–32)
Calcium: 9.6 mg/dL (ref 8.9–10.3)
Chloride: 107 mmol/L (ref 98–111)
Creatinine: 0.91 mg/dL (ref 0.61–1.24)
GFR, Estimated: >60 mL/min (ref >60)
Glucose, Bld: 93 mg/dL (ref 70–99)
Potassium: 4.3 mmol/L (ref 3.5–5.1)
Sodium: 144 mmol/L (ref 135–145)
Total Bilirubin: 0.8 mg/dL (ref 0.3–1.2)
Total Protein: 7.2 g/dL (ref 6.5–8.1)

## 2021-08-16 MED ORDER — TEMOZOLOMIDE 100 MG PO CAPS
100.0000 mg | ORAL_CAPSULE | Freq: Every day | ORAL | 0 refills | Status: DC
  Filled 2021-08-16: qty 5, 28d supply, fill #0

## 2021-08-16 MED ORDER — TEMOZOLOMIDE 250 MG PO CAPS
250.0000 mg | ORAL_CAPSULE | Freq: Every day | ORAL | 0 refills | Status: DC
  Filled 2021-08-16: qty 5, 5d supply, fill #0

## 2021-08-16 NOTE — Progress Notes (Signed)
[facility] at [place], [place] [address] [phone]   Interval Evaluation  Date of Service: 08/16/21 Patient Name: Troy Leon Patient MRN: [id number] Patient DOB: 1971-11-06 Provider: Ventura Sellers, MD  Identifying Statement:  Troy Leon is a 50 y.o. male with right frontal  oligodendroglioma grade III     Oncology History  Oligodendroglioma, IDH gene mutant and 1p/19q-codeleted ([place])  01/09/2021 Surgery   Craniotomy, right frontal resection by Dr. Reatha Armour   02/19/2021 - 03/30/2021 Radiation Therapy   IMRT and concurrent Temodar 75mg /m2   04/24/2021 -  Chemotherapy   5-day Temozolomide         Biomarkers:  MGMT Unknown.  IDH 1/2 Mutated.  EGFR Unknown  1p/19q co-deleted   Interval History:  Troy Leon presents today for follow up, now having completed third cycle of 5-day Temodar.  No issues tolerating chemo, but does describe some difficulty swallowing the large pills.  No nausea this month.  Otherwise denies new or progressive neurologic deficits, no seizures or headaches.  Continues with Keppra.  H+P (01/26/21) Patient presented to medical attention in February 2022 with several weeks history of new onset headaches.  He describes holo-cranial pain, severe in nature, often associated with AM time period.  In addition, he describes multiple episodes of "sudden dropping, legs getting out, feeling like I'm losing contact", followed by recovery associated with a period of fatigue.  Notably, these episodes stopped following surgery/resection on 01/09/21 ([name]) and initiation of anti-seizure medication.  Currently he feels well, aside from frequent burping and spasming of "esophagus".  Taking 4mg  of decadron three times per day since discharge.  No functional deficits otherwise.  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  Medications: Current Outpatient Medications on File Prior to Visit   Medication Sig Dispense Refill   lamoTRIgine (LAMICTAL) 100 MG tablet Take 1 tablet (100 mg total) by mouth daily. 60 tablet 1   LORazepam (ATIVAN) 0.5 MG tablet 1 tab po 30 minutes prior to radiation (Patient not taking: No sig reported) 30 tablet 0   omeprazole (PRILOSEC) 20 MG capsule Take 1 capsule (20 mg total) by mouth 2 (two) times daily. 60 capsule 3   ondansetron (ZOFRAN) 8 MG tablet TAKE 1 TABLET BY MOUTH 2 TIMES DAILY AS NEEDED FOR NAUSEA AND VOMITING,MAY TAKE 30-60 MIN PRIOR TO TEMODAR ADMINISTRATION IF NAUSEA/VOMITING OCCURS 60 tablet 1   promethazine (PHENERGAN) 25 MG suppository Place 1 suppository (25 mg total) rectally every 6 (six) hours as needed for nausea or vomiting. (Patient not taking: No sig reported) 12 each 0   temozolomide (TEMODAR) 100 MG capsule Take 1 capsule (100 mg total) by mouth at bedtime. May take on an empty stomach to decrease nausea & vomiting. 5 capsule 0   temozolomide (TEMODAR) 140 MG capsule Take 2 capsules (280 mg total) by mouth at bedtime. May take on an empty stomach to decrease nausea & vomiting. (Patient not taking: Reported on 07/19/2021) 10 capsule 0   temozolomide (TEMODAR) 250 MG capsule Take 1 capsule (250 mg total) by mouth at bedtime. May take on an empty stomach to decrease nausea & vomiting. 5 capsule 0   No current facility-administered medications on file prior to visit.    Allergies:  Allergies  Allergen Reactions   Penicillins Other (See Comments)    Has patient had a PCN reaction causing immediate rash, facial/tongue/throat swelling, SOB or lightheadedness with hypotension: unknown Has  patient had a PCN reaction causing severe rash involving mucus membranes or skin necrosis: unknown Has patient had a PCN reaction that required hospitalization unknown Has patient had a PCN reaction occurring within the last 10 years:no If all of the above answers are "NO", then may proceed wit   Past Medical History:  Past Medical History:   Diagnosis Date   Oligodendroglioma of frontal lobe ([place]) 01/2021   Pancreatitis    Past Surgical History:  Past Surgical History:  Procedure Laterality Date   APPLICATION OF CRANIAL NAVIGATION N/A 01/09/2021   Procedure: APPLICATION OF CRANIAL NAVIGATION;  Surgeon: Karsten Ro, DO;  Location: [facility];  Service: Neurosurgery;  Laterality: N/A;   CHOLECYSTECTOMY     CRANIOTOMY N/A 01/09/2021   Procedure: CRANIOTOMY FOR TUMOR EXCISION;  Surgeon: Karsten Ro, DO;  Location: [facility];  Service: Neurosurgery;  Laterality: N/A;   Social History:  Social History   Socioeconomic History   Marital status: Married    Spouse name: Not on file   Number of children: Not on file   Years of education: Not on file   Highest education level: Not on file  Occupational History   Not on file  Tobacco Use   Smoking status: Every Day    Packs/day: 0.50    Types: Cigarettes   Smokeless tobacco: Never  Vaping Use   Vaping Use: Never used  Substance and Sexual Activity   Alcohol use: No   Drug use: Yes    Frequency: 2.0 times per week    Types: Marijuana   Sexual activity: Not on file  Other Topics Concern   Not on file  Social History Narrative   Not on file   Social Determinants of Health   Financial Resource Strain: Not on file  Food Insecurity: Not on file  Transportation Needs: Not on file  Physical Activity: Not on file  Stress: Not on file  Social Connections: Not on file  Intimate Partner Violence: Not on file   Family History:  Family History  Problem Relation Age of Onset   Diabetes Mother    Diabetes Other    Heart attack Other    Hypertension Other     Review of Systems: Constitutional: Doesn't report fevers, chills or abnormal weight loss Eyes: Doesn't report blurriness of vision Ears, nose, mouth, throat, and face: Doesn't report sore throat Respiratory: Doesn't report cough, dyspnea or wheezes Cardiovascular: Doesn't report palpitation, chest discomfort   Gastrointestinal:  Doesn't report nausea, constipation, diarrhea GU: Doesn't report incontinence Skin: Doesn't report skin rashes Neurological: Per HPI Musculoskeletal: Doesn't report joint pain Behavioral/Psych: Doesn't report anxiety  Physical Exam: Vitals:   08/16/21 1219  BP: 108/74  Pulse: 74  Resp: 18  Temp: 98.1 F (36.7 C)  SpO2: 100%    KPS: 90. General: Alert, cooperative, pleasant, in no acute distress Head: Craniotomy scar w/ staples EENT: No conjunctival injection or scleral icterus.  Lungs: Resp effort normal Cardiac: Regular rate Abdomen: Non-distended abdomen Skin: No rashes cyanosis or petechiae. Extremities: No clubbing or edema  Neurologic Exam: Mental Status: Awake, alert, attentive to examiner. Oriented to self and environment. Language is fluent with intact comprehension.  Cranial Nerves: Visual acuity is grossly normal. Visual fields are full. Extra-ocular movements intact. No ptosis. Face is symmetric Motor: Tone and bulk are normal. Power is full in both arms and legs. Reflexes are symmetric, no pathologic reflexes present.  Sensory: Intact to light touch Gait: Normal.   Labs: I have reviewed  the data as listed    Component Value Date/Time   NA 142 07/19/2021 1221   NA 141 01/01/2021 0939   K 4.3 07/19/2021 1221   CL 107 07/19/2021 1221   CO2 26 07/19/2021 1221   GLUCOSE 96 07/19/2021 1221   BUN 15 07/19/2021 1221   BUN 12 01/01/2021 0939   CREATININE 1.02 07/19/2021 1221   CREATININE 0.80 12/16/2016 1603   CALCIUM 9.5 07/19/2021 1221   PROT 6.8 07/19/2021 1221   ALBUMIN 4.2 07/19/2021 1221   AST 13 (L) 07/19/2021 1221   ALT 11 07/19/2021 1221   ALKPHOS 64 07/19/2021 1221   BILITOT 0.9 07/19/2021 1221   GFRNONAA >60 07/19/2021 1221   GFRAA 107 01/01/2021 0939   Lab Results  Component Value Date   WBC 6.6 07/19/2021   NEUTROABS 4.3 07/19/2021   HGB 14.1 07/19/2021   HCT 41.5 07/19/2021   MCV 93.9 07/19/2021   PLT 165  07/19/2021    Assessment/Plan Oligodendroglioma, IDH gene mutant and 1p/19q-codeleted ([place]) [C71.9]  Troy Leon is clinically stable today, now having completed 3 cycles of adjuvant Temodar.  No new or progressive deficits.  We recommended continuing treatment with cycle #4 Temozolomide, dose increased to 200 mg/m2, on for five days and off for twenty three days in twenty eight day cycles. The patient will have a complete blood count performed on days 21 and 28 of each cycle, and a comprehensive metabolic panel performed on day 28 of each cycle. Labs may need to be performed more often. Zofran will prescribed for home use for nausea/vomiting.   Chemotherapy should be held for the following:  ANC less than 1,000  Platelets less than 100,000  LFT or creatinine greater than 2x ULN  If clinical concerns/contraindications develop  For seizures, will con't Lamictal $RemoveBefore'100mg'[id number]$  daily as prior.  We ask that Troy Leon return to clinic in 1 months with MRI brain prior to cycle #5, or sooner as needed.  All questions were answered. The patient knows to call the clinic with any problems, questions or concerns. No barriers to learning were detected.  I have spent a total of 30 minutes of face-to-face and non-face-to-face time, excluding clinical staff time, preparing to see patient, ordering tests and/or medications, counseling the patient, and independently interpreting results and communicating results to the patient/family/caregiver    Ventura Sellers, MD Medical Director of Neuro-Oncology [facility] at [place] 08/16/21 12:12 PM

## 2021-08-20 ENCOUNTER — Telehealth: Payer: Self-pay | Admitting: Internal Medicine

## 2021-08-20 NOTE — Telephone Encounter (Signed)
Scheduled appt per 10/6 los. Pt is aware.  

## 2021-08-28 ENCOUNTER — Other Ambulatory Visit: Payer: Self-pay | Admitting: Radiation Therapy

## 2021-09-11 ENCOUNTER — Ambulatory Visit (HOSPITAL_COMMUNITY): Payer: Medicaid Other

## 2021-09-12 ENCOUNTER — Other Ambulatory Visit (HOSPITAL_COMMUNITY): Payer: Self-pay

## 2021-09-12 ENCOUNTER — Other Ambulatory Visit: Payer: Self-pay | Admitting: Internal Medicine

## 2021-09-12 ENCOUNTER — Ambulatory Visit (HOSPITAL_COMMUNITY)
Admission: RE | Admit: 2021-09-12 | Discharge: 2021-09-12 | Disposition: A | Discharge status: Home / Self Care | Payer: Medicaid Other | Source: Ambulatory Visit | Attending: Internal Medicine | Admitting: Internal Medicine

## 2021-09-12 ENCOUNTER — Other Ambulatory Visit: Payer: Self-pay

## 2021-09-12 DIAGNOSIS — C719 Malignant neoplasm of brain, unspecified: Secondary | ICD-10-CM

## 2021-09-12 IMAGING — MR MR HEAD WO/W CM
5 of 10 series · 13 of 48 positions shown · IV contrast (gadavist)
Comparison: [DATE]

CLINICAL DATA: Oligodendroglioma, assess treatment response

EXAM:
MRI HEAD WITHOUT AND WITH CONTRAST
TECHNIQUE: Multiplanar, multiecho pulse sequences of the brain and surrounding
structures were obtained without and with intravenous contrast.
CONTRAST:  6mL GADAVIST GADOBUTROL 1 MMOL/ML IV SOLN

[Series 4: FLAIR · sagittal · 5.0mm · 0.23mm/px · 2 of 24 slices shown (1 of 2)]
[im 1/24]
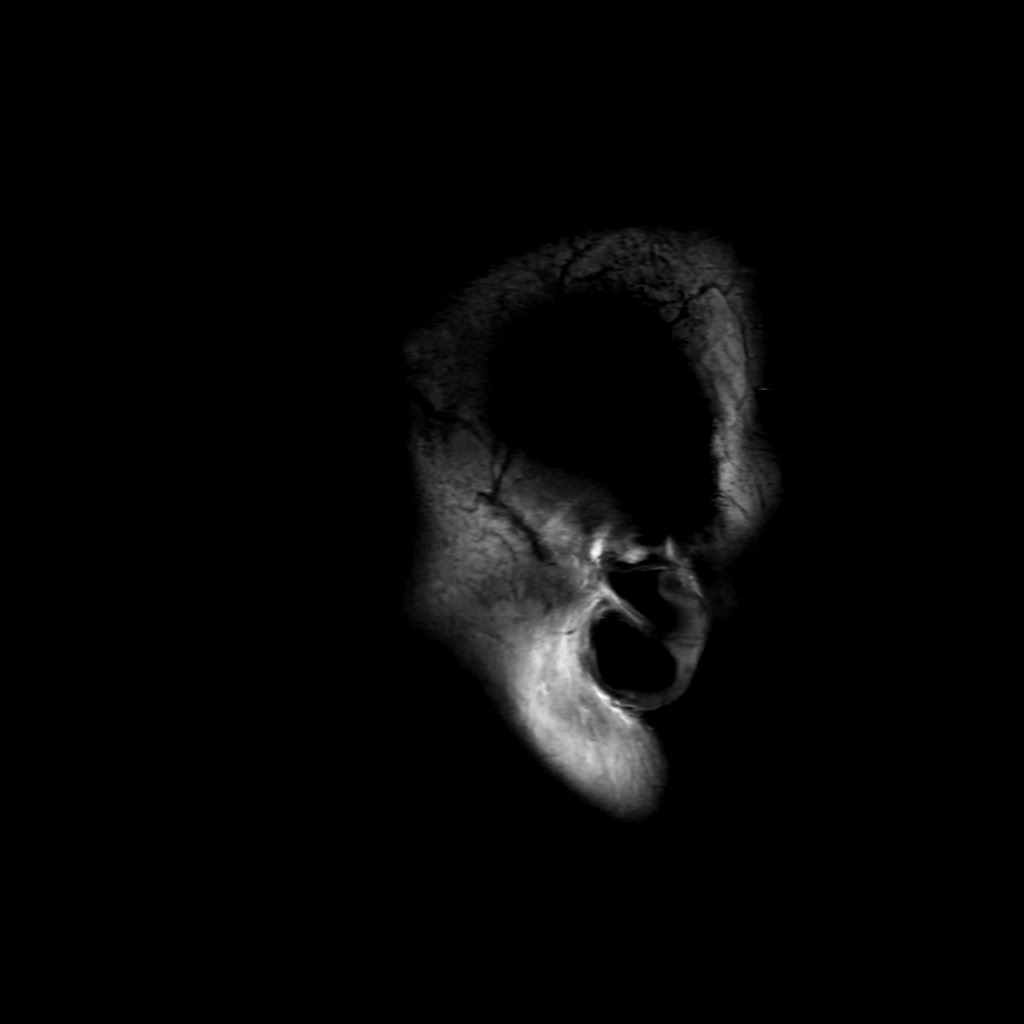
[im 24/24]
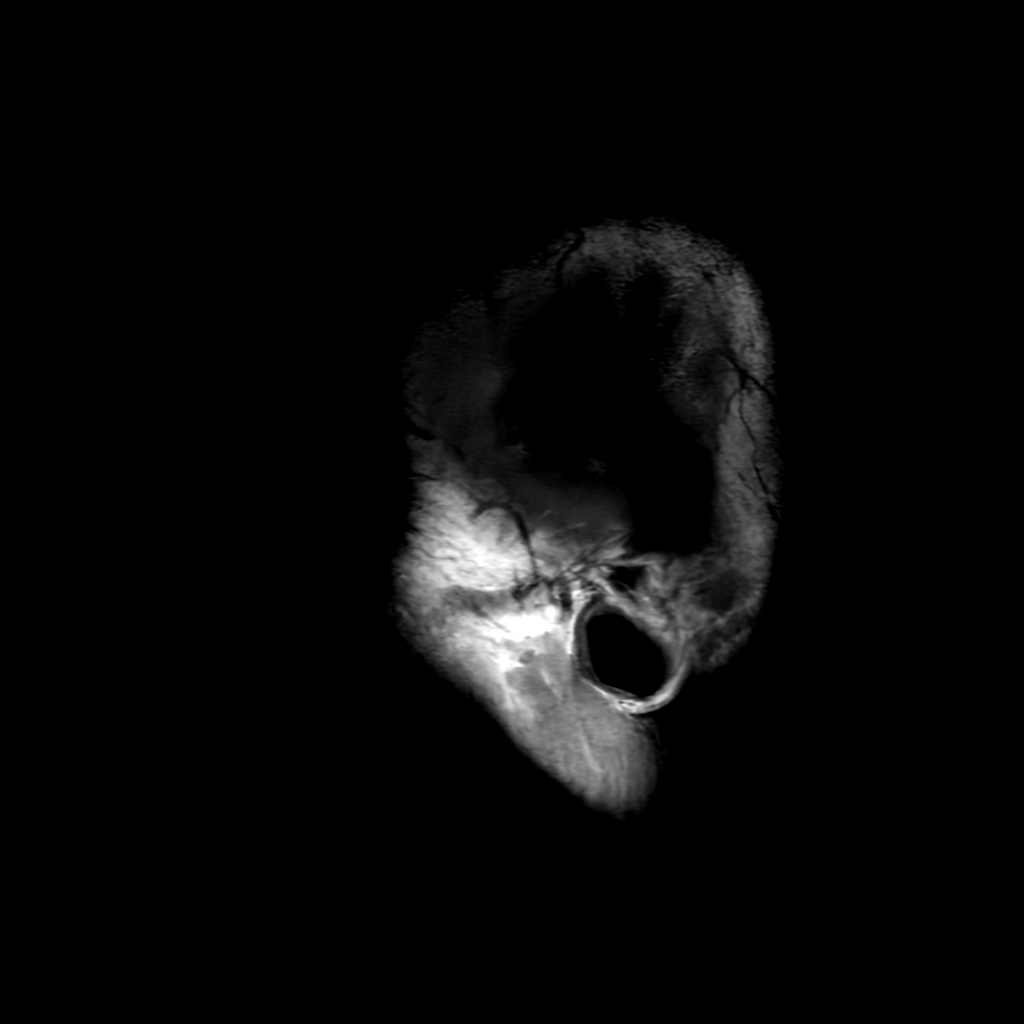

[Series 5: T2 · axial · 5.0mm · 0.23mm/px · 1 of 28 slices shown]
[im 1/28]
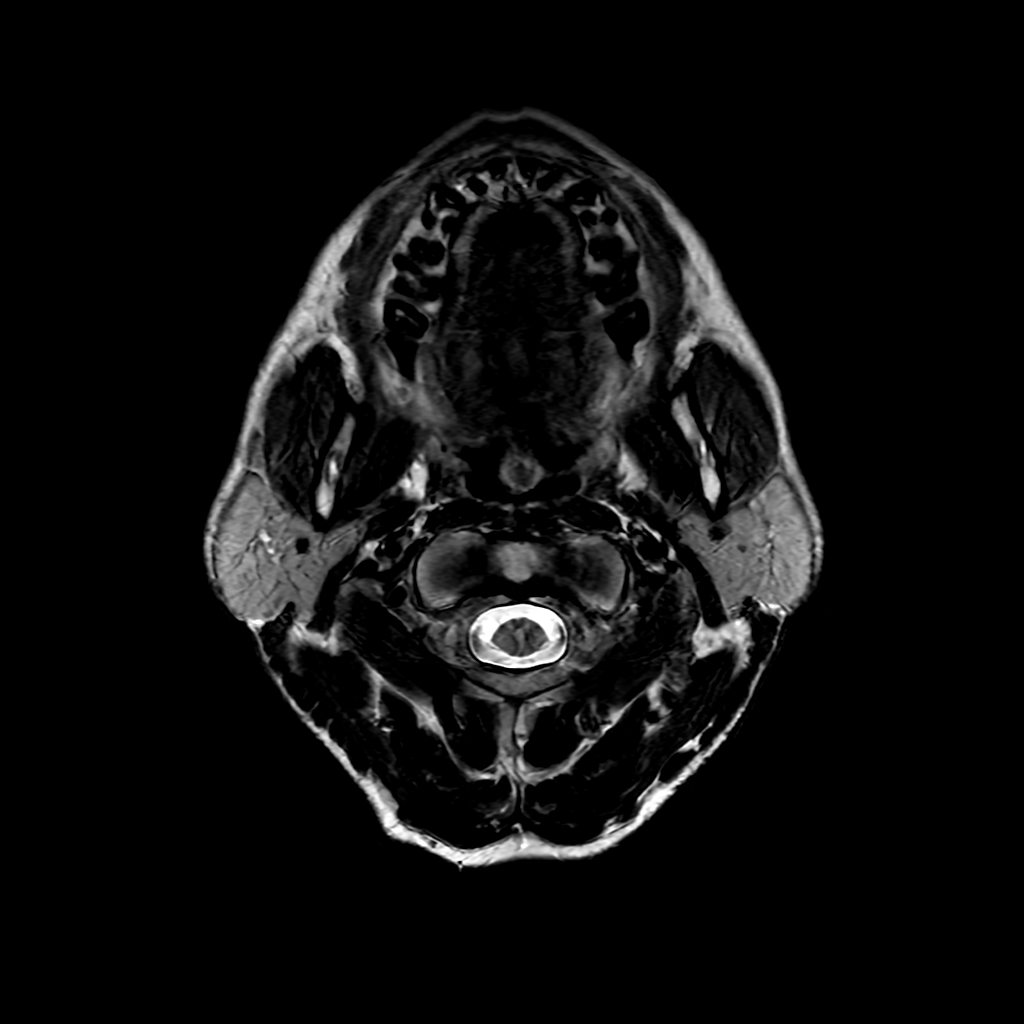

[Series 6: FLAIR · axial · 4.0mm · 0.47mm/px · z∈[-72,+89]mm · 3 of 38 slices shown (2 of 2)]
[im 1/38]
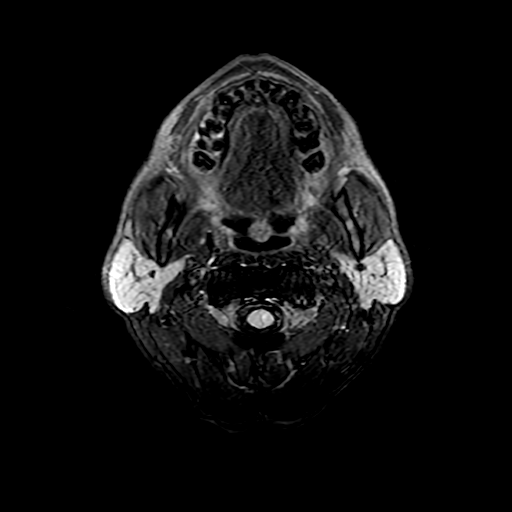
[im 19/38]
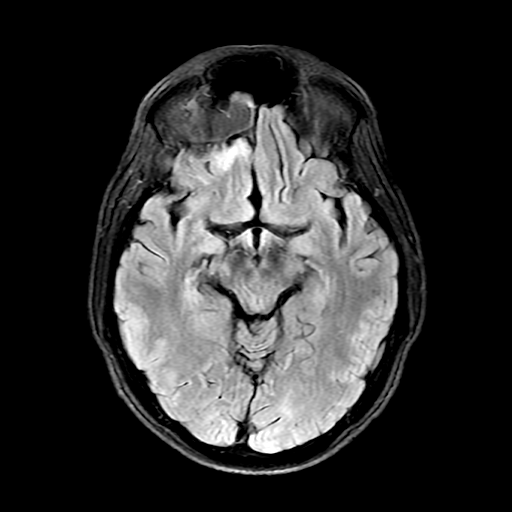
[im 38/38]
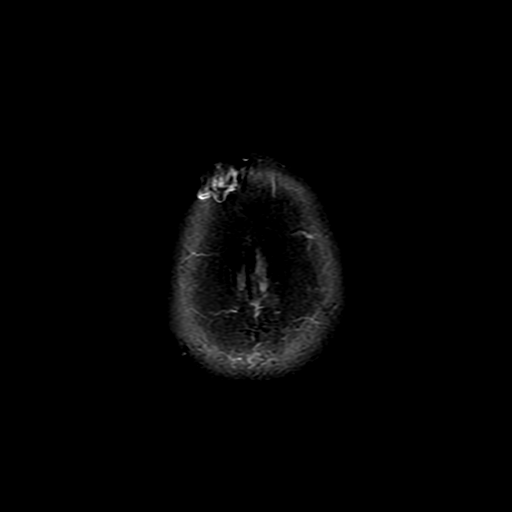

[Series 250: ADC · axial · 3.0mm · 0.94mm/px · z∈[-65,+92]mm · 4 of 54 slices shown (1 of 2)]
[im 1/54]
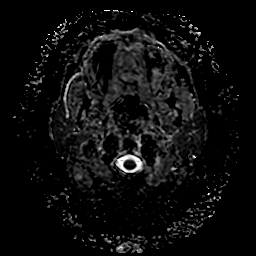
[im 18/54]
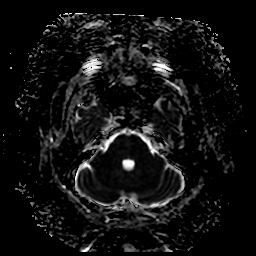
[im 36/54]
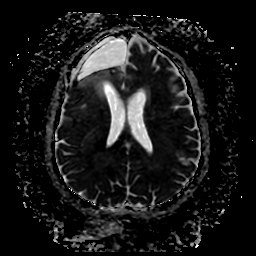
[im 54/54]
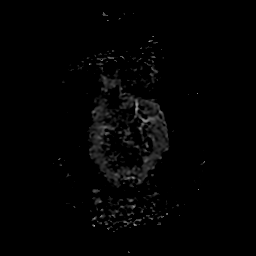

[Series 350: ADC · coronal · 4.0mm · 0.94mm/px · 3 of 39 slices shown (2 of 2)]
[im 1/39]
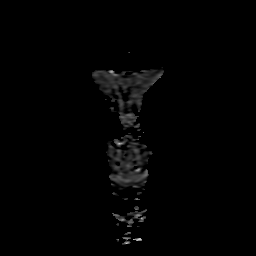
[im 20/39]
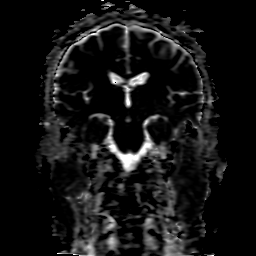
[im 39/39]
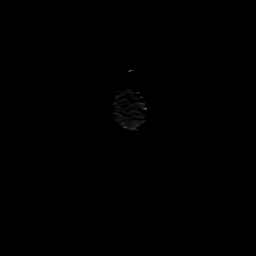

[13 of 48 positions shown; findings below may reference images not displayed]

FINDINGS: Brain: Status post right frontal craniotomy with subjacent resection
cavity, which appears grossly unchanged, with encapsulated fluid and
adjacent T2 hyperintense signal. No new or nodular enhancement. No
acute hemorrhage, infarct, mass effect, or midline shift. Ex vacuo
dilatation of the frontal horn of the right lateral ventricle.

Vascular: Normal flow voids.

Skull and upper cervical spine: Status post right frontal
craniotomy. Otherwise normal marrow signal.

Sinuses/Orbits: Negative.

Other: The mastoids are well aerated.
IMPRESSION: Stable appearance of the right frontal resection cavity. No evidence
of residual or recurrent tumor.

## 2021-09-12 MED ORDER — TEMOZOLOMIDE 100 MG PO CAPS
100.0000 mg | ORAL_CAPSULE | Freq: Every day | ORAL | 0 refills | Status: DC
  Filled 2021-09-12: qty 5, 5d supply, fill #0

## 2021-09-12 MED ORDER — GADOBUTROL 1 MMOL/ML IV SOLN
6.0000 mL | Freq: Once | INTRAVENOUS | Status: AC | PRN
  Administered 2021-09-12: 6 mL via INTRAVENOUS

## 2021-09-12 MED ORDER — TEMOZOLOMIDE 250 MG PO CAPS
250.0000 mg | ORAL_CAPSULE | Freq: Every day | ORAL | 0 refills | Status: DC
  Filled 2021-09-12: qty 5, 5d supply, fill #0

## 2021-09-12 NOTE — Telephone Encounter (Signed)
Per office note, dose increased. Gardiner Rhyme, RN

## 2021-09-13 ENCOUNTER — Other Ambulatory Visit (HOSPITAL_COMMUNITY): Payer: Self-pay

## 2021-09-13 ENCOUNTER — Inpatient Hospital Stay: Payer: Medicaid Other

## 2021-09-13 ENCOUNTER — Inpatient Hospital Stay: Payer: Medicaid Other | Admitting: Internal Medicine

## 2021-09-13 ENCOUNTER — Inpatient Hospital Stay: Payer: Medicaid Other | Attending: Radiation Oncology

## 2021-09-13 VITALS — BP 110/59 | HR 50 | Temp 97.3°F | Resp 117 | Wt 141.4 lb

## 2021-09-13 DIAGNOSIS — C719 Malignant neoplasm of brain, unspecified: Secondary | ICD-10-CM

## 2021-09-13 DIAGNOSIS — C711 Malignant neoplasm of frontal lobe: Secondary | ICD-10-CM | POA: Insufficient documentation

## 2021-09-13 DIAGNOSIS — Z23 Encounter for immunization: Secondary | ICD-10-CM | POA: Insufficient documentation

## 2021-09-13 LAB — CMP (CANCER CENTER ONLY)
ALT: 15 U/L (ref 0–44)
AST: 16 U/L (ref 15–41)
Albumin: 4.1 g/dL (ref 3.5–5.0)
Alkaline Phosphatase: 62 U/L (ref 38–126)
Anion gap: 9 (ref 5–15)
BUN: 18 mg/dL (ref 6–20)
CO2: 28 mmol/L (ref 22–32)
Calcium: 9.2 mg/dL (ref 8.9–10.3)
Chloride: 108 mmol/L (ref 98–111)
Creatinine: 0.84 mg/dL (ref 0.61–1.24)
GFR, Estimated: >60 mL/min (ref >60)
Glucose, Bld: 81 mg/dL (ref 70–99)
Potassium: 4.1 mmol/L (ref 3.5–5.1)
Sodium: 145 mmol/L (ref 135–145)
Total Bilirubin: 0.6 mg/dL (ref 0.3–1.2)
Total Protein: 6.7 g/dL (ref 6.5–8.1)

## 2021-09-13 LAB — CBC WITH DIFFERENTIAL (CANCER CENTER ONLY)
Abs Immature Granulocytes: 0.02 10*3/uL (ref 0.00–0.07)
Basophils Absolute: 0 10*3/uL (ref 0.0–0.1)
Basophils Relative: 1 %
Eosinophils Absolute: 0.2 10*3/uL (ref 0.0–0.5)
Eosinophils Relative: 3 %
HCT: 43.8 % (ref 39.0–52.0)
Hemoglobin: 15.1 g/dL (ref 13.0–17.0)
Immature Granulocytes: 0 %
Lymphocytes Relative: 26 %
Lymphs Abs: 1.5 10*3/uL (ref 0.7–4.0)
MCH: 32.4 pg (ref 26.0–34.0)
MCHC: 34.5 g/dL (ref 30.0–36.0)
MCV: 94 fL (ref 80.0–100.0)
Monocytes Absolute: 0.5 10*3/uL (ref 0.1–1.0)
Monocytes Relative: 8 %
Neutro Abs: 3.6 10*3/uL (ref 1.7–7.7)
Neutrophils Relative %: 62 %
Platelet Count: 196 10*3/uL (ref 150–400)
RBC: 4.66 MIL/uL (ref 4.22–5.81)
RDW: 13 % (ref 11.5–15.5)
WBC Count: 5.8 10*3/uL (ref 4.0–10.5)
nRBC: 0 % (ref 0.0–0.2)

## 2021-09-13 MED ORDER — INFLUENZA VAC SPLIT QUAD 0.5 ML IM SUSY
0.5000 mL | PREFILLED_SYRINGE | Freq: Once | INTRAMUSCULAR | Status: AC
  Administered 2021-09-13: 0.5 mL via INTRAMUSCULAR
  Filled 2021-09-13: qty 0.5

## 2021-09-13 MED ORDER — TEMOZOLOMIDE 100 MG PO CAPS
100.0000 mg | ORAL_CAPSULE | Freq: Every day | ORAL | 0 refills | Status: DC
  Filled 2021-09-13: qty 5, 5d supply, fill #0
  Filled 2021-10-08: qty 5, 28d supply, fill #0

## 2021-09-13 MED ORDER — TEMOZOLOMIDE 250 MG PO CAPS
250.0000 mg | ORAL_CAPSULE | Freq: Every day | ORAL | 0 refills | Status: DC
  Filled 2021-09-13 – 2021-10-08 (×2): qty 5, 5d supply, fill #0

## 2021-09-13 NOTE — Progress Notes (Signed)
[facility] at [place], [place] [address] [phone]   Interval Evaluation  Date of Service: 09/13/21 Patient Name: Troy Leon Patient MRN: [id number] Patient DOB: 11/11/1971 Provider: Ventura Sellers, MD  Identifying Statement:  Troy Leon is a 50 y.o. male with right frontal  oligodendroglioma grade III     Oncology History  Oligodendroglioma, IDH gene mutant and 1p/19q-codeleted ([place])  01/09/2021 Surgery   Craniotomy, right frontal resection by Dr. Reatha Armour   02/19/2021 - 03/30/2021 Radiation Therapy   IMRT and concurrent Temodar 92m/m2   04/24/2021 -  Chemotherapy   5-day Temozolomide         Biomarkers:  MGMT Unknown.  IDH 1/2 Mutated.  EGFR Unknown  1p/19q co-deleted   Interval History:  Troy GIKASpresents today for follow up, now having completed cycle #4 of 5-day Temodar.  No issues tolerating chemo, but he continues to describe significant difficulty swallowing the large pills.  No nausea this month.  Otherwise denies new or progressive neurologic deficits, no seizures or headaches.  Continues with Keppra.  H+P (01/26/21) Patient presented to medical attention in February 2022 with several weeks history of new onset headaches.  He describes holo-cranial pain, severe in nature, often associated with AM time period.  In addition, he describes multiple episodes of "sudden dropping, legs getting out, feeling like I'm losing contact", followed by recovery associated with a period of fatigue.  Notably, these episodes stopped following surgery/resection on 01/09/21 ([name]) and initiation of anti-seizure medication.  Currently he feels well, aside from frequent burping and spasming of "esophagus".  Taking 446mof decadron three times per day since discharge.  No functional deficits otherwise.  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  Medications: Current Outpatient Medications on File  Prior to Visit  Medication Sig Dispense Refill   lamoTRIgine (LAMICTAL) 100 MG tablet Take 1 tablet (100 mg total) by mouth daily. 60 tablet 1   LORazepam (ATIVAN) 0.5 MG tablet 1 tab po 30 minutes prior to radiation (Patient not taking: No sig reported) 30 tablet 0   omeprazole (PRILOSEC) 20 MG capsule Take 1 capsule (20 mg total) by mouth 2 (two) times daily. 60 capsule 3   ondansetron (ZOFRAN) 8 MG tablet TAKE 1 TABLET BY MOUTH 2 TIMES DAILY AS NEEDED FOR NAUSEA AND VOMITING,MAY TAKE 30-60 MIN PRIOR TO TEMODAR ADMINISTRATION IF NAUSEA/VOMITING OCCURS 60 tablet 1   promethazine (PHENERGAN) 25 MG suppository Place 1 suppository (25 mg total) rectally every 6 (six) hours as needed for nausea or vomiting. (Patient not taking: No sig reported) 12 each 0   temozolomide (TEMODAR) 100 MG capsule Take 1 capsule (100 mg total) by mouth at bedtime. May take on an empty stomach to decrease nausea & vomiting. 5 capsule 0   temozolomide (TEMODAR) 250 MG capsule Take 1 capsule (250 mg total) by mouth at bedtime. May take on an empty stomach to decrease nausea & vomiting. 5 capsule 0   No current facility-administered medications on file prior to visit.    Allergies:  Allergies  Allergen Reactions   Penicillins Other (See Comments)    Has patient had a PCN reaction causing immediate rash, facial/tongue/throat swelling, SOB or lightheadedness with hypotension: unknown Has patient had a PCN reaction causing severe rash involving mucus membranes or skin necrosis: unknown Has patient had a PCN reaction that required hospitalization unknown Has patient had a PCN reaction occurring within the last  10 years:no If all of the above answers are "NO", then may proceed wit   Past Medical History:  Past Medical History:  Diagnosis Date   Oligodendroglioma of frontal lobe ([place]) 01/2021   Pancreatitis    Past Surgical History:  Past Surgical History:  Procedure Laterality Date   APPLICATION OF CRANIAL NAVIGATION  N/A 01/09/2021   Procedure: APPLICATION OF CRANIAL NAVIGATION;  Surgeon: Karsten Ro, DO;  Location: [facility];  Service: Neurosurgery;  Laterality: N/A;   CHOLECYSTECTOMY     CRANIOTOMY N/A 01/09/2021   Procedure: CRANIOTOMY FOR TUMOR EXCISION;  Surgeon: Karsten Ro, DO;  Location: [facility];  Service: Neurosurgery;  Laterality: N/A;   Social History:  Social History   Socioeconomic History   Marital status: Married    Spouse name: Not on file   Number of children: Not on file   Years of education: Not on file   Highest education level: Not on file  Occupational History   Not on file  Tobacco Use   Smoking status: Every Day    Packs/day: 0.50    Types: Cigarettes   Smokeless tobacco: Never  Vaping Use   Vaping Use: Never used  Substance and Sexual Activity   Alcohol use: No   Drug use: Yes    Frequency: 2.0 times per week    Types: Marijuana   Sexual activity: Not on file  Other Topics Concern   Not on file  Social History Narrative   Not on file   Social Determinants of Health   Financial Resource Strain: Not on file  Food Insecurity: Not on file  Transportation Needs: Not on file  Physical Activity: Not on file  Stress: Not on file  Social Connections: Not on file  Intimate Partner Violence: Not on file   Family History:  Family History  Problem Relation Age of Onset   Diabetes Mother    Diabetes Other    Heart attack Other    Hypertension Other     Review of Systems: Constitutional: Doesn't report fevers, chills or abnormal weight loss Eyes: Doesn't report blurriness of vision Ears, nose, mouth, throat, and face: Doesn't report sore throat Respiratory: Doesn't report cough, dyspnea or wheezes Cardiovascular: Doesn't report palpitation, chest discomfort  Gastrointestinal:  Doesn't report nausea, constipation, diarrhea GU: Doesn't report incontinence Skin: Doesn't report skin rashes Neurological: Per HPI Musculoskeletal: Doesn't report joint  pain Behavioral/Psych: Doesn't report anxiety  Physical Exam: Vitals:   09/13/21 1221  BP: (!) 110/59  Pulse: (!) 50  Resp: (!) 117  Temp: (!) 97.3 F (36.3 C)  SpO2: 100%    KPS: 90. General: Alert, cooperative, pleasant, in no acute distress Head: Craniotomy scar w/ staples EENT: No conjunctival injection or scleral icterus.  Lungs: Resp effort normal Cardiac: Regular rate Abdomen: Non-distended abdomen Skin: No rashes cyanosis or petechiae. Extremities: No clubbing or edema  Neurologic Exam: Mental Status: Awake, alert, attentive to examiner. Oriented to self and environment. Language is fluent with intact comprehension.  Cranial Nerves: Visual acuity is grossly normal. Visual fields are full. Extra-ocular movements intact. No ptosis. Face is symmetric Motor: Tone and bulk are normal. Power is full in both arms and legs. Reflexes are symmetric, no pathologic reflexes present.  Sensory: Intact to light touch Gait: Normal.   Labs: I have reviewed the data as listed    Component Value Date/Time   NA 144 08/16/2021 1202   NA 141 01/01/2021 0939   K 4.3 08/16/2021 1202   CL  107 08/16/2021 1202   CO2 26 08/16/2021 1202   GLUCOSE 93 08/16/2021 1202   BUN 15 08/16/2021 1202   BUN 12 01/01/2021 0939   CREATININE 0.91 08/16/2021 1202   CREATININE 0.80 12/16/2016 1603   CALCIUM 9.6 08/16/2021 1202   PROT 7.2 08/16/2021 1202   ALBUMIN 4.4 08/16/2021 1202   AST 17 08/16/2021 1202   ALT 17 08/16/2021 1202   ALKPHOS 65 08/16/2021 1202   BILITOT 0.8 08/16/2021 1202   GFRNONAA >60 08/16/2021 1202   GFRAA 107 01/01/2021 0939   Lab Results  Component Value Date   WBC 5.8 09/13/2021   NEUTROABS 3.6 09/13/2021   HGB 15.1 09/13/2021   HCT 43.8 09/13/2021   MCV 94.0 09/13/2021   PLT 196 09/13/2021   Imaging:  [facility] Clinician Interpretation: I have personally reviewed the CNS images as listed.  My interpretation, in the context of the patient's clinical presentation, is  stable disease  MR BRAIN W WO CONTRAST  Result Date: 09/12/2021 CLINICAL DATA:  Oligodendroglioma, assess treatment response EXAM: MRI HEAD WITHOUT AND WITH CONTRAST TECHNIQUE: Multiplanar, multiecho pulse sequences of the brain and surrounding structures were obtained without and with intravenous contrast. CONTRAST:  56m GADAVIST GADOBUTROL 1 MMOL/ML IV SOLN COMPARISON:  07/18/2021 FINDINGS: Brain: Status post right frontal craniotomy with subjacent resection cavity, which appears grossly unchanged, with encapsulated fluid and adjacent T2 hyperintense signal. No new or nodular enhancement. No acute hemorrhage, infarct, mass effect, or midline shift. Ex vacuo dilatation of the frontal horn of the right lateral ventricle. Vascular: Normal flow voids. Skull and upper cervical spine: Status post right frontal craniotomy. Otherwise normal marrow signal. Sinuses/Orbits: Negative. Other: The mastoids are well aerated. IMPRESSION: Stable appearance of the right frontal resection cavity. No evidence of residual or recurrent tumor. Electronically Signed   By: AMerilyn BabaM.D.   On: 09/12/2021 19:02    Assessment/Plan Oligodendroglioma, IDH gene mutant and 1p/19q-codeleted ([place] [C71.9]  RKEENA HEESCHis clinically and radiographically stable today, now having completed cycle #4 of adjuvant Temodar.  No new findings today.  We recommended continuing treatment with cycle #5 Temozolomide, dose increased to 200 mg/m2, on for five days and off for twenty three days in twenty eight day cycles. The patient will have a complete blood count performed on days 21 and 28 of each cycle, and a comprehensive metabolic panel performed on day 28 of each cycle. Labs may need to be performed more often. Zofran will prescribed for home use for nausea/vomiting.   Chemotherapy should be held for the following:  ANC less than 1,000  Platelets less than 100,000  LFT or creatinine greater than 2x ULN  If clinical  concerns/contraindications develop  For seizures, will con't Lamictal 1024mdaily as prior.  We ask that RoCyndia Benteturn to clinic in 1 months with labs for evaluation prior to cycle #6, or sooner as needed.  All questions were answered. The patient knows to call the clinic with any problems, questions or concerns. No barriers to learning were detected.  I have spent a total of 40 minutes of face-to-face and non-face-to-face time, excluding clinical staff time, preparing to see patient, ordering tests and/or medications, counseling the patient, and independently interpreting results and communicating results to the patient/family/caregiver    ZaVentura SellersMD Medical Director of Neuro-Oncology [facility] We[place]1/03/22 12:28 PM

## 2021-09-14 ENCOUNTER — Telehealth: Payer: Self-pay | Admitting: Internal Medicine

## 2021-09-14 ENCOUNTER — Other Ambulatory Visit (HOSPITAL_COMMUNITY): Payer: Self-pay

## 2021-09-14 NOTE — Telephone Encounter (Signed)
Scheduled Per 11/3 los, message has been left with pt

## 2021-09-17 ENCOUNTER — Other Ambulatory Visit (HOSPITAL_COMMUNITY): Payer: Self-pay

## 2021-09-17 ENCOUNTER — Inpatient Hospital Stay: Payer: Medicaid Other

## 2021-10-06 ENCOUNTER — Other Ambulatory Visit: Payer: Self-pay | Admitting: Internal Medicine

## 2021-10-08 ENCOUNTER — Telehealth: Payer: Self-pay | Admitting: *Deleted

## 2021-10-08 ENCOUNTER — Other Ambulatory Visit: Payer: Self-pay | Admitting: Internal Medicine

## 2021-10-08 ENCOUNTER — Encounter: Payer: Self-pay | Admitting: Internal Medicine

## 2021-10-08 ENCOUNTER — Other Ambulatory Visit (HOSPITAL_COMMUNITY): Payer: Self-pay

## 2021-10-08 MED ORDER — LAMOTRIGINE 100 MG PO TABS: 100.0000 mg | ORAL_TABLET | Freq: Every day | ORAL | 0 refills | Status: DC

## 2021-10-08 MED ORDER — LAMOTRIGINE 100 MG PO TABS: 100.0000 mg | ORAL_TABLET | Freq: Every day | ORAL | 2 refills | Status: DC

## 2021-10-08 NOTE — Telephone Encounter (Signed)
Patient called requesting refill of his Lamictal.  States he is out of this medication and hasn't had it for the past 2 days.   Refilled.

## 2021-10-10 ENCOUNTER — Ambulatory Visit: Payer: Medicaid Other | Admitting: Family Medicine

## 2021-10-11 ENCOUNTER — Inpatient Hospital Stay: Payer: Medicaid Other | Admitting: Internal Medicine

## 2021-10-11 ENCOUNTER — Inpatient Hospital Stay: Payer: Medicaid Other | Attending: Radiation Oncology

## 2021-10-11 ENCOUNTER — Other Ambulatory Visit: Payer: Self-pay

## 2021-10-11 ENCOUNTER — Telehealth: Payer: Self-pay | Admitting: Internal Medicine

## 2021-10-11 VITALS — BP 100/59 | HR 57 | Temp 97.5°F | Resp 20 | Wt 137.2 lb

## 2021-10-11 DIAGNOSIS — R112 Nausea with vomiting, unspecified: Secondary | ICD-10-CM | POA: Diagnosis not present

## 2021-10-11 DIAGNOSIS — C711 Malignant neoplasm of frontal lobe: Secondary | ICD-10-CM | POA: Diagnosis not present

## 2021-10-11 DIAGNOSIS — R569 Unspecified convulsions: Secondary | ICD-10-CM | POA: Insufficient documentation

## 2021-10-11 DIAGNOSIS — C719 Malignant neoplasm of brain, unspecified: Secondary | ICD-10-CM | POA: Diagnosis not present

## 2021-10-11 LAB — CBC WITH DIFFERENTIAL (CANCER CENTER ONLY)
Abs Immature Granulocytes: 0.01 10*3/uL (ref 0.00–0.07)
Basophils Absolute: 0 10*3/uL (ref 0.0–0.1)
Basophils Relative: 1 %
Eosinophils Absolute: 0.1 10*3/uL (ref 0.0–0.5)
Eosinophils Relative: 2 %
HCT: 39.8 % (ref 39.0–52.0)
Hemoglobin: 14.2 g/dL (ref 13.0–17.0)
Immature Granulocytes: 0 %
Lymphocytes Relative: 26 %
Lymphs Abs: 1.5 10*3/uL (ref 0.7–4.0)
MCH: 33.3 pg (ref 26.0–34.0)
MCHC: 35.7 g/dL (ref 30.0–36.0)
MCV: 93.4 fL (ref 80.0–100.0)
Monocytes Absolute: 0.4 10*3/uL (ref 0.1–1.0)
Monocytes Relative: 7 %
Neutro Abs: 3.7 10*3/uL (ref 1.7–7.7)
Neutrophils Relative %: 64 %
Platelet Count: 175 10*3/uL (ref 150–400)
RBC: 4.26 MIL/uL (ref 4.22–5.81)
RDW: 12.5 % (ref 11.5–15.5)
WBC Count: 5.8 10*3/uL (ref 4.0–10.5)
nRBC: 0 % (ref 0.0–0.2)

## 2021-10-11 LAB — CMP (CANCER CENTER ONLY)
ALT: 15 U/L (ref 0–44)
AST: 16 U/L (ref 15–41)
Albumin: 4 g/dL (ref 3.5–5.0)
Alkaline Phosphatase: 61 U/L (ref 38–126)
Anion gap: 9 (ref 5–15)
BUN: 15 mg/dL (ref 6–20)
CO2: 25 mmol/L (ref 22–32)
Calcium: 9 mg/dL (ref 8.9–10.3)
Chloride: 111 mmol/L (ref 98–111)
Creatinine: 0.97 mg/dL (ref 0.61–1.24)
GFR, Estimated: >60 mL/min (ref >60)
Glucose, Bld: 84 mg/dL (ref 70–99)
Potassium: 3.9 mmol/L (ref 3.5–5.1)
Sodium: 145 mmol/L (ref 135–145)
Total Bilirubin: 1 mg/dL (ref 0.3–1.2)
Total Protein: 6.4 g/dL — ABNORMAL LOW (ref 6.5–8.1)

## 2021-10-11 NOTE — Telephone Encounter (Signed)
Scheduled per 12/1 los, pt has been called and confirmed

## 2021-10-11 NOTE — Progress Notes (Signed)
[facility] at [place], [place] [address] [phone]   Interval Evaluation  Date of Service: 10/11/21 Patient Name: Troy Leon Patient MRN: [id number] Patient DOB: 21-Sep-1971 Provider: Ventura Sellers, MD  Identifying Statement:  Troy Leon is a 50 y.o. male with right frontal  oligodendroglioma grade III     Oncology History  Oligodendroglioma, IDH gene mutant and 1p/19q-codeleted ([place])  01/09/2021 Surgery   Craniotomy, right frontal resection by Dr. Reatha Armour   02/19/2021 - 03/30/2021 Radiation Therapy   IMRT and concurrent Temodar 75mg /m2   04/24/2021 -  Chemotherapy   5-day Temozolomide         Biomarkers:  MGMT Unknown.  IDH 1/2 Mutated.  EGFR Unknown  1p/19q co-deleted   Interval History:  HANAN MOEN presents today for follow up, now having completed cycle #5 of 5-day Temodar.  Denies new or progressive neurologic issues today.  No issues tolerating chemo, but he continues to describe significant difficulty swallowing the large pills.  No nausea this month, no seizures or headaches.  Continues with Keppra.  H+P (01/26/21) Patient presented to medical attention in February 2022 with several weeks history of new onset headaches.  He describes holo-cranial pain, severe in nature, often associated with AM time period.  In addition, he describes multiple episodes of "sudden dropping, legs getting out, feeling like I'm losing contact", followed by recovery associated with a period of fatigue.  Notably, these episodes stopped following surgery/resection on 01/09/21 ([name]) and initiation of anti-seizure medication.  Currently he feels well, aside from frequent burping and spasming of "esophagus".  Taking 4mg  of decadron three times per day since discharge.  No functional deficits otherwise.  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  Medications: Current Outpatient Medications on File Prior to  Visit  Medication Sig Dispense Refill   lamoTRIgine (LAMICTAL) 100 MG tablet Take 1 tablet (100 mg total) by mouth daily. 60 tablet 2   LORazepam (ATIVAN) 0.5 MG tablet 1 tab po 30 minutes prior to radiation (Patient not taking: No sig reported) 30 tablet 0   omeprazole (PRILOSEC) 20 MG capsule Take 1 capsule (20 mg total) by mouth 2 (two) times daily. 60 capsule 3   ondansetron (ZOFRAN) 8 MG tablet TAKE 1 TABLET BY MOUTH 2 TIMES DAILY AS NEEDED FOR NAUSEA AND VOMITING,MAY TAKE 30-60 MIN PRIOR TO TEMODAR ADMINISTRATION IF NAUSEA/VOMITING OCCURS 60 tablet 1   promethazine (PHENERGAN) 25 MG suppository Place 1 suppository (25 mg total) rectally every 6 (six) hours as needed for nausea or vomiting. (Patient not taking: No sig reported) 12 each 0   temozolomide (TEMODAR) 100 MG capsule Take 1 capsule (100 mg total) by mouth at bedtime. May take on an empty stomach to decrease nausea & vomiting. (Patient not taking: Reported on 09/13/2021) 5 capsule 0   temozolomide (TEMODAR) 100 MG capsule Take 1 capsule (100 mg total) by mouth at bedtime. May take on an empty stomach to decrease nausea & vomiting. 5 capsule 0   temozolomide (TEMODAR) 250 MG capsule Take 1 capsule (250 mg total) by mouth at bedtime. May take on an empty stomach to decrease nausea & vomiting. (Patient not taking: Reported on 09/13/2021) 5 capsule 0   temozolomide (TEMODAR) 250 MG capsule Take 1 capsule (250 mg total) by mouth at bedtime. May take on an empty stomach to decrease nausea & vomiting. 5 capsule 0   No current facility-administered medications on file  prior to visit.    Allergies:  Allergies  Allergen Reactions   Penicillins Other (See Comments)    Has patient had a PCN reaction causing immediate rash, facial/tongue/throat swelling, SOB or lightheadedness with hypotension: unknown Has patient had a PCN reaction causing severe rash involving mucus membranes or skin necrosis: unknown Has patient had a PCN reaction that  required hospitalization unknown Has patient had a PCN reaction occurring within the last 10 years:no If all of the above answers are "NO", then may proceed wit   Past Medical History:  Past Medical History:  Diagnosis Date   Oligodendroglioma of frontal lobe ([place]) 01/2021   Pancreatitis    Past Surgical History:  Past Surgical History:  Procedure Laterality Date   APPLICATION OF CRANIAL NAVIGATION N/A 01/09/2021   Procedure: APPLICATION OF CRANIAL NAVIGATION;  Surgeon: Karsten Ro, DO;  Location: [facility];  Service: Neurosurgery;  Laterality: N/A;   CHOLECYSTECTOMY     CRANIOTOMY N/A 01/09/2021   Procedure: CRANIOTOMY FOR TUMOR EXCISION;  Surgeon: Karsten Ro, DO;  Location: [facility];  Service: Neurosurgery;  Laterality: N/A;   Social History:  Social History   Socioeconomic History   Marital status: Married    Spouse name: Not on file   Number of children: Not on file   Years of education: Not on file   Highest education level: Not on file  Occupational History   Not on file  Tobacco Use   Smoking status: Every Day    Packs/day: 0.50    Types: Cigarettes   Smokeless tobacco: Never  Vaping Use   Vaping Use: Never used  Substance and Sexual Activity   Alcohol use: No   Drug use: Yes    Frequency: 2.0 times per week    Types: Marijuana   Sexual activity: Not on file  Other Topics Concern   Not on file  Social History Narrative   Not on file   Social Determinants of Health   Financial Resource Strain: Not on file  Food Insecurity: Not on file  Transportation Needs: Not on file  Physical Activity: Not on file  Stress: Not on file  Social Connections: Not on file  Intimate Partner Violence: Not on file   Family History:  Family History  Problem Relation Age of Onset   Diabetes Mother    Diabetes Other    Heart attack Other    Hypertension Other     Review of Systems: Constitutional: Doesn't report fevers, chills or abnormal weight loss Eyes: Doesn't report  blurriness of vision Ears, nose, mouth, throat, and face: Doesn't report sore throat Respiratory: Doesn't report cough, dyspnea or wheezes Cardiovascular: Doesn't report palpitation, chest discomfort  Gastrointestinal:  Doesn't report nausea, constipation, diarrhea GU: Doesn't report incontinence Skin: Doesn't report skin rashes Neurological: Per HPI Musculoskeletal: Doesn't report joint pain Behavioral/Psych: Doesn't report anxiety  Physical Exam: Vitals:   10/11/21 1245  BP: (!) 100/59  Pulse: (!) 57  Resp: 20  Temp: (!) 97.5 F (36.4 C)  SpO2: 100%    KPS: 90. General: Alert, cooperative, pleasant, in no acute distress Head: Craniotomy scar w/ staples EENT: No conjunctival injection or scleral icterus.  Lungs: Resp effort normal Cardiac: Regular rate Abdomen: Non-distended abdomen Skin: No rashes cyanosis or petechiae. Extremities: No clubbing or edema  Neurologic Exam: Mental Status: Awake, alert, attentive to examiner. Oriented to self and environment. Language is fluent with intact comprehension.  Cranial Nerves: Visual acuity is grossly normal. Visual fields are full. Extra-ocular  movements intact. No ptosis. Face is symmetric Motor: Tone and bulk are normal. Power is full in both arms and legs. Reflexes are symmetric, no pathologic reflexes present.  Sensory: Intact to light touch Gait: Normal.   Labs: I have reviewed the data as listed    Component Value Date/Time   NA 145 09/13/2021 1203   NA 141 01/01/2021 0939   K 4.1 09/13/2021 1203   CL 108 09/13/2021 1203   CO2 28 09/13/2021 1203   GLUCOSE 81 09/13/2021 1203   BUN 18 09/13/2021 1203   BUN 12 01/01/2021 0939   CREATININE 0.84 09/13/2021 1203   CREATININE 0.80 12/16/2016 1603   CALCIUM 9.2 09/13/2021 1203   PROT 6.7 09/13/2021 1203   ALBUMIN 4.1 09/13/2021 1203   AST 16 09/13/2021 1203   ALT 15 09/13/2021 1203   ALKPHOS 62 09/13/2021 1203   BILITOT 0.6 09/13/2021 1203   GFRNONAA >60 09/13/2021  1203   GFRAA 107 01/01/2021 0939   Lab Results  Component Value Date   WBC 5.8 10/11/2021   NEUTROABS 3.7 10/11/2021   HGB 14.2 10/11/2021   HCT 39.8 10/11/2021   MCV 93.4 10/11/2021   PLT 175 10/11/2021     Assessment/Plan Oligodendroglioma, IDH gene mutant and 1p/19q-codeleted ([place]) [C71.9]  Cyndia Bent is clinically and radiographically stable today, now having completed cycle #5 of adjuvant Temodar.  No new findings today.  We recommended continuing treatment with cycle #6 Temozolomide, at 200 mg/m2, on for five days and off for twenty three days in twenty eight day cycles. The patient will have a complete blood count performed on days 21 and 28 of each cycle, and a comprehensive metabolic panel performed on day 28 of each cycle. Labs may need to be performed more often. Zofran will prescribed for home use for nausea/vomiting.   Chemotherapy should be held for the following:  ANC less than 1,000  Platelets less than 100,000  LFT or creatinine greater than 2x ULN  If clinical concerns/contraindications develop  For seizures, will con't Lamictal $RemoveBefore'100mg'[id number]$  daily as prior.  We ask that Cyndia Bent return to clinic in 1 months with MRI brain for evaluation, or sooner as needed.  All questions were answered. The patient knows to call the clinic with any problems, questions or concerns. No barriers to learning were detected.  I have spent a total of 30 minutes of face-to-face and non-face-to-face time, excluding clinical staff time, preparing to see patient, ordering tests and/or medications, counseling the patient, and independently interpreting results and communicating results to the patient/family/caregiver    Ventura Sellers, MD Medical Director of Neuro-Oncology [facility] at [place] 10/11/21 12:55 PM

## 2021-10-16 ENCOUNTER — Other Ambulatory Visit (HOSPITAL_COMMUNITY): Payer: Self-pay

## 2021-11-01 ENCOUNTER — Other Ambulatory Visit (HOSPITAL_COMMUNITY): Payer: Self-pay

## 2021-11-07 ENCOUNTER — Other Ambulatory Visit: Payer: Self-pay

## 2021-11-07 ENCOUNTER — Ambulatory Visit (HOSPITAL_COMMUNITY)
Admission: RE | Admit: 2021-11-07 | Discharge: 2021-11-07 | Disposition: A | Discharge status: Home / Self Care | Payer: Medicaid Other | Source: Ambulatory Visit | Attending: Internal Medicine | Admitting: Internal Medicine

## 2021-11-07 DIAGNOSIS — C719 Malignant neoplasm of brain, unspecified: Secondary | ICD-10-CM | POA: Diagnosis not present

## 2021-11-07 DIAGNOSIS — D496 Neoplasm of unspecified behavior of brain: Secondary | ICD-10-CM | POA: Diagnosis not present

## 2021-11-07 IMAGING — MR MR HEAD WO/W CM
14 of 15 series · 38 of 48 positions shown · IV contrast (gadavist)
Comparison: [DATE] and [DATE]

CLINICAL DATA: Oligodendroglioma.

EXAM:
MRI HEAD WITHOUT AND WITH CONTRAST
TECHNIQUE: Multiplanar, multiecho pulse sequences of the brain and surrounding
structures were obtained without and with intravenous contrast.
CONTRAST:  7mL GADAVIST GADOBUTROL 1 MMOL/ML IV SOLN

[Series 5: DWI · axial · 3.0mm · 0.77mm/px · z∈[-80,+67]mm · 2 of 50 slices shown (1 of 4)]
[im 1/50]
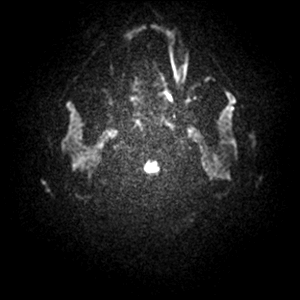
[im 50/50]
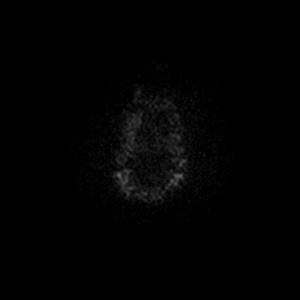

[Series 6: DWI · axial · 3.0mm · 0.77mm/px · z∈[-80,+67]mm · 3 of 50 slices shown (2 of 4)]
[im 1/50]
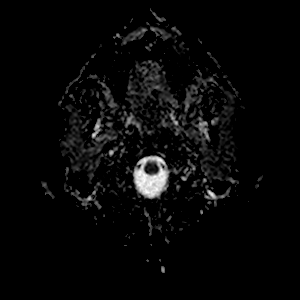
[im 25/50]
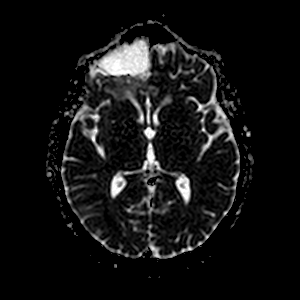
[im 50/50]
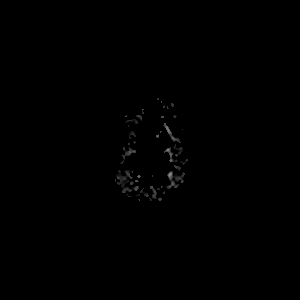

[Series 7: DWI · coronal · 5.0mm · 0.88mm/px · 2 of 28 slices shown (3 of 4)]
[im 1/28]
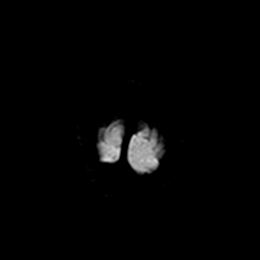
[im 28/28]
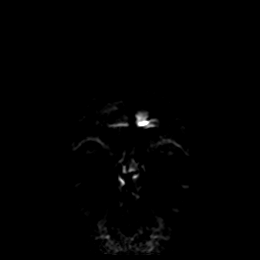

[Series 8: DWI · coronal · 5.0mm · 0.88mm/px · 2 of 28 slices shown (4 of 4)]
[im 1/28]
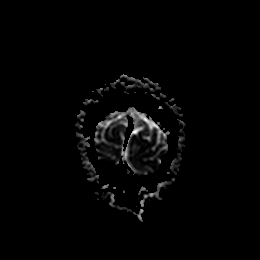
[im 28/28]
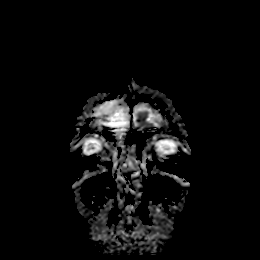

[Series 9: T1 · sagittal · 5.0mm · 0.75mm/px · 1 of 21 slices shown (1 of 2)]
[im 1/21]
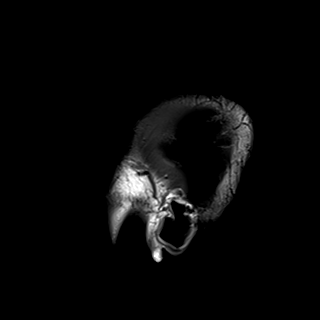

[Series 10: T2 · axial · 5.0mm · 0.72mm/px · 1 of 23 slices shown (1 of 2)]
[im 1/23]
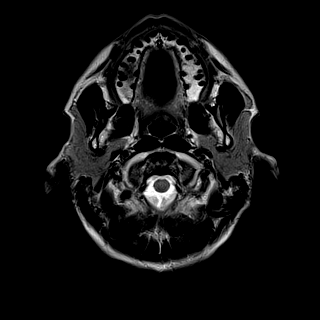

[Series 11: mag_images · axial · 3.0mm · 0.90mm/px · z∈[-94,+83]mm · 3 of 60 slices shown]
[im 1/60]
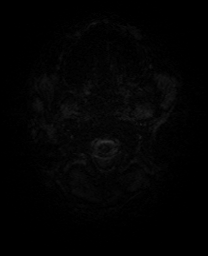
[im 30/60]
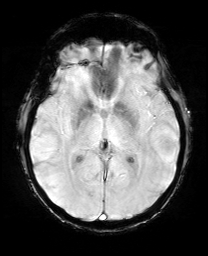
[im 60/60]
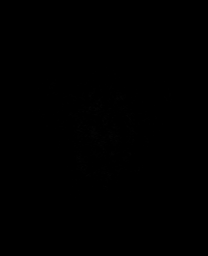

[Series 12: pha_images · axial · 3.0mm · 0.90mm/px · z∈[-94,+77]mm · 3 of 58 slices shown]
[im 1/58]
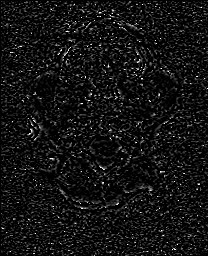
[im 29/58]
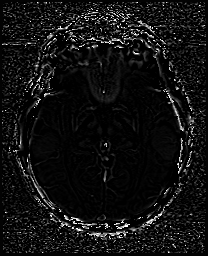
[im 58/58]
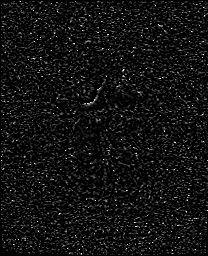

[Series 13: swi_images · axial · 3.0mm · 0.90mm/px · z∈[-94,+83]mm · 3 of 60 slices shown]
[im 1/60]
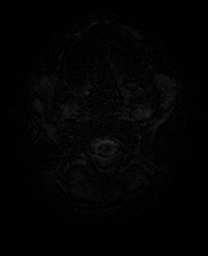
[im 30/60]
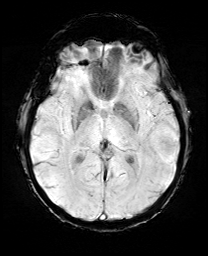
[im 60/60]
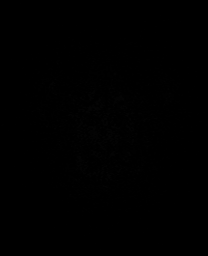

[Series 15: FLAIR · axial · 3.0mm · 0.45mm/px · z∈[-79,+68]mm · 3 of 50 slices shown]
[im 1/50]
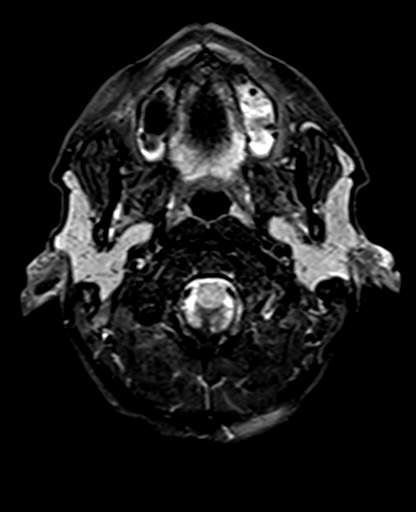
[im 25/50]
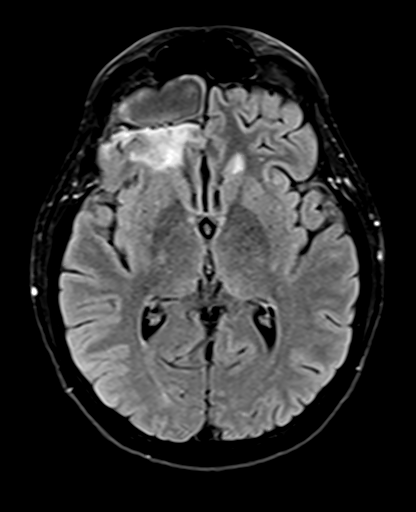
[im 50/50]
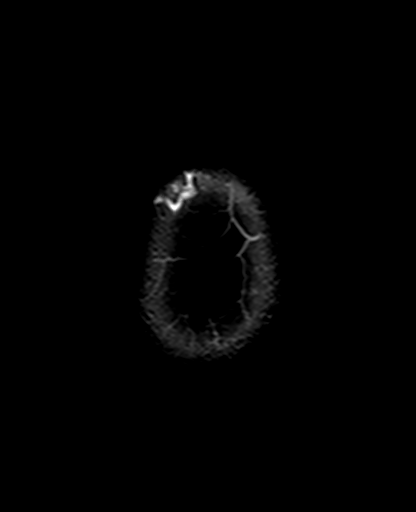

[Series 16: T1 · axial · 1.0mm · 0.98mm/px · z∈[-93,+82]mm · 10 of 176 slices shown (2 of 2)]
[im 1/176]
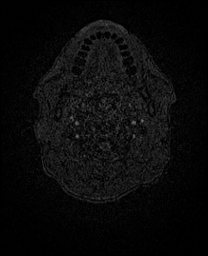
[im 20/176]
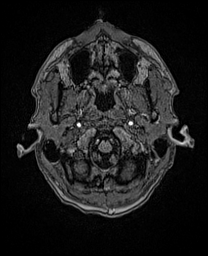
[im 39/176]
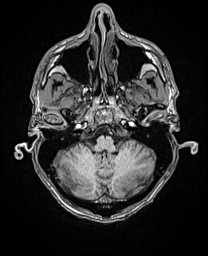
[im 59/176]
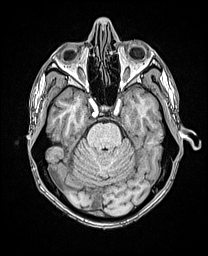
[im 78/176]
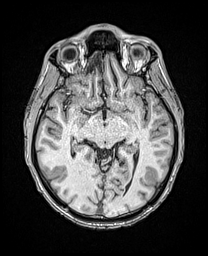
[im 98/176]
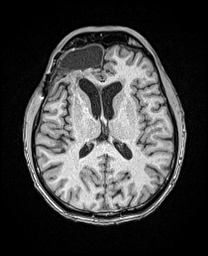
[im 117/176]
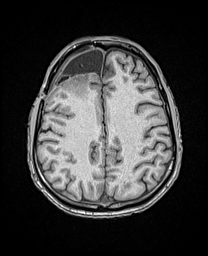
[im 137/176]
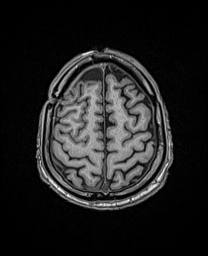
[im 156/176]
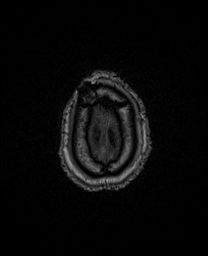
[im 176/176]
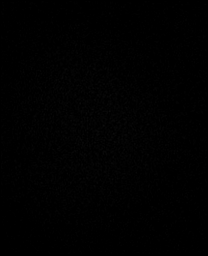

[Series 21: T2 · coronal · 5.0mm · 0.72mm/px · 2 of 28 slices shown (2 of 2)]
[im 1/28]
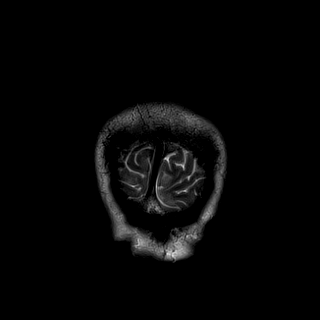
[im 28/28]
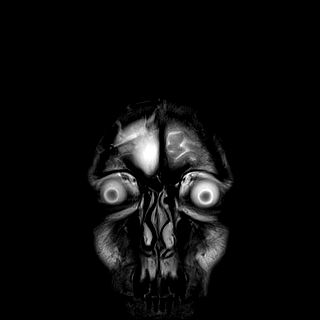

[Series 23: T1 post-contrast · coronal · 5.0mm · 0.34mm/px · 2 of 28 slices shown (1 of 2)]
[im 1/28]
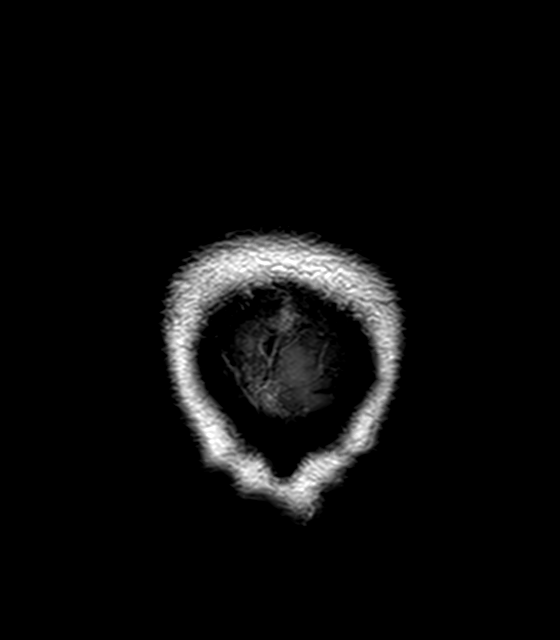
[im 28/28]
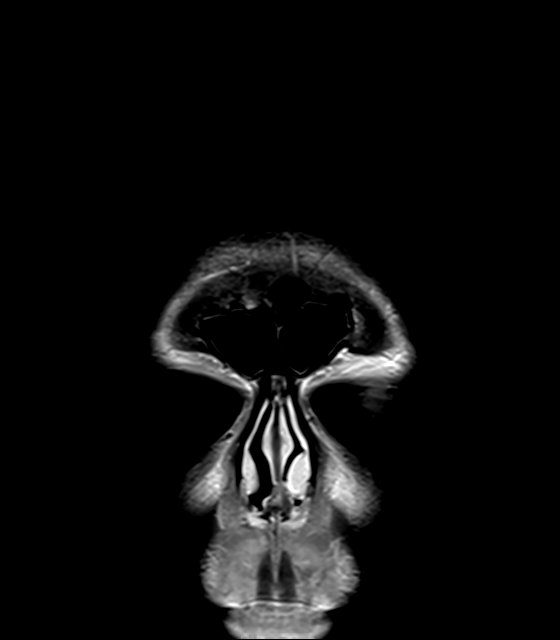

[Series 24: T1 post-contrast · sagittal · 5.0mm · 0.75mm/px · 1 of 21 slices shown (2 of 2)]
[im 1/21]
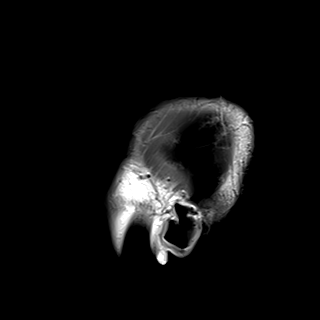

[38 of 48 positions shown; findings below may reference images not displayed]

FINDINGS: Brain: Sequelae of right frontal craniotomy and tumor resection are
again identified. An extra-axial fluid collection at the right
frontal resection site is unchanged. Nonenhancing T2 hyperintensity
anteriorly in the right frontal white matter extending from the
resection margin to the frontal horn of the lateral ventricle is
unchanged and without mass effect. No enhancing tumor is evident. No
acute infarct, acute intracranial hemorrhage, or midline shift is
identified.

Vascular: Major intracranial vascular flow voids are preserved.

Skull and upper cervical spine: Right frontal craniotomy. No
suspicious marrow lesion.

Sinuses/Orbits: Unremarkable orbits. Paranasal sinuses and mastoid
air cells are clear.

Other: None.
IMPRESSION: Stable post treatment appearance of the brain.  No enhancing tumor.

## 2021-11-07 MED ORDER — GADOBUTROL 1 MMOL/ML IV SOLN
7.0000 mL | Freq: Once | INTRAVENOUS | Status: AC | PRN
  Administered 2021-11-07: 10:00:00 7 mL via INTRAVENOUS

## 2021-11-08 ENCOUNTER — Ambulatory Visit (HOSPITAL_COMMUNITY): Payer: Medicaid Other

## 2021-11-13 ENCOUNTER — Other Ambulatory Visit (HOSPITAL_COMMUNITY): Payer: Self-pay

## 2021-11-13 ENCOUNTER — Inpatient Hospital Stay: Payer: Medicaid Other | Attending: Radiation Oncology

## 2021-11-13 ENCOUNTER — Inpatient Hospital Stay: Payer: Medicaid Other | Admitting: Internal Medicine

## 2021-11-13 ENCOUNTER — Other Ambulatory Visit: Payer: Self-pay

## 2021-11-13 ENCOUNTER — Other Ambulatory Visit: Payer: Self-pay | Admitting: Internal Medicine

## 2021-11-13 VITALS — BP 104/64 | HR 50 | Temp 98.1°F | Resp 17

## 2021-11-13 DIAGNOSIS — C711 Malignant neoplasm of frontal lobe: Secondary | ICD-10-CM | POA: Insufficient documentation

## 2021-11-13 DIAGNOSIS — R569 Unspecified convulsions: Secondary | ICD-10-CM | POA: Insufficient documentation

## 2021-11-13 DIAGNOSIS — Z79899 Other long term (current) drug therapy: Secondary | ICD-10-CM | POA: Diagnosis not present

## 2021-11-13 DIAGNOSIS — C719 Malignant neoplasm of brain, unspecified: Secondary | ICD-10-CM | POA: Diagnosis not present

## 2021-11-13 LAB — CMP (CANCER CENTER ONLY)
ALT: 13 U/L (ref 0–44)
AST: 14 U/L — ABNORMAL LOW (ref 15–41)
Albumin: 4.4 g/dL (ref 3.5–5.0)
Alkaline Phosphatase: 63 U/L (ref 38–126)
Anion gap: 9 (ref 5–15)
BUN: 19 mg/dL (ref 6–20)
CO2: 28 mmol/L (ref 22–32)
Calcium: 9.9 mg/dL (ref 8.9–10.3)
Chloride: 107 mmol/L (ref 98–111)
Creatinine: 1.09 mg/dL (ref 0.61–1.24)
GFR, Estimated: >60 mL/min (ref >60)
Glucose, Bld: 112 mg/dL — ABNORMAL HIGH (ref 70–99)
Potassium: 4.5 mmol/L (ref 3.5–5.1)
Sodium: 144 mmol/L (ref 135–145)
Total Bilirubin: 0.7 mg/dL (ref 0.3–1.2)
Total Protein: 6.8 g/dL (ref 6.5–8.1)

## 2021-11-13 LAB — CBC WITH DIFFERENTIAL (CANCER CENTER ONLY)
Abs Immature Granulocytes: 0.02 10*3/uL (ref 0.00–0.07)
Basophils Absolute: 0 10*3/uL (ref 0.0–0.1)
Basophils Relative: 1 %
Eosinophils Absolute: 0.2 10*3/uL (ref 0.0–0.5)
Eosinophils Relative: 2 %
HCT: 44.5 % (ref 39.0–52.0)
Hemoglobin: 14.9 g/dL (ref 13.0–17.0)
Immature Granulocytes: 0 %
Lymphocytes Relative: 27 %
Lymphs Abs: 1.8 10*3/uL (ref 0.7–4.0)
MCH: 33 pg (ref 26.0–34.0)
MCHC: 33.5 g/dL (ref 30.0–36.0)
MCV: 98.5 fL (ref 80.0–100.0)
Monocytes Absolute: 0.5 10*3/uL (ref 0.1–1.0)
Monocytes Relative: 8 %
Neutro Abs: 4.2 10*3/uL (ref 1.7–7.7)
Neutrophils Relative %: 62 %
Platelet Count: 185 10*3/uL (ref 150–400)
RBC: 4.52 MIL/uL (ref 4.22–5.81)
RDW: 12.4 % (ref 11.5–15.5)
WBC Count: 6.7 10*3/uL (ref 4.0–10.5)
nRBC: 0 % (ref 0.0–0.2)

## 2021-11-13 MED ORDER — TEMOZOLOMIDE 100 MG PO CAPS
100.0000 mg | ORAL_CAPSULE | Freq: Every day | ORAL | 0 refills | Status: DC
  Filled 2021-11-13: qty 5, 28d supply, fill #0

## 2021-11-13 MED ORDER — TEMOZOLOMIDE 250 MG PO CAPS
250.0000 mg | ORAL_CAPSULE | Freq: Every day | ORAL | 0 refills | Status: DC
  Filled 2021-11-13: qty 5, 28d supply, fill #0

## 2021-11-13 NOTE — Progress Notes (Signed)
[facility] [address] [place], [place] [address] [phone]   Interval Evaluation  Date of Service: 11/13/21 Patient Name: Troy Leon Patient MRN: [id number] Patient DOB: 10/26/1971 Provider: Ventura [facility], MD  Identifying Statement:  Troy Leon is a 51 y.o. male with right frontal  oligodendroglioma grade III     Oncology History  Oligodendroglioma, IDH gene mutant and 1p/19q-codeleted ([place])  01/09/2021 Surgery   Craniotomy, right frontal resection by Dr. Reatha Armour   02/19/2021 - 03/30/2021 Radiation Therapy   IMRT and concurrent Temodar 51m/m2   04/24/2021 -  Chemotherapy   5-day Temozolomide         Biomarkers:  MGMT Unknown.  IDH 1/2 Mutated.  EGFR Unknown  1p/19q co-deleted   Interval History:  Troy Leon today for follow up, now having completed recent MRI brain and cycle #6 of 5-day Temodar.  Denies new or progressive neurologic issues today.  No issues tolerating chemo, still having issues swallowing the pills.  No nausea this month, no seizures or headaches.  Continues with Keppra.  H+P (01/26/21) Patient presented to medical attention in February 2022 with several weeks history of new onset headaches.  He describes holo-cranial pain, severe in nature, often associated with AM time period.  In addition, he describes multiple episodes of "sudden dropping, legs getting out, feeling like I'm losing contact", followed by recovery associated with a period of fatigue.  Notably, these episodes stopped following surgery/resection on 01/09/21 ([name]) and initiation of anti-seizure medication.  Currently he feels well, aside from frequent burping and spasming of "esophagus".  Taking 47mof decadron three times per day since discharge.  No functional deficits otherwise.  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  Medications: Current Outpatient Medications on File Prior to Visit  Medication  Sig Dispense Refill   lamoTRIgine (LAMICTAL) 100 MG tablet Take 1 tablet (100 mg total) by mouth daily. 60 tablet 2   LORazepam (ATIVAN) 0.5 MG tablet 1 tab po 30 minutes prior to radiation (Patient not taking: No sig reported) 30 tablet 0   omeprazole (PRILOSEC) 20 MG capsule Take 1 capsule (20 mg total) by mouth 2 (two) times daily. 60 capsule 3   ondansetron (ZOFRAN) 8 MG tablet TAKE 1 TABLET BY MOUTH 2 TIMES DAILY AS NEEDED FOR NAUSEA AND VOMITING,MAY TAKE 30-60 MIN PRIOR TO TEMODAR ADMINISTRATION IF NAUSEA/VOMITING OCCURS 60 tablet 1   promethazine (PHENERGAN) 25 MG suppository Place 1 suppository (25 mg total) rectally every 6 (six) hours as needed for nausea or vomiting. (Patient not taking: No sig reported) 12 each 0   temozolomide (TEMODAR) 100 MG capsule Take 1 capsule (100 mg total) by mouth at bedtime. May take on an empty stomach to decrease nausea & vomiting. (Patient not taking: Reported on 09/13/2021) 5 capsule 0   temozolomide (TEMODAR) 250 MG capsule Take 1 capsule (250 mg total) by mouth at bedtime. May take on an empty stomach to decrease nausea & vomiting. (Patient not taking: Reported on 09/13/2021) 5 capsule 0   No current facility-administered medications on file prior to visit.    Allergies:  Allergies  Allergen Reactions   Penicillins Other (See Comments)    Has patient had a PCN reaction causing immediate rash, facial/tongue/throat swelling, SOB or lightheadedness with hypotension: unknown Has patient had a PCN reaction causing severe rash involving mucus membranes or skin necrosis: unknown Has patient had a PCN reaction that required hospitalization  unknown Has patient had a PCN reaction occurring within the last 10 years:no If all of the above answers are "NO", then may proceed wit   Past Medical History:  Past Medical History:  Diagnosis Date   Oligodendroglioma of frontal lobe ([facility]) 01/2021   Pancreatitis    Past Surgical History:  Past Surgical History:   Procedure Laterality Date   APPLICATION OF CRANIAL NAVIGATION N/A 01/09/2021   Procedure: APPLICATION OF CRANIAL NAVIGATION;  Surgeon: Karsten Ro, DO;  Location: [facility];  Service: Neurosurgery;  Laterality: N/A;   CHOLECYSTECTOMY     CRANIOTOMY N/A 01/09/2021   Procedure: CRANIOTOMY FOR TUMOR EXCISION;  Surgeon: Karsten Ro, DO;  Location: [facility];  Service: Neurosurgery;  Laterality: N/A;   Social History:  Social History   Socioeconomic History   Marital status: Married    Spouse name: Not on file   Number of children: Not on file   Years of education: Not on file   Highest education level: Not on file  Occupational History   Not on file  Tobacco Use   Smoking status: Every Day    Packs/day: 0.50    Types: Cigarettes   Smokeless tobacco: Never  Vaping Use   Vaping Use: Never used  Substance and Sexual Activity   Alcohol use: No   Drug use: Yes    Frequency: 2.0 times per week    Types: Marijuana   Sexual activity: Not on file  Other Topics Concern   Not on file  Social History Narrative   Not on file   Social Determinants of Health   Financial Resource Strain: Not on file  Food Insecurity: Not on file  Transportation Needs: Not on file  Physical Activity: Not on file  Stress: Not on file  Social Connections: Not on file  Intimate Partner Violence: Not on file   Family History:  Family History  Problem Relation Age of Onset   Diabetes Mother    Diabetes Other    Heart attack Other    Hypertension Other     Review of Systems: Constitutional: Doesn't report fevers, chills or abnormal weight loss Eyes: Doesn't report blurriness of vision Ears, nose, mouth, throat, and face: Doesn't report sore throat Respiratory: Doesn't report cough, dyspnea or wheezes Cardiovascular: Doesn't report palpitation, chest discomfort  Gastrointestinal:  Doesn't report nausea, constipation, diarrhea GU: Doesn't report incontinence Skin: Doesn't report skin  rashes Neurological: Per HPI Musculoskeletal: Doesn't report joint pain Behavioral/Psych: Doesn't report anxiety  Physical Exam: Vitals:   11/13/21 1247  BP: 104/64  Pulse: (!) 50  Resp: 17  Temp: 98.1 F (36.7 C)  SpO2: 100%    KPS: 90. General: Alert, cooperative, pleasant, in no acute distress Head: Craniotomy scar w/ staples EENT: No conjunctival injection or scleral icterus.  Lungs: Resp effort normal Cardiac: Regular rate Abdomen: Non-distended abdomen Skin: No rashes cyanosis or petechiae. Extremities: No clubbing or edema  Neurologic Exam: Mental Status: Awake, alert, attentive to examiner. Oriented to self and environment. Language is fluent with intact comprehension.  Cranial Nerves: Visual acuity is grossly normal. Visual fields are full. Extra-ocular movements intact. No ptosis. Face is symmetric Motor: Tone and bulk are normal. Power is full in both arms and legs. Reflexes are symmetric, no pathologic reflexes present.  Sensory: Intact to light touch Gait: Normal.   Labs: I have reviewed the data as listed    Component Value Date/Time   NA 145 10/11/2021 1231   NA 141 01/01/2021 0939  K 3.9 10/11/2021 1231   CL 111 10/11/2021 1231   CO2 25 10/11/2021 1231   GLUCOSE 84 10/11/2021 1231   BUN 15 10/11/2021 1231   BUN 12 01/01/2021 0939   CREATININE 0.97 10/11/2021 1231   CREATININE 0.80 12/16/2016 1603   CALCIUM 9.0 10/11/2021 1231   PROT 6.4 (L) 10/11/2021 1231   ALBUMIN 4.0 10/11/2021 1231   AST 16 10/11/2021 1231   ALT 15 10/11/2021 1231   ALKPHOS 61 10/11/2021 1231   BILITOT 1.0 10/11/2021 1231   GFRNONAA >60 10/11/2021 1231   GFRAA 107 01/01/2021 0939   Lab Results  Component Value Date   WBC 5.8 10/11/2021   NEUTROABS 3.7 10/11/2021   HGB 14.2 10/11/2021   HCT 39.8 10/11/2021   MCV 93.4 10/11/2021   PLT 175 10/11/2021    Imaging:  [place] Clinician Interpretation: I have personally reviewed the CNS images as listed.  My  interpretation, in the context of the patient's clinical presentation, is stable disease  MR BRAIN W WO CONTRAST  Result Date: 11/07/2021 CLINICAL DATA:  Oligodendroglioma. EXAM: MRI HEAD WITHOUT AND WITH CONTRAST TECHNIQUE: Multiplanar, multiecho pulse sequences of the brain and surrounding structures were obtained without and with intravenous contrast. CONTRAST:  53m GADAVIST GADOBUTROL 1 MMOL/ML IV SOLN COMPARISON:  09/12/2021 and 07/18/2021 FINDINGS: Brain: Sequelae of right frontal craniotomy and tumor resection are again identified. An extra-axial fluid collection at the right frontal resection site is unchanged. Nonenhancing T2 hyperintensity anteriorly in the right frontal white matter extending from the resection margin to the frontal horn of the lateral ventricle is unchanged and without mass effect. No enhancing tumor is evident. No acute infarct, acute intracranial hemorrhage, or midline shift is identified. Vascular: Major intracranial vascular flow voids are preserved. Skull and upper cervical spine: Right frontal craniotomy. No suspicious marrow lesion. Sinuses/Orbits: Unremarkable orbits. Paranasal sinuses and mastoid air cells are clear. Other: None. IMPRESSION: Stable post treatment appearance of the brain.  No enhancing tumor. Electronically Signed   By: ALogan BoresM.D.   On: 11/07/2021 16:56     Assessment/Plan Oligodendroglioma, IDH gene mutant and 1p/19q-codeleted ([place] [C71.9]  Troy MCPHEETERSis clinically and radiographically stable today, now having completed cycle #6 of adjuvant Temodar.  No new findings today.  We recommended transitioning to serial MRI monitoring at this time.  He is agreeable with this plan.  For seizures, will con't Lamictal 1017mdaily as prior.  We ask that RoCyndia Benteturn to clinic in 3 months with MRI brain for evaluation, or sooner as needed.  All questions were answered. The patient knows to call the clinic with any problems, questions  or concerns. No barriers to learning were detected.  I have spent a total of 30 minutes of face-to-face and non-face-to-face time, excluding clinical staff time, preparing to see patient, ordering tests and/or medications, counseling the patient, and independently interpreting results and communicating results to the patient/family/caregiver    ZaVentura SellersMD Medical Director of Neuro-Oncology [facility] WePort Royal1/03/23 12:42 PM

## 2021-11-14 ENCOUNTER — Telehealth: Payer: Self-pay | Admitting: Internal Medicine

## 2021-11-14 NOTE — Telephone Encounter (Signed)
Scheduled follow-up appointment per 1/3 los. Patient is aware. °

## 2021-12-10 ENCOUNTER — Other Ambulatory Visit (HOSPITAL_COMMUNITY): Payer: Self-pay

## 2021-12-13 ENCOUNTER — Telehealth: Payer: Self-pay | Admitting: *Deleted

## 2021-12-13 ENCOUNTER — Other Ambulatory Visit: Payer: Self-pay | Admitting: Internal Medicine

## 2021-12-13 MED ORDER — LAMOTRIGINE 100 MG PO TABS: 100.0000 mg | ORAL_TABLET | Freq: Every day | ORAL | 2 refills | Status: DC

## 2021-12-13 NOTE — Telephone Encounter (Signed)
Patient needs refill of lamotrigine to pharmacy on file.    Routed to Dr Mickeal Skinner

## 2022-01-02 DIAGNOSIS — C719 Malignant neoplasm of brain, unspecified: Secondary | ICD-10-CM | POA: Diagnosis not present

## 2022-01-16 ENCOUNTER — Other Ambulatory Visit: Payer: Self-pay | Admitting: Radiation Therapy

## 2022-02-04 ENCOUNTER — Ambulatory Visit (HOSPITAL_COMMUNITY): Payer: Medicaid Other | Attending: Internal Medicine

## 2022-02-11 ENCOUNTER — Inpatient Hospital Stay: Payer: Medicaid Other | Admitting: Internal Medicine

## 2022-02-11 ENCOUNTER — Inpatient Hospital Stay: Payer: Medicaid Other | Attending: Radiation Oncology

## 2022-02-11 DIAGNOSIS — F17210 Nicotine dependence, cigarettes, uncomplicated: Secondary | ICD-10-CM | POA: Insufficient documentation

## 2022-02-11 DIAGNOSIS — C711 Malignant neoplasm of frontal lobe: Secondary | ICD-10-CM | POA: Insufficient documentation

## 2022-02-11 DIAGNOSIS — Z923 Personal history of irradiation: Secondary | ICD-10-CM | POA: Insufficient documentation

## 2022-02-11 DIAGNOSIS — Z79899 Other long term (current) drug therapy: Secondary | ICD-10-CM | POA: Insufficient documentation

## 2022-02-11 DIAGNOSIS — Z9221 Personal history of antineoplastic chemotherapy: Secondary | ICD-10-CM | POA: Insufficient documentation

## 2022-02-12 ENCOUNTER — Other Ambulatory Visit: Payer: Self-pay | Admitting: Radiation Therapy

## 2022-02-12 ENCOUNTER — Telehealth: Payer: Self-pay | Admitting: Internal Medicine

## 2022-02-12 NOTE — Telephone Encounter (Signed)
.  Called patient to schedule appointment per 4/4 inbasket, patient is aware of date and time.   ?

## 2022-02-27 ENCOUNTER — Ambulatory Visit (HOSPITAL_COMMUNITY)
Admission: RE | Admit: 2022-02-27 | Discharge: 2022-02-27 | Disposition: A | Discharge status: Home / Self Care | Payer: Medicaid Other | Source: Ambulatory Visit | Attending: Internal Medicine | Admitting: Internal Medicine

## 2022-02-27 DIAGNOSIS — C719 Malignant neoplasm of brain, unspecified: Secondary | ICD-10-CM | POA: Insufficient documentation

## 2022-02-27 DIAGNOSIS — D496 Neoplasm of unspecified behavior of brain: Secondary | ICD-10-CM | POA: Diagnosis not present

## 2022-02-27 DIAGNOSIS — Z9889 Other specified postprocedural states: Secondary | ICD-10-CM | POA: Diagnosis not present

## 2022-02-27 IMAGING — MR MR HEAD WO/W CM
15 of 17 series · 31 of 48 positions shown · IV contrast (gadavist)
Comparison: MRI of the brain [DATE].

CLINICAL DATA: Oligodendroglioma, IDH gene mutant and
1p/[K3] ([facility]) [K3] ([K3]-CM)

EXAM:
MRI HEAD WITHOUT AND WITH CONTRAST
TECHNIQUE: Multiplanar, multiecho pulse sequences of the brain and surrounding
structures were obtained without and with intravenous contrast.
CONTRAST:  7mL GADAVIST GADOBUTROL 1 MMOL/ML IV SOLN

[Series 5: DWI · axial · 3.0mm · 0.77mm/px · z∈[-71,+68]mm · 2 of 48 slices shown (1 of 6)]
[im 1/48]
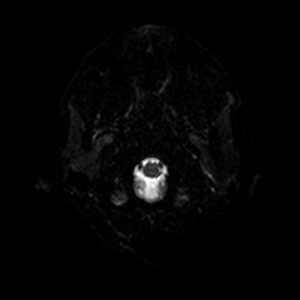
[im 48/48]
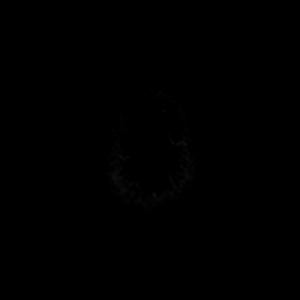

[Series 5: DWI · axial · 3.0mm · 0.77mm/px · z∈[-71,+68]mm · 2 of 48 slices shown (2 of 6)]
[im 1/48]
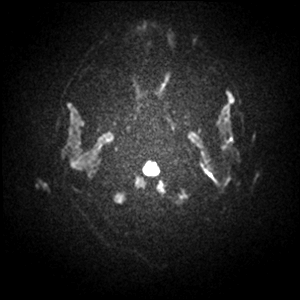
[im 48/48]
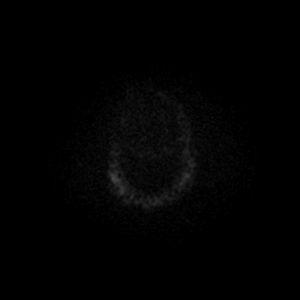

[Series 6: DWI · axial · 3.0mm · 0.77mm/px · z∈[-71,+68]mm · 2 of 48 slices shown (3 of 6)]
[im 1/48]
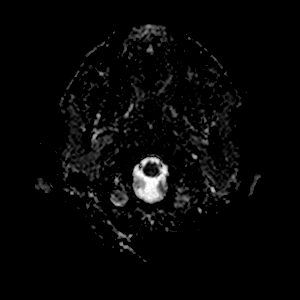
[im 48/48]
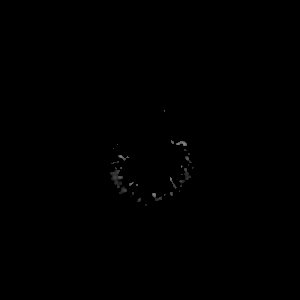

[Series 7: DWI · coronal · 5.0mm · 0.88mm/px · 2 of 28 slices shown (4 of 6)]
[im 1/28]
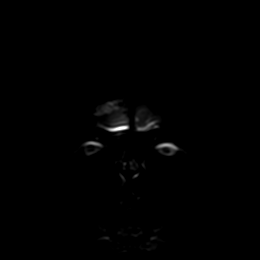
[im 28/28]
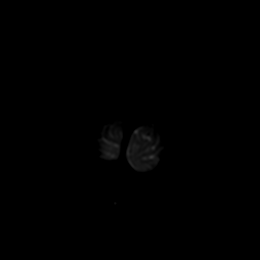

[Series 7: DWI · coronal · 5.0mm · 0.88mm/px · 2 of 28 slices shown (5 of 6)]
[im 1/28]
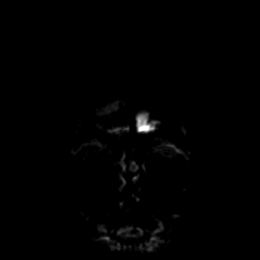
[im 28/28]
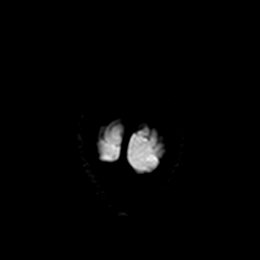

[Series 8: DWI · coronal · 5.0mm · 0.88mm/px · 2 of 28 slices shown (6 of 6)]
[im 1/28]
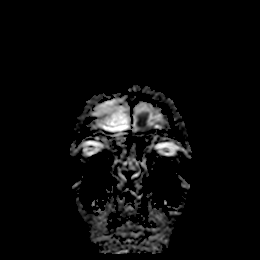
[im 28/28]
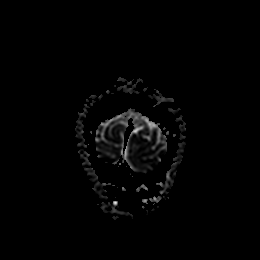

[Series 9: T1 · sagittal · 5.0mm · 0.72mm/px · 1 of 22 slices shown]
[im 1/22]
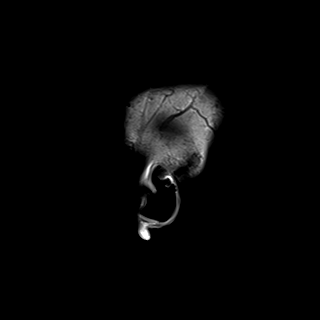

[Series 10: T2 · axial · 5.0mm · 0.72mm/px · 1 of 24 slices shown]
[im 1/24]
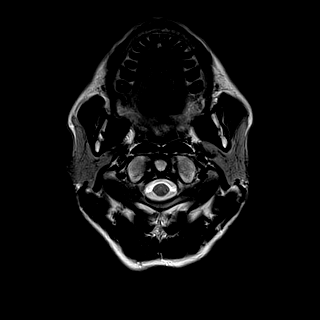

[Series 11: mag_images · axial · 3.0mm · 0.90mm/px · z∈[-89,+85]mm · 3 of 60 slices shown]
[im 1/60]
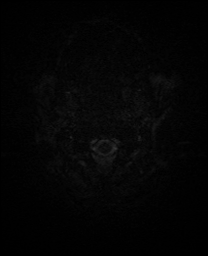
[im 30/60]
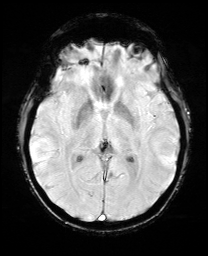
[im 60/60]
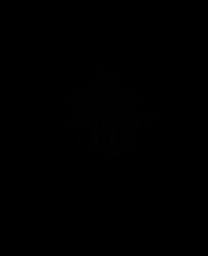

[Series 12: pha_images · axial · 3.0mm · 0.90mm/px · z∈[-89,+77]mm · 3 of 57 slices shown]
[im 1/57]
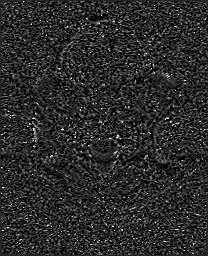
[im 29/57]
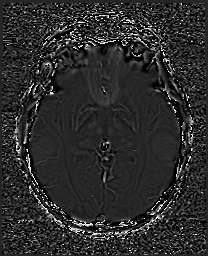
[im 57/57]
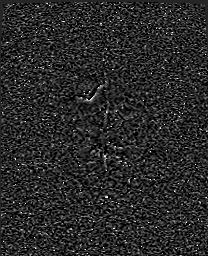

[Series 13: swi_images · axial · 3.0mm · 0.90mm/px · z∈[-89,+85]mm · 3 of 60 slices shown]
[im 1/60]
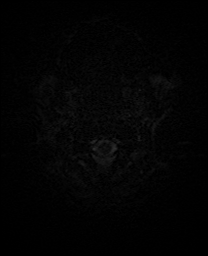
[im 30/60]
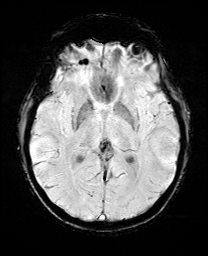
[im 60/60]
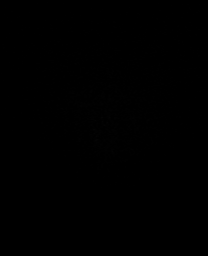

[Series 15: FLAIR · axial · 3.0mm · 0.45mm/px · z∈[-72,+66]mm · 3 of 48 slices shown]
[im 1/48]
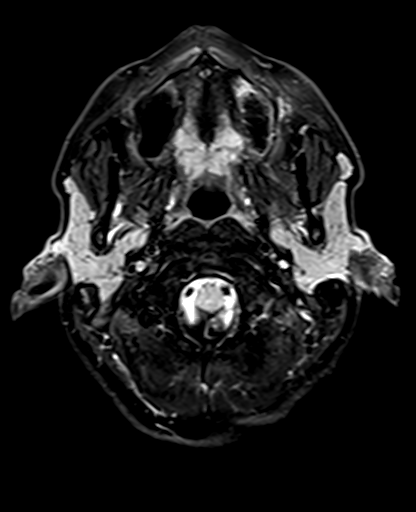
[im 24/48]
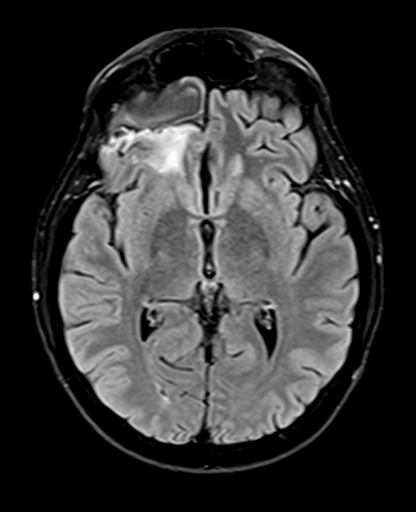
[im 48/48]
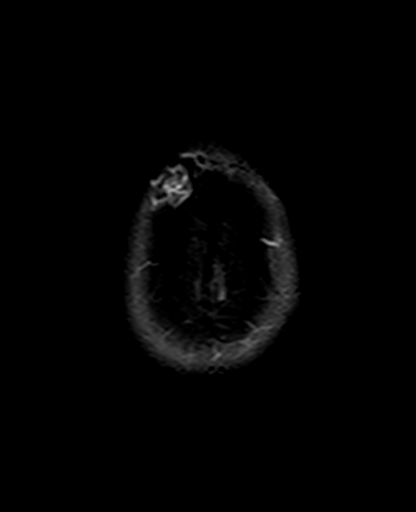

[Series 17: T2 post-contrast · coronal · 5.0mm · 0.72mm/px · 2 of 28 slices shown]
[im 1/28]
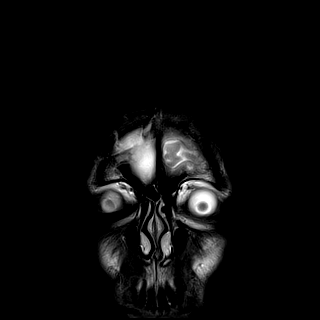
[im 28/28]
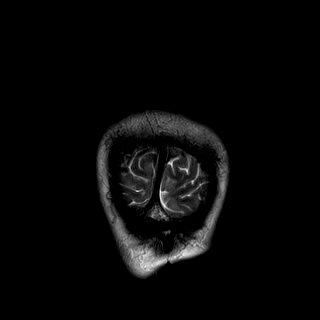

[Series 19: T1 post-contrast · coronal · 5.0mm · 0.34mm/px · 2 of 28 slices shown (1 of 2)]
[im 1/28]
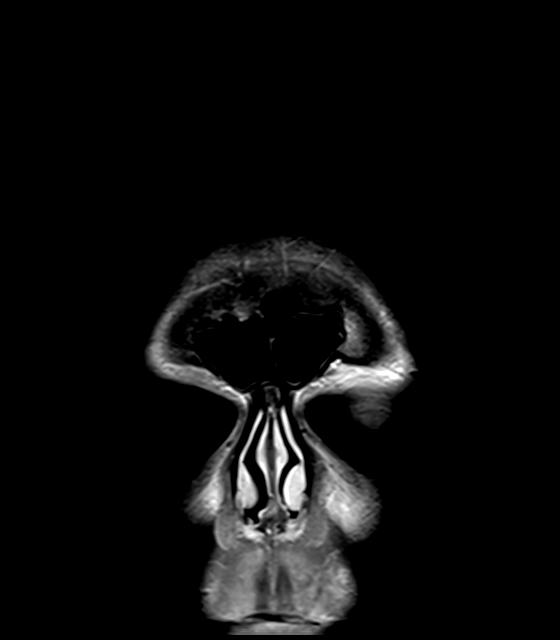
[im 28/28]
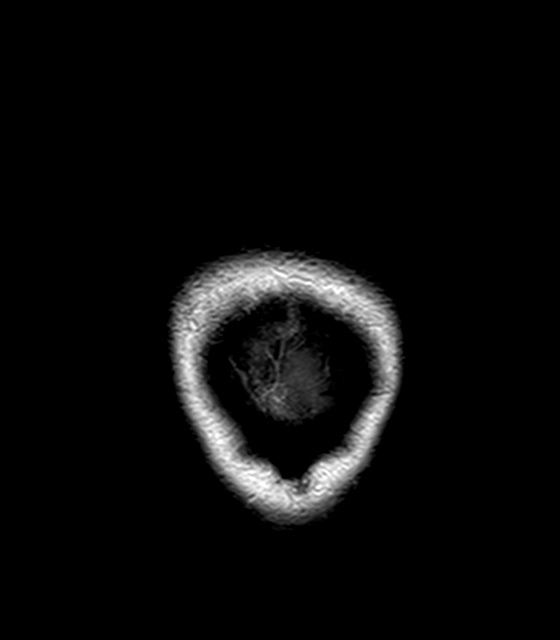

[Series 20: T1 post-contrast · sagittal · 5.0mm · 0.72mm/px · 1 of 22 slices shown (2 of 2)]
[im 1/22]
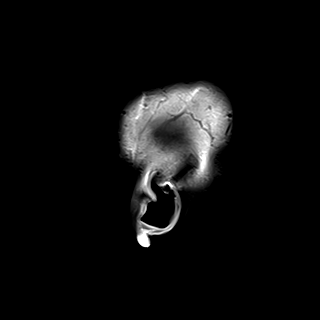

[31 of 48 positions shown; findings below may reference images not displayed]

FINDINGS: Brain: Postsurgical changes from right frontal lobe tumor resection.
Extra-axial fluid collection with associated thin dural enhancement
is unchanged. Stable appearance of the T2 hyperintensity along the
resection margins extending into the right frontal periventricular
white matter and genu of the corpus callosum. No new focus of
abnormal contrast enhancement identified. Retraction of the frontal
horn of the right lateral ventricle is again noted. There is no
hydrocephalus. No acute infarct, acute hemorrhage or midline shift.

Vascular: Normal flow voids.

Skull and upper cervical spine: Postsurgical changes from right
craniotomy. No focal marrow lesion identified.

Sinuses/Orbits: Negative.

Other: None.
IMPRESSION: Postsurgical changes in the right frontal lobe with stable
appearance when compared to prior MRI. No new focus of abnormal
contrast enhancement identified.

## 2022-02-27 MED ORDER — GADOBUTROL 1 MMOL/ML IV SOLN
7.0000 mL | Freq: Once | INTRAVENOUS | Status: AC | PRN
  Administered 2022-02-27: 7 mL via INTRAVENOUS

## 2022-03-04 ENCOUNTER — Telehealth: Payer: Self-pay | Admitting: Internal Medicine

## 2022-03-04 ENCOUNTER — Inpatient Hospital Stay: Payer: Medicaid Other

## 2022-03-04 ENCOUNTER — Other Ambulatory Visit: Payer: Self-pay

## 2022-03-04 ENCOUNTER — Inpatient Hospital Stay: Payer: Medicaid Other | Admitting: Internal Medicine

## 2022-03-04 VITALS — BP 104/60 | HR 50 | Temp 97.6°F | Resp 18 | Wt 142.7 lb

## 2022-03-04 DIAGNOSIS — F17210 Nicotine dependence, cigarettes, uncomplicated: Secondary | ICD-10-CM | POA: Diagnosis not present

## 2022-03-04 DIAGNOSIS — C711 Malignant neoplasm of frontal lobe: Secondary | ICD-10-CM | POA: Diagnosis not present

## 2022-03-04 DIAGNOSIS — Z9221 Personal history of antineoplastic chemotherapy: Secondary | ICD-10-CM | POA: Diagnosis not present

## 2022-03-04 DIAGNOSIS — C719 Malignant neoplasm of brain, unspecified: Secondary | ICD-10-CM | POA: Diagnosis not present

## 2022-03-04 DIAGNOSIS — Z79899 Other long term (current) drug therapy: Secondary | ICD-10-CM | POA: Diagnosis not present

## 2022-03-04 DIAGNOSIS — Z923 Personal history of irradiation: Secondary | ICD-10-CM | POA: Diagnosis not present

## 2022-03-04 NOTE — Progress Notes (Signed)
? ?[facility] at [address]  ?[place], [place] [address] ?[phone] ? ? ?Interval Evaluation ? ?Date of Service: 03/04/22 ?Patient Name: Troy Leon ?Patient MRN: [id number] ?Patient DOB: 07-Sep-1971 ?Provider: Vent[facility], MD ? ?Identifying Statement:  ?Troy Leon is a 51 y.o. male with right frontal  oligodendroglioma grade III   ? ? ?Oncology History  ?Oligodendroglioma, IDH gene mutant and 1p/19q-codeleted ([place])  ?01/09/2021 Surgery  ? Craniotomy, right frontal resection by Dr. Reatha Armour ?  ?02/19/2021 - 03/30/2021 Radiation Therapy  ? IMRT and concurrent Temodar 72m/m2 ?  ?04/24/2021 -  Chemotherapy  ? 5-day Temozolomide ? ?  ? ?  ? ? ?Biomarkers: ? ?MGMT Unknown.  ?IDH 1/2 Mutated.  ?EGFR Unknown  ?1p/19q co-deleted  ? ?Interval History: ? Troy MERASpresents today for follow up, now having completed recent MRI brain.  No new or progressive changes.  Denies seizures or headaches.  Continues with Keppra. ? ?H+P (01/26/21) Patient presented to medical attention in February 2022 with several weeks history of new onset headaches.  He describes holo-cranial pain, severe in nature, often associated with AM time period.  In addition, he describes multiple episodes of "sudden dropping, legs getting out, feeling like I'm losing contact", followed by recovery associated with a period of fatigue.  Notably, these episodes stopped following surgery/resection on 01/09/21 (Troy Leon) and initiation of anti-seizure medication.  Currently he feels well, aside from frequent burping and spasming of "esophagus".  Taking 4103mof decadron three times per day since discharge.  No functional deficits otherwise.  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 ? ?Medications: ?Current Outpatient Medications on File Prior to Visit  ?Medication Sig Dispense Refill  ? lamoTRIgine (LAMICTAL) 100 MG tablet Take 1 tablet (100 mg total) by mouth daily. 60 tablet 2  ? ?No current  facility-administered medications on file prior to visit.  ? ? ?Allergies:  ?Allergies  ?Allergen Reactions  ? Penicillins Other (See Comments)  ?  Has patient had a PCN reaction causing immediate rash, facial/tongue/throat swelling, SOB or lightheadedness with hypotension: unknown ?Has patient had a PCN reaction causing severe rash involving mucus membranes or skin necrosis: unknown ?Has patient had a PCN reaction that required hospitalization unknown ?Has patient had a PCN reaction occurring within the last 10 years:no ?If all of the above answers are "NO", then may proceed wit  ? ?Past Medical History:  ?Past Medical History:  ?Diagnosis Date  ? [place]  ? Pancreatitis   ? ?Past Surgical History:  ?Past Surgical History:  ?Procedure Laterality Date  ? APPLICATION OF CRANIAL NAVIGATION N/A 01/09/2021  ? Procedure: APPLICATION OF CRANIAL NAVIGATION;  Surgeon: Troy Leon, TrTheodoro DoingDO;  [facility] Service: Neurosurgery;  Laterality: N/A;  ? CHOLECYSTECTOMY    ? CRANIOTOMY N/A 01/09/2021  ? Procedure: CRANIOTOMY FOR TUMOR EXCISION;  Surgeon: DaKarsten RoDO;  [facility] Service: Neurosurgery;  Laterality: N/A;  ? ?Social History:  ?Social History  ? ?Socioeconomic History  ? Marital status: Married  ?  Spouse name: Not on file  ? Number of children: Not on file  ? Years of education: Not on file  ? Highest education level: Not on file  ?Occupational History  ? Not on file  ?Tobacco Use  ? Smoking status: Every Day  ?  Packs/day: 0.50  ?  Types: Cigarettes  ? Smokeless tobacco: Never  ?Vaping Use  ?  Vaping Use: Never used  ?Substance and Sexual Activity  ? Alcohol use: No  ? Drug use: Yes  ?  Frequency: 2.0 times per week  ?  Types: Marijuana  ? Sexual activity: Not on file  ?Other Topics Concern  ? Not on file  ?Social History Narrative  ? Not on file  ? ?Social Determinants of Health  ? ?Financial Resource Strain: Not on file  ?Food Insecurity: Not on file   ?Transportation Needs: Not on file  ?Physical Activity: Not on file  ?Stress: Not on file  ?Social Connections: Not on file  ?Intimate Partner Violence: Not on file  ? ?Family History:  ?Family History  ?Problem Relation Age of Onset  ? Diabetes Mother   ? Diabetes Other   ? Heart attack Other   ? Hypertension Other   ? ? ?Review of Systems: ?Constitutional: Doesn't report fevers, chills or abnormal weight loss ?Eyes: Doesn't report blurriness of vision ?Ears, nose, mouth, throat, and face: Doesn't report sore throat ?Respiratory: Doesn't report cough, dyspnea or wheezes ?Cardiovascular: Doesn't report palpitation, chest discomfort  ?Gastrointestinal:  Doesn't report nausea, constipation, diarrhea ?GU: Doesn't report incontinence ?Skin: Doesn't report skin rashes ?Neurological: Per HPI ?Musculoskeletal: Doesn't report joint pain ?Behavioral/Psych: Doesn't report anxiety ? ?Physical Exam: ?Vitals:  ? 03/04/22 1019  ?BP: 104/60  ?Pulse: (!) 50  ?Resp: 18  ?Temp: 97.6 ?F (36.4 ?C)  ?SpO2: 100%  ? ? ?KPS: 90. ?General: Alert, cooperative, pleasant, in no acute distress ?Head: Craniotomy scar w/ staples ?EENT: No conjunctival injection or scleral icterus.  ?Lungs: Resp effort normal ?Cardiac: Regular rate ?Abdomen: Non-distended abdomen ?Skin: No rashes cyanosis or petechiae. ?Extremities: No clubbing or edema ? ?Neurologic Exam: ?Mental Status: Awake, alert, attentive to examiner. Oriented to self and environment. Language is fluent with intact comprehension.  ?Cranial Nerves: Visual acuity is grossly normal. Visual fields are full. Extra-ocular movements intact. No ptosis. Face is symmetric ?Motor: Tone and bulk are normal. Power is full in both arms and legs. Reflexes are symmetric, no pathologic reflexes present.  ?Sensory: Intact to light touch ?Gait: Normal. ? ? ?Labs: ?I have reviewed the data as listed ?   ?Component Value Date/Time  ? NA 144 11/13/2021 1230  ? NA 141 01/01/2021 0939  ? K 4.5 11/13/2021 1230  ?  CL 107 11/13/2021 1230  ? CO2 28 11/13/2021 1230  ? GLUCOSE 112 (H) 11/13/2021 1230  ? BUN 19 11/13/2021 1230  ? BUN 12 01/01/2021 0939  ? CREATININE 1.09 11/13/2021 1230  ? CREATININE 0.80 12/16/2016 1603  ? CALCIUM 9.9 11/13/2021 1230  ? PROT 6.8 11/13/2021 1230  ? ALBUMIN 4.4 11/13/2021 1230  ? AST 14 (L) 11/13/2021 1230  ? ALT 13 11/13/2021 1230  ? ALKPHOS 63 11/13/2021 1230  ? BILITOT 0.7 11/13/2021 1230  ? GFRNONAA >60 11/13/2021 1230  ? GFRAA 107 01/01/2021 0939  ? ?Lab Results  ?Component Value Date  ? WBC 6.7 11/13/2021  ? NEUTROABS 4.2 11/13/2021  ? HGB 14.9 11/13/2021  ? HCT 44.5 11/13/2021  ? MCV 98.5 11/13/2021  ? PLT 185 11/13/2021  ? ? ?Imaging: ? ?[place] Clinician Interpretation: I have personally reviewed the CNS images as listed.  My interpretation, in the context of the patient's clinical presentation, is stable disease ? ?MR BRAIN W WO CONTRAST ? ?Result Date: 02/27/2022 ?CLINICAL DATA:  Oligodendroglioma, IDH gene mutant and 1p/19q-codeleted ([place]) C71.9 (ICD-10-CM) EXAM: MRI HEAD WITHOUT AND WITH CONTRAST TECHNIQUE: Multiplanar, multiecho pulse sequences of the brain and surrounding  structures were obtained without and with intravenous contrast. CONTRAST:  70m GADAVIST GADOBUTROL 1 MMOL/ML IV SOLN COMPARISON:  MRI of the brain November 07, 2021. FINDINGS: Brain: Postsurgical changes from right frontal lobe tumor resection. Extra-axial fluid collection with associated thin dural enhancement is unchanged. Stable appearance of the T2 hyperintensity along the resection margins extending into the right frontal periventricular white matter and genu of the corpus callosum. No new focus of abnormal contrast enhancement identified. Retraction of the frontal horn of the right lateral ventricle is again noted. There is no hydrocephalus. No acute infarct, acute hemorrhage or midline shift. Vascular: Normal flow voids. Skull and upper cervical spine: Postsurgical changes from right craniotomy. No focal marrow  lesion identified. Sinuses/Orbits: Negative. Other: None. IMPRESSION: Postsurgical changes in the right frontal lobe with stable appearance when compared to prior MRI. No new focus of abnormal contrast enhancement identi

## 2022-03-04 NOTE — Telephone Encounter (Signed)
Per 4/24 los called and spoke to pt about upcoming appointment.  Pt confirmed appointment  ?

## 2022-03-07 ENCOUNTER — Other Ambulatory Visit: Payer: Self-pay | Admitting: Radiation Therapy

## 2022-04-11 ENCOUNTER — Other Ambulatory Visit (HOSPITAL_BASED_OUTPATIENT_CLINIC_OR_DEPARTMENT_OTHER): Payer: Self-pay

## 2022-06-03 ENCOUNTER — Other Ambulatory Visit: Payer: Self-pay

## 2022-06-04 ENCOUNTER — Other Ambulatory Visit: Payer: Self-pay

## 2022-06-11 ENCOUNTER — Other Ambulatory Visit: Payer: Self-pay

## 2022-06-12 ENCOUNTER — Other Ambulatory Visit: Payer: Self-pay

## 2022-06-30 ENCOUNTER — Ambulatory Visit (HOSPITAL_COMMUNITY)
Admission: RE | Admit: 2022-06-30 | Discharge: 2022-06-30 | Disposition: A | Discharge status: Home / Self Care | Payer: Medicaid Other | Source: Ambulatory Visit | Attending: Internal Medicine | Admitting: Internal Medicine

## 2022-06-30 DIAGNOSIS — C719 Malignant neoplasm of brain, unspecified: Secondary | ICD-10-CM | POA: Diagnosis not present

## 2022-06-30 DIAGNOSIS — D496 Neoplasm of unspecified behavior of brain: Secondary | ICD-10-CM | POA: Diagnosis not present

## 2022-06-30 DIAGNOSIS — G9389 Other specified disorders of brain: Secondary | ICD-10-CM | POA: Diagnosis not present

## 2022-06-30 DIAGNOSIS — Z9889 Other specified postprocedural states: Secondary | ICD-10-CM | POA: Diagnosis not present

## 2022-06-30 MED ORDER — GADOBUTROL 1 MMOL/ML IV SOLN
6.0000 mL | Freq: Once | INTRAVENOUS | Status: AC | PRN
Start: 2022-06-30 — End: 2022-06-30
  Administered 2022-06-30: 6 mL via INTRAVENOUS

## 2022-07-01 ENCOUNTER — Inpatient Hospital Stay: Payer: Medicaid Other

## 2022-07-01 ENCOUNTER — Other Ambulatory Visit: Payer: Self-pay

## 2022-07-01 ENCOUNTER — Inpatient Hospital Stay: Payer: Medicaid Other | Attending: Internal Medicine | Admitting: Internal Medicine

## 2022-07-01 VITALS — BP 104/70 | HR 56 | Temp 98.2°F | Resp 15 | Wt 144.1 lb

## 2022-07-01 DIAGNOSIS — F17210 Nicotine dependence, cigarettes, uncomplicated: Secondary | ICD-10-CM | POA: Insufficient documentation

## 2022-07-01 DIAGNOSIS — C711 Malignant neoplasm of frontal lobe: Secondary | ICD-10-CM | POA: Diagnosis not present

## 2022-07-01 DIAGNOSIS — Z79899 Other long term (current) drug therapy: Secondary | ICD-10-CM | POA: Insufficient documentation

## 2022-07-01 DIAGNOSIS — C719 Malignant neoplasm of brain, unspecified: Secondary | ICD-10-CM | POA: Diagnosis not present

## 2022-07-01 NOTE — Progress Notes (Signed)
[facility] at [place], [place] [address] [phone]   Interval Evaluation  Date of Service: 07/01/22 Patient Name: Troy Leon Patient MRN: [id number] Patient DOB: 1971-01-20 Provider: Ventura Sellers, MD  Identifying Statement:  Troy Leon is a 51 y.o. male with right frontal  oligodendroglioma grade III     Oncology History  Oligodendroglioma, IDH gene mutant and 1p/19q-codeleted ([place])  01/09/2021 Surgery   Craniotomy, right frontal resection by Dr. Reatha Armour   02/19/2021 - 03/30/2021 Radiation Therapy   IMRT and concurrent Temodar 40m/m2   04/24/2021 -  Chemotherapy   5-day Temozolomide         Biomarkers:  MGMT Unknown.  IDH 1/2 Mutated.  EGFR Unknown  1p/19q co-deleted   Interval History:  Troy MICHAUXpresents today for follow up, now having completed recent MRI brain.  New or progressive clinical complaints today.  Denies seizures or headaches.  Continues with Keppra.  H+P (01/26/21) Patient presented to medical attention in February 2022 with several weeks history of new onset headaches.  He describes holo-cranial pain, severe in nature, often associated with AM time period.  In addition, he describes multiple episodes of "sudden dropping, legs getting out, feeling like I'm losing contact", followed by recovery associated with a period of fatigue.  Notably, these episodes stopped following surgery/resection on 01/09/21 ([name]) and initiation of anti-seizure medication.  Currently he feels well, aside from frequent burping and spasming of "esophagus".  Taking 466mof decadron three times per day since discharge.  No functional deficits otherwise.  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  Medications: Current Outpatient Medications on File Prior to Visit  Medication Sig Dispense Refill   lamoTRIgine (LAMICTAL) 100 MG tablet Take 1 tablet (100 mg total) by mouth daily. 60 tablet 2   No  current facility-administered medications on file prior to visit.    Allergies:  Allergies  Allergen Reactions   Penicillins Other (See Comments)    Has patient had a PCN reaction causing immediate rash, facial/tongue/throat swelling, SOB or lightheadedness with hypotension: unknown Has patient had a PCN reaction causing severe rash involving mucus membranes or skin necrosis: unknown Has patient had a PCN reaction that required hospitalization unknown Has patient had a PCN reaction occurring within the last 10 years:no If all of the above answers are "NO", then may proceed wit   Past Medical History:  Past Medical History:  Diagnosis Date   Oligodendroglioma of frontal lobe ([facility]/20[place]   Pancreatitis    Past Surgical History:  Past Surgical History:  Procedure Laterality Date   APPLICATION OF CRANIAL NAVIGATION N/A 01/09/2021   Procedure: APPLICATION OF CRANIAL NAVIGATION;  Surgeon: DaKarsten RoDO;  [facility];  Laterality: N/A;   CHOLECYSTECTOMY     CRANIOTOMY N/A 01/09/2021   Procedure: CRANIOTOMY FOR TUMOR EXCISION;  Surgeon: DaKarsten RoDO;  [facility]: [facility];  Laterality: N/A;   Social History:  Social History   Socioeconomic History   Marital status: Married    Spouse name: Not on file   Number of children: Not on file   Years of education: Not on file   Highest education level: Not on file  Occupational History   Not on file  Tobacco Use   Smoking status: Every Day    Packs/day: 0.50    Types: Cigarettes   Smokeless tobacco: Never  Vaping Use  Vaping Use: Never used  Substance and Sexual Activity   Alcohol use: No   Drug use: Yes    Frequency: 2.0 times per week    Types: Marijuana   Sexual activity: Not on file  Other Topics Concern   Not on file  Social History Narrative   Not on file   Social Determinants of Health   Financial Resource Strain: Not on file  Food Insecurity: Not on file   Transportation Needs: Not on file  Physical Activity: Not on file  Stress: Not on file  Social Connections: Not on file  Intimate Partner Violence: Not on file   Family History:  Family History  Problem Relation Age of Onset   Diabetes Mother    Diabetes Other    Heart attack Other    Hypertension Other     Review of Systems: Constitutional: Doesn't report fevers, chills or abnormal weight loss Eyes: Doesn't report blurriness of vision Ears, nose, mouth, throat, and face: Doesn't report sore throat Respiratory: Doesn't report cough, dyspnea or wheezes Cardiovascular: Doesn't report palpitation, chest discomfort  Gastrointestinal:  Doesn't report nausea, constipation, diarrhea GU: Doesn't report incontinence Skin: Doesn't report skin rashes Neurological: Per HPI Musculoskeletal: Doesn't report joint pain Behavioral/Psych: Doesn't report anxiety  Physical Exam: Vitals:   07/01/22 1024  BP: 104/70  Pulse: (!) 56  Resp: 15  Temp: 98.2 F (36.8 C)  SpO2: 100%   KPS: 90. General: Alert, cooperative, pleasant, in no acute distress Head: Craniotomy scar w/ staples EENT: No conjunctival injection or scleral icterus.  Lungs: Resp effort normal Cardiac: Regular rate Abdomen: Non-distended abdomen Skin: No rashes cyanosis or petechiae. Extremities: No clubbing or edema  Neurologic Exam: Mental Status: Awake, alert, attentive to examiner. Oriented to self and environment. Language is fluent with intact comprehension.  Cranial Nerves: Visual acuity is grossly normal. Visual fields are full. Extra-ocular movements intact. No ptosis. Face is symmetric Motor: Tone and bulk are normal. Power is full in both arms and legs. Reflexes are symmetric, no pathologic reflexes present.  Sensory: Intact to light touch Gait: Normal.   Labs: I have reviewed the data as listed    Component Value Date/Time   NA 144 11/13/2021 1230   NA 141 01/01/2021 0939   K 4.5 11/13/2021 1230   CL  107 11/13/2021 1230   CO2 28 11/13/2021 1230   GLUCOSE 112 (H) 11/13/2021 1230   BUN 19 11/13/2021 1230   BUN 12 01/01/2021 0939   CREATININE 1.09 11/13/2021 1230   CREATININE 0.80 12/16/2016 1603   CALCIUM 9.9 11/13/2021 1230   PROT 6.8 11/13/2021 1230   ALBUMIN 4.4 11/13/2021 1230   AST 14 (L) 11/13/2021 1230   ALT 13 11/13/2021 1230   ALKPHOS 63 11/13/2021 1230   BILITOT 0.7 11/13/2021 1230   GFRNONAA >60 11/13/2021 1230   GFRAA 107 01/01/2021 0939   Lab Results  Component Value Date   WBC 6.7 11/13/2021   NEUTROABS 4.2 11/13/2021   HGB 14.9 11/13/2021   HCT 44.5 11/13/2021   MCV 98.5 11/13/2021   PLT 185 11/13/2021    Imaging:  [facility] Clinician Interpretation: I have personally reviewed the CNS images as listed.  My interpretation, in the context of the patient's clinical presentation, is stable disease  No results found.   Assessment/Plan Oligodendroglioma, IDH gene mutant and 1p/19q-codeleted ([place]) [C71.9]  Troy Leon is clinically and radiographically stable today.  MRI brain demonstrates stable burden of FLAIR signal abnormality.  We recommended continuing serial  MRI monitoring at this time.  He is agreeable with this plan.  For seizures, will con't Lamictal 113m daily as prior.  We ask that Troy Bentreturn to clinic in 4 months with MRI brain for evaluation, or sooner as needed.  All questions were answered. The patient knows to call the clinic with any problems, questions or concerns. No barriers to learning were detected.  I have spent a total of 30 minutes of face-to-face and non-face-to-face time, excluding clinical staff time, preparing to see patient, ordering tests and/or medications, counseling the patient, and independently interpreting results and communicating results to the patient/family/caregiver    ZVentura Sellers MD Medical Director of Neuro-Oncology [facility]08/21/23 10:15 AM

## 2022-07-02 ENCOUNTER — Other Ambulatory Visit: Payer: Self-pay | Admitting: Radiation Therapy

## 2022-07-03 ENCOUNTER — Other Ambulatory Visit: Payer: Self-pay

## 2022-07-03 DIAGNOSIS — C719 Malignant neoplasm of brain, unspecified: Secondary | ICD-10-CM | POA: Diagnosis not present

## 2022-07-06 ENCOUNTER — Other Ambulatory Visit: Payer: Self-pay

## 2022-08-13 ENCOUNTER — Other Ambulatory Visit: Payer: Self-pay | Admitting: Internal Medicine

## 2022-10-18 ENCOUNTER — Ambulatory Visit (HOSPITAL_COMMUNITY): Payer: Medicaid Other

## 2022-10-23 ENCOUNTER — Ambulatory Visit (HOSPITAL_COMMUNITY)
Admission: RE | Admit: 2022-10-23 | Discharge: 2022-10-23 | Disposition: A | Discharge status: Home / Self Care | Payer: Medicaid Other | Source: Ambulatory Visit | Attending: Internal Medicine | Admitting: Internal Medicine

## 2022-10-23 DIAGNOSIS — C719 Malignant neoplasm of brain, unspecified: Secondary | ICD-10-CM | POA: Diagnosis not present

## 2022-10-23 DIAGNOSIS — G9389 Other specified disorders of brain: Secondary | ICD-10-CM | POA: Diagnosis not present

## 2022-10-23 MED ORDER — GADOBUTROL 1 MMOL/ML IV SOLN
6.5000 mL | Freq: Once | INTRAVENOUS | Status: AC | PRN
Start: 2022-10-23 — End: 2022-10-23
  Administered 2022-10-23: 6.5 mL via INTRAVENOUS

## 2022-10-28 ENCOUNTER — Inpatient Hospital Stay: Payer: Medicaid Other | Attending: Internal Medicine | Admitting: Internal Medicine

## 2022-10-28 ENCOUNTER — Telehealth: Payer: Self-pay | Admitting: Internal Medicine

## 2022-10-28 ENCOUNTER — Other Ambulatory Visit: Payer: Self-pay

## 2022-10-28 VITALS — BP 114/69 | HR 58 | Temp 98.0°F | Resp 16 | Ht 67.0 in | Wt 149.5 lb

## 2022-10-28 DIAGNOSIS — C711 Malignant neoplasm of frontal lobe: Secondary | ICD-10-CM | POA: Insufficient documentation

## 2022-10-28 DIAGNOSIS — Z79899 Other long term (current) drug therapy: Secondary | ICD-10-CM | POA: Diagnosis not present

## 2022-10-28 DIAGNOSIS — C719 Malignant neoplasm of brain, unspecified: Secondary | ICD-10-CM

## 2022-10-28 NOTE — Telephone Encounter (Signed)
Called patient to schedule f/u. Patient notified of new appointments.

## 2022-10-28 NOTE — Progress Notes (Signed)
[facility] at [place], [place] [address] [phone]   Interval Evaluation  Date of Service: 10/28/22 Patient Name: Troy Leon Patient MRN: [id number] Patient DOB: 09-Mar-1971 Provider: Ventura Sellers, MD  Identifying Statement:  Troy Leon is a 51 y.o. male with right frontal  oligodendroglioma grade III     Oncology History  Oligodendroglioma, IDH gene mutant and 1p/19q-codeleted ([place])  01/09/2021 Surgery   Craniotomy, right frontal resection by Dr. Reatha Armour   02/19/2021 - 03/30/2021 Radiation Therapy   IMRT and concurrent Temodar 84m/m2   04/24/2021 -  Chemotherapy   5-day Temozolomide         Biomarkers:  MGMT Unknown.  IDH 1/2 Mutated.  EGFR Unknown  1p/19q co-deleted   Interval History:  Troy PLATNERpresents today for follow up, now having completed recent MRI brain.  Denies any clinical changes today.  Denies seizures or headaches.  Continues with Keppra.  H+P (01/26/21) Patient presented to medical attention in February 2022 with several weeks history of new onset headaches.  He describes holo-cranial pain, severe in nature, often associated with AM time period.  In addition, he describes multiple episodes of "sudden dropping, legs getting out, feeling like I'm losing contact", followed by recovery associated with a period of fatigue.  Notably, these episodes stopped following surgery/resection on 01/09/21 ([name]) and initiation of anti-seizure medication.  Currently he feels well, aside from frequent burping and spasming of "esophagus".  Taking 417mof decadron three times per day since discharge.  No functional deficits otherwise.  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  Medications: Current Outpatient Medications on File Prior to Visit  Medication Sig Dispense Refill   lamoTRIgine (LAMICTAL) 100 MG tablet Take 1 tablet (100 mg total) by mouth daily. 60 tablet 2   No current  facility-administered medications on file prior to visit.    Allergies:  Allergies  Allergen Reactions   Penicillins Other (See Comments)    Has patient had a PCN reaction causing immediate rash, facial/tongue/throat swelling, SOB or lightheadedness with hypotension: unknown Has patient had a PCN reaction causing severe rash involving mucus membranes or skin necrosis: unknown Has patient had a PCN reaction that required hospitalization unknown Has patient had a PCN reaction occurring within the last 10 years:no If all of the above answers are "NO", then may proceed wit   Past Medical History:  Past Medical History:  Diagnosis Date   O[place]oma of frontal lobe ([facility]/[place]   Pancreatitis    Past Surgical History:  Past Surgical History:  Procedure Laterality Date   APPLICATION OF CRANIAL NAVIGATION N/A 01/09/2021   Procedure: APPLICATION OF CRANIAL NAVIGATION;  Surgeon: DaKarsten RoDO;  [facility]: [facility];  Laterality: N/A;   CHOLECYSTECTOMY     CRANIOTOMY N/A 01/09/2021   Procedure: CRANIOTOMY FOR TUMOR EXCISION;  Surgeon: DaKarsten RoDO;  [facility];  Laterality: N/A;   Social History:  Social History   Socioeconomic History   Marital status: Married    Spouse name: Not on file   Number of children: Not on file   Years of education: Not on file   Highest education level: Not on file  Occupational History   Not on file  Tobacco Use   Smoking status: Every Day    Packs/day: 0.50    Types: Cigarettes   Smokeless tobacco: Never  Vaping Use  Vaping Use: Never used  Substance and Sexual Activity   Alcohol use: No   Drug use: Yes    Frequency: 2.0 times per week    Types: Marijuana   Sexual activity: Not on file  Other Topics Concern   Not on file  Social History Narrative   Not on file   Social Determinants of Health   Financial Resource Strain: Not on file  Food Insecurity: Not on file   Transportation Needs: Not on file  Physical Activity: Not on file  Stress: Not on file  Social Connections: Not on file  Intimate Partner Violence: Not on file   Family History:  Family History  Problem Relation Age of Onset   Diabetes Mother    Diabetes Other    Heart attack Other    Hypertension Other     Review of Systems: Constitutional: Doesn't report fevers, chills or abnormal weight loss Eyes: Doesn't report blurriness of vision Ears, nose, mouth, throat, and face: Doesn't report sore throat Respiratory: Doesn't report cough, dyspnea or wheezes Cardiovascular: Doesn't report palpitation, chest discomfort  Gastrointestinal:  Doesn't report nausea, constipation, diarrhea GU: Doesn't report incontinence Skin: Doesn't report skin rashes Neurological: Per HPI Musculoskeletal: Doesn't report joint pain Behavioral/Psych: Doesn't report anxiety  Physical Exam: There were no vitals filed for this visit.  KPS: 90. General: Alert, cooperative, pleasant, in no acute distress Head: Craniotomy scar w/ staples EENT: No conjunctival injection or scleral icterus.  Lungs: Resp effort normal Cardiac: Regular rate Abdomen: Non-distended abdomen Skin: No rashes cyanosis or petechiae. Extremities: No clubbing or edema  Neurologic Exam: Mental Status: Awake, alert, attentive to examiner. Oriented to self and environment. Language is fluent with intact comprehension.  Cranial Nerves: Visual acuity is grossly normal. Visual fields are full. Extra-ocular movements intact. No ptosis. Face is symmetric Motor: Tone and bulk are normal. Power is full in both arms and legs. Reflexes are symmetric, no pathologic reflexes present.  Sensory: Intact to light touch Gait: Normal.   Labs: I have reviewed the data as listed    Component Value Date/Time   NA 144 11/13/2021 1230   NA 141 01/01/2021 0939   K 4.5 11/13/2021 1230   CL 107 11/13/2021 1230   CO2 28 11/13/2021 1230   GLUCOSE 112  (H) 11/13/2021 1230   BUN 19 11/13/2021 1230   BUN 12 01/01/2021 0939   CREATININE 1.09 11/13/2021 1230   CREATININE 0.80 12/16/2016 1603   CALCIUM 9.9 11/13/2021 1230   PROT 6.8 11/13/2021 1230   ALBUMIN 4.4 11/13/2021 1230   AST 14 (L) 11/13/2021 1230   ALT 13 11/13/2021 1230   ALKPHOS 63 11/13/2021 1230   BILITOT 0.7 11/13/2021 1230   GFRNONAA >60 11/13/2021 1230   GFRAA 107 01/01/2021 0939   Lab Results  Component Value Date   WBC 6.7 11/13/2021   NEUTROABS 4.2 11/13/2021   HGB 14.9 11/13/2021   HCT 44.5 11/13/2021   MCV 98.5 11/13/2021   PLT 185 11/13/2021    Imaging:  [facility] Clinician Interpretation: I have personally reviewed the CNS images as listed.  My interpretation, in the context of the patient's clinical presentation, is stable disease  MR BRAIN W WO CONTRAST  Result Date: 10/23/2022 CLINICAL DATA:  Brain/CNS neoplasm. Assess treatment response. Oligodendroglioma. EXAM: MRI HEAD WITHOUT AND WITH CONTRAST TECHNIQUE: Multiplanar, multiecho pulse sequences of the brain and surrounding structures were obtained without and with intravenous contrast. CONTRAST:  6.28m GADAVIST GADOBUTROL 1 MMOL/ML IV SOLN COMPARISON:  06/30/2022 and  multiple previous. FINDINGS: Brain: Normal diffusion imaging. No abnormality affects the brainstem or cerebellum. Previous frontal lobe resection on the right with postsurgical volume loss. Chronic fluid collection at the post resection space is stable. Gliosis and cystic change within the adjacent brain is noted, with slight enlargement of 1 of the adjacent cysts at the periphery, measuring 2.2 x 1.2 cm today compared with 1.8 x 0.5 cm previously, probably due to slow progressive regional volume loss. No evidence of parenchymal enhancement. Ex vacuo enlargement of the right lateral ventricle. Chronic T2 and FLAIR signal within the genu of the corpus callosum as seen previously. Vascular: Major vessels at the base of the brain show flow. Skull and  upper cervical spine: Negative Sinuses/Orbits: Clear/normal Other: None IMPRESSION: 1. No evidence of tumor progression. Slight enlargement of 1 of the adjacent cysts at the periphery of the right frontal lobe, probably due to slow progressive regional volume loss. No evidence of progressive disease. 2. Chronic T2 and FLAIR signal within the genu of the corpus callosum as seen previously. 3. Ex vacuo enlargement of the right lateral ventricle, stable. Electronically Signed   By: Nelson Chimes M.D.   On: 10/23/2022 14:39     Assessment/Plan Oligodendroglioma, IDH gene mutant and 1p/19q-codeleted ([place]) [C71.9]  Troy Leon is clinically and radiographically stable today.  MRI brain demonstrates stable oncologic findings, modest increase in right frontal cyst volume.  We recommended continuing serial MRI monitoring at this time.  He is agreeable with this plan.  For seizures, will con't Lamictal 173m daily as prior.  We ask that RCyndia Bentreturn to clinic in 6 months with MRI brain for evaluation, or sooner as needed.  All questions were answered. The patient knows to call the clinic with any problems, questions or concerns. No barriers to learning were detected.  I have spent a total of 30 minutes of face-to-face and non-face-to-face time, excluding clinical staff time, preparing to see patient, ordering tests and/or medications, counseling the patient, and independently interpreting results and communicating results to the patient/family/caregiver    ZVentura Sellers MD Medical Director of Neuro-Oncology [facility]12/18/23 10:49 AM

## 2022-11-15 ENCOUNTER — Other Ambulatory Visit (HOSPITAL_COMMUNITY): Payer: Self-pay

## 2023-01-14 ENCOUNTER — Other Ambulatory Visit: Payer: Self-pay | Admitting: Internal Medicine

## 2023-03-11 ENCOUNTER — Other Ambulatory Visit: Payer: Self-pay | Admitting: Internal Medicine

## 2023-03-19 ENCOUNTER — Other Ambulatory Visit: Payer: Self-pay | Admitting: Internal Medicine

## 2023-03-19 MED ORDER — LAMOTRIGINE 100 MG PO TABS: 100.0000 mg | ORAL_TABLET | Freq: Every day | ORAL | 4 refills | Status: DC

## 2023-04-24 ENCOUNTER — Ambulatory Visit (HOSPITAL_COMMUNITY)
Admission: RE | Admit: 2023-04-24 | Discharge: 2023-04-24 | Disposition: A | Discharge status: Home / Self Care | Payer: Medicaid Other | Source: Ambulatory Visit | Attending: Internal Medicine | Admitting: Internal Medicine

## 2023-04-24 DIAGNOSIS — C719 Malignant neoplasm of brain, unspecified: Secondary | ICD-10-CM | POA: Insufficient documentation

## 2023-04-24 MED ORDER — GADOBUTROL 1 MMOL/ML IV SOLN
6.5000 mL | Freq: Once | INTRAVENOUS | Status: AC | PRN
  Administered 2023-04-24: 6.5 mL via INTRAVENOUS

## 2023-04-28 ENCOUNTER — Inpatient Hospital Stay: Payer: Medicaid Other | Attending: Internal Medicine | Admitting: Internal Medicine

## 2023-04-28 DIAGNOSIS — C719 Malignant neoplasm of brain, unspecified: Secondary | ICD-10-CM | POA: Diagnosis not present

## 2023-04-28 NOTE — Progress Notes (Signed)
I connected with Troy Leon on 04/28/23 at 11:30 AM EDT by telephone visit and verified that I am speaking with the correct person using two identifiers.  I discussed the limitations, risks, security and privacy concerns of performing an evaluation and management service by telemedicine and the availability of in-person appointments. I also discussed with the patient that there may be a patient responsible charge related to this service. The patient expressed understanding and agreed to proceed.  Other persons participating in the visit and their role in the encounter:  n/a   Patient's location:  Home Provider's location:  Office Chief Complaint:  Oligodendroglioma, IDH gene mutant and 1p/19q-codeleted ([facility])  History of Present Ilness: TILDEN GAI reports no clinical changes today.  Denies seizures.  Does have some sporadic tension type headaches.  Maintains full independence.  Observations: Language and cognition at baseline  Imaging:  [facility] Clinician Interpretation: I have personally reviewed the CNS images as listed.  My interpretation, in the context of the patient's clinical presentation, is stable disease pending official read  No results found.  Assessment and Plan: Oligodendroglioma, IDH gene mutant and 1p/19q-codeleted ([facility])  Troy Leon is clinically stable today.  MRI demonstrates stable findings when compared to prior studies.  Official read is pending.  Follow Up Instructions: We ask that Troy Leon return to clinic in 6 months following next brain MRI, or sooner as needed.  I discussed the assessment and treatment plan with the patient.  The patient was provided an opportunity to ask questions and all were answered.  The patient agreed with the plan and demonstrated understanding of the instructions.    The patient was advised to call back or seek an in-person evaluation if the symptoms worsen or if the condition fails to improve as anticipated.    Henreitta Leber, MD   I provided 23 minutes of non face-to-face telephone visit time during this encounter, and > 50% was spent counseling as documented under my assessment & plan.

## 2023-06-13 ENCOUNTER — Other Ambulatory Visit: Payer: Self-pay

## 2023-10-14 ENCOUNTER — Telehealth: Payer: Self-pay | Admitting: *Deleted

## 2023-10-14 NOTE — Telephone Encounter (Signed)
PC to patient regarding unscheduled brain MRI, instructed patient to call Central Scheduling to schedule, [phone].  Informed patient he has appointment with Dr Barbaraann Cao on 10/21/23 which may need to be rescheduled.  Patient verbalizes understanding, will call to schedule MRI.

## 2023-10-16 ENCOUNTER — Ambulatory Visit (HOSPITAL_COMMUNITY)
Admission: RE | Admit: 2023-10-16 | Discharge: 2023-10-16 | Disposition: A | Discharge status: Home / Self Care | Payer: Medicare Other | Source: Ambulatory Visit | Attending: Internal Medicine | Admitting: Internal Medicine

## 2023-10-16 DIAGNOSIS — C719 Malignant neoplasm of brain, unspecified: Secondary | ICD-10-CM | POA: Insufficient documentation

## 2023-10-16 MED ORDER — GADOBUTROL 1 MMOL/ML IV SOLN
7.0000 mL | Freq: Once | INTRAVENOUS | Status: AC | PRN
Start: 2023-10-16 — End: 2023-10-16
  Administered 2023-10-16: 7 mL via INTRAVENOUS

## 2023-10-21 ENCOUNTER — Inpatient Hospital Stay: Payer: Medicare Other | Attending: Internal Medicine | Admitting: Internal Medicine

## 2023-10-21 VITALS — BP 103/70 | HR 52 | Temp 97.2°F | Resp 20 | Wt 148.1 lb

## 2023-10-21 DIAGNOSIS — Z79899 Other long term (current) drug therapy: Secondary | ICD-10-CM | POA: Diagnosis not present

## 2023-10-21 DIAGNOSIS — C719 Malignant neoplasm of brain, unspecified: Secondary | ICD-10-CM

## 2023-10-21 DIAGNOSIS — C711 Malignant neoplasm of frontal lobe: Secondary | ICD-10-CM | POA: Diagnosis present

## 2023-10-21 DIAGNOSIS — R569 Unspecified convulsions: Secondary | ICD-10-CM | POA: Diagnosis not present

## 2023-10-21 NOTE — Progress Notes (Signed)
[facility] [address]  [place], [place] [address] [phone]   Interval Evaluation  Date of Service: 10/21/23 Patient Name: Troy Leon Patient MRN: [id number] Patient DOB: [birth year] Provider: Henreitta Leber, MD  Identifying Statement:  Troy Leon is a 52 y.o. male with right frontal  oligodendroglioma grade III     Oncology History  Oligodendroglioma, IDH gene mutant and 1p/19q-codeleted (HCC)  01/09/2021 Surgery   Craniotomy, right frontal resection by Dr. Jake Samples   02/19/2021 - 03/30/2021 Radiation Therapy   IMRT and concurrent Temodar 75mg /m2   04/24/2021 -  Chemotherapy   5-day Temozolomide         Biomarkers:  MGMT Unknown.  IDH 1/2 Mutated.  EGFR Unknown  1p/19q co-deleted   Interval History:  Troy Leon presents today for follow up, now having completed recent MRI brain.  No new or progressive neurologic changes.  Denies seizures or headaches.  Continues with Lamictal.  H+P (01/26/21) Patient presented to medical attention in February 2022 with several weeks history of new onset headaches.  He describes holo-cranial pain, severe in nature, often associated with AM time period.  In addition, he describes multiple episodes of "sudden dropping, legs getting out, feeling like I'm losing contact", followed by recovery associated with a period of fatigue.  Notably, these episodes stopped following surgery/resection on 01/09/21 ([name]) and initiation of anti-seizure medication.  Currently he feels well, aside from frequent burping and spasming of "esophagus".  Taking 4mg  of decadron three times per day since discharge.  No functional deficits otherwise.  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  Medications: Current Outpatient Medications on File Prior to Visit  Medication Sig Dispense Refill   lamoTRIgine (LAMICTAL) 100 MG tablet Take 1 tablet (100 mg total) by mouth daily. 60 tablet 4   No  current facility-administered medications on file prior to visit.    Allergies:  Allergies  Allergen Reactions   Penicillins Other (See Comments)    Has patient had a PCN reaction causing immediate rash, facial/tongue/throat swelling, SOB or lightheadedness with hypotension: unknown Has patient had a PCN reaction causing severe rash involving mucus membranes or skin necrosis: unknown Has patient had a PCN reaction that required hospitalization unknown Has patient had a PCN reaction occurring within the last 10 years:no If all of the above answers are "NO", then may proceed wit   Past Medical History:  Past Medical History:  Diagnosis Date   Oligodendroglioma of frontal lobe (HCC) 01/2021   Pancreatitis    Past Surgical History:  Past Surgical History:  Procedure Laterality Date   APPLICATION OF CRANIAL NAVIGATION N/A 01/09/2021   Procedure: APPLICATION OF CRANIAL NAVIGATION;  Surgeon: Bethann Goo, DO;  Location: [facility] OR;  Service: Neurosurgery;  Laterality: N/A;   CHOLECYSTECTOMY     CRANIOTOMY N/A 01/09/2021   Procedure: CRANIOTOMY FOR TUMOR EXCISION;  Surgeon: Bethann Goo, DO;  Location: [facility] OR;  Service: Neurosurgery;  Laterality: N/A;   Social History:  Social History   Socioeconomic History   Marital status: Married    Spouse name: Not on file   Number of children: Not on file   Years of education: Not on file   Highest education level: Not on file  Occupational History   Not on file  Tobacco Use   Smoking status: Every Day    Current packs/day: 0.50    Types: Cigarettes   Smokeless tobacco: Never  Vaping  Use   Vaping status: Never Used  Substance and Sexual Activity   Alcohol use: No   Drug use: Yes    Frequency: 2.0 times per week    Types: Marijuana   Sexual activity: Not on file  Other Topics Concern   Not on file  Social History Narrative   Not on file   Social Determinants of Health   Financial Resource Strain: Not on file  Food Insecurity: Not on  file  Transportation Needs: Not on file  Physical Activity: Not on file  Stress: Not on file  Social Connections: Not on file  Intimate Partner Violence: Not on file   Family History:  Family History  Problem Relation Age of Onset   Diabetes Mother    Diabetes Other    Heart attack Other    Hypertension Other     Review of Systems: Constitutional: Doesn't report fevers, chills or abnormal weight loss Eyes: Doesn't report blurriness of vision Ears, nose, mouth, throat, and face: Doesn't report sore throat Respiratory: Doesn't report cough, dyspnea or wheezes Cardiovascular: Doesn't report palpitation, chest discomfort  Gastrointestinal:  Doesn't report nausea, constipation, diarrhea GU: Doesn't report incontinence Skin: Doesn't report skin rashes Neurological: Per HPI Musculoskeletal: Doesn't report joint pain Behavioral/Psych: Doesn't report anxiety  Physical Exam: Vitals:   10/21/23 1135  BP: 103/70  Pulse: (!) 52  Resp: 20  Temp: (!) 97.2 F (36.2 C)  SpO2: 98%    KPS: 90. General: Alert, cooperative, pleasant, in no acute distress Head: Craniotomy scar w/ staples EENT: No conjunctival injection or scleral icterus.  Lungs: Resp effort normal Cardiac: Regular rate Abdomen: Non-distended abdomen Skin: No rashes cyanosis or petechiae. Extremities: No clubbing or edema  Neurologic Exam: Mental Status: Awake, alert, attentive to examiner. Oriented to self and environment. Language is fluent with intact comprehension.  Cranial Nerves: Visual acuity is grossly normal. Visual fields are full. Extra-ocular movements intact. No ptosis. Face is symmetric Motor: Tone and bulk are normal. Power is full in both arms and legs. Reflexes are symmetric, no pathologic reflexes present.  Sensory: Intact to light touch Gait: Normal.   Labs: I have reviewed the data as listed    Component Value Date/Time   NA 144 11/13/2021 1230   NA 141 01/01/2021 0939   K 4.5 11/13/2021  1230   CL 107 11/13/2021 1230   CO2 28 11/13/2021 1230   GLUCOSE 112 (H) 11/13/2021 1230   BUN 19 11/13/2021 1230   BUN 12 01/01/2021 0939   CREATININE 1.09 11/13/2021 1230   CREATININE 0.80 12/16/2016 1603   CALCIUM 9.9 11/13/2021 1230   PROT 6.8 11/13/2021 1230   ALBUMIN 4.4 11/13/2021 1230   AST 14 (L) 11/13/2021 1230   ALT 13 11/13/2021 1230   ALKPHOS 63 11/13/2021 1230   BILITOT 0.7 11/13/2021 1230   GFRNONAA >60 11/13/2021 1230   GFRAA 107 01/01/2021 0939   Lab Results  Component Value Date   WBC 6.7 11/13/2021   NEUTROABS 4.2 11/13/2021   HGB 14.9 11/13/2021   HCT 44.5 11/13/2021   MCV 98.5 11/13/2021   PLT 185 11/13/2021    Imaging:  [facility] Clinician Interpretation: I have personally reviewed the CNS images as listed.  My interpretation, in the context of the patient's clinical presentation, is stable disease  MR BRAIN W WO CONTRAST  Result Date: 10/16/2023 CLINICAL DATA:  Brain/CNS neoplasm, assess treatment response. Oligodendroglioma. EXAM: MRI HEAD WITHOUT AND WITH CONTRAST TECHNIQUE: Multiplanar, multiecho pulse sequences of the brain and  surrounding structures were obtained without and with intravenous contrast. CONTRAST:  7mL GADAVIST GADOBUTROL 1 MMOL/ML IV SOLN COMPARISON:  04/24/2023.  02/27/2022. FINDINGS: Brain: Diffusion imaging does not show any acute or subacute infarction or other cause of restricted diffusion. No abnormality affects the brainstem or cerebellum. Left cerebral hemispheres normal except for some chronic T2 and FLAIR signal associated with the periventricular deep white matter in the frontal region and genu of the corpus callosum. On the right, there is been previous craniotomy with frontal lobe resection. Post resection fluid collection is unchanged in size, shape and signal characteristics. Small cystic area along the anterior margin of the brain parenchyma continues to enlarged slowly, measuring 24 x 15 mm on FLAIR axial image 34. This is only  a mm larger when compared to the study of June. In April of last year, this measured 23 x 10 mm. T2 and FLAIR signal within the brain adjacent to the resection is stable. No regional abnormal enhancement. This enlarging cysts is viewed with mild suspicion, as there was a cystic component on the initial presentation scan 01/04/2021. However, no other findings are progressive in a worrisome fashion. I think the white matter changes of the genu of the corpus callosum and left frontal white matter are probably wallerian, though I did consider possibility of slowly infiltrating neoplasm. Vascular: Major vessels at the base of the brain show flow. Skull and upper cervical spine: Negative Sinuses/Orbits: Clear/normal Other: None IMPRESSION: Previous right frontal lobe resection. Post resection fluid collection is unchanged in size, shape and signal characteristics. Small cystic area along the anterior margin of the brain parenchyma continues to enlarge slowly, measuring 24 x 15 mm on FLAIR. This is only a mm larger when compared to the study of June. In April of last year, this measured 23 x 10 mm. T2 and FLAIR signal within the brain adjacent to the resection is stable or slowly evolving. No regional abnormal enhancement. This enlarging cysts is viewed with mild suspicion, as there was a cystic component on the initial presentation scan 01/04/2021. However, no other findings are progressive in a worrisome fashion and this may simply be due to slow post treatment volume loss in the region. I think the white matter changes of the genu of the corpus callosum and left frontal white matter are probably Wallerian, though I do consider the possibility of slowly infiltrating neoplasm within the realm of possibility. Electronically Signed   By: Paulina Fusi M.D.   On: 10/16/2023 09:48     Assessment/Plan Oligodendroglioma, IDH gene mutant and 1p/19q-codeleted (HCC) [C71.9]  PHIL KOZAR is clinically and radiographically  stable today.  MRI brain demonstrates stable oncologic findings, ongoing very slow increase in right frontal cyst volume.  We recommended continuing serial MRI monitoring at this time.  He is agreeable with this plan.  For seizures, will con't Lamictal 100mg  daily as prior.  We ask that Jennette Bill return to clinic in 6 months with MRI brain for evaluation, or sooner as needed.  All questions were answered. The patient knows to call the clinic with any problems, questions or concerns. No barriers to learning were detected.  I have spent a total of 40 minutes of face-to-face and non-face-to-face time, excluding clinical staff time, preparing to see patient, ordering tests and/or medications, counseling the patient, and independently interpreting results and communicating results to the patient/family/caregiver    Henreitta Leber, MD Medical Director of Neuro-Oncology [facility] [place] [facility] 10/21/23 11:34  AM

## 2024-04-15 ENCOUNTER — Ambulatory Visit (HOSPITAL_COMMUNITY)
Admission: RE | Admit: 2024-04-15 | Discharge: 2024-04-15 | Disposition: A | Discharge status: Home / Self Care | Source: Ambulatory Visit | Attending: Internal Medicine | Admitting: Internal Medicine

## 2024-04-15 DIAGNOSIS — C719 Malignant neoplasm of brain, unspecified: Secondary | ICD-10-CM | POA: Diagnosis present

## 2024-04-15 MED ORDER — GADOBUTROL 1 MMOL/ML IV SOLN
7.0000 mL | Freq: Once | INTRAVENOUS | Status: AC | PRN
Start: 2024-04-15 — End: 2024-04-15
  Administered 2024-04-15: 7 mL via INTRAVENOUS

## 2024-04-20 ENCOUNTER — Other Ambulatory Visit: Payer: Medicare Other

## 2024-04-20 ENCOUNTER — Inpatient Hospital Stay: Payer: Medicare Other | Attending: Internal Medicine | Admitting: Internal Medicine

## 2024-04-20 VITALS — BP 109/66 | HR 58 | Temp 97.9°F | Resp 20 | Wt 148.9 lb

## 2024-04-20 DIAGNOSIS — C719 Malignant neoplasm of brain, unspecified: Secondary | ICD-10-CM | POA: Diagnosis not present

## 2024-04-20 DIAGNOSIS — R569 Unspecified convulsions: Secondary | ICD-10-CM | POA: Diagnosis not present

## 2024-04-20 DIAGNOSIS — Z79899 Other long term (current) drug therapy: Secondary | ICD-10-CM | POA: Insufficient documentation

## 2024-04-20 DIAGNOSIS — C711 Malignant neoplasm of frontal lobe: Secondary | ICD-10-CM | POA: Insufficient documentation

## 2024-04-20 NOTE — Progress Notes (Signed)
 [facility] [address]  [place], [place] [address] [phone]   Interval Evaluation  Date of Service: 04/20/24 Patient Name: Troy Leon Patient MRN: [id number] Patient DOB: 12-31-1970 Provider: Mamie Searles, MD  Identifying Statement:  Troy Leon is a 53 y.o. male with right frontal oligodendroglioma grade III    Oncology History  Oligodendroglioma, IDH gene mutant and 1p/19q-codeleted (HCC)  01/09/2021 Surgery   Craniotomy, right frontal resection by Dr. Julane Ny   02/19/2021 - 03/30/2021 Radiation Therapy   IMRT and concurrent Temodar  75mg /m2   04/24/2021 -  Chemotherapy   5-day Temozolomide          Biomarkers:  MGMT Unknown.  IDH 1/2 Mutated.  EGFR Unknown  1p/19q co-deleted   Interval History:  Troy Leon presents today for follow up, now having completed recent MRI brain.  Doesn't describe any new or progressive changes today.  Denies seizures or headaches.  Continues with Lamictal  100mg  daily.  H+P (01/26/21) Patient presented to medical attention in February 2022 with several weeks history of new onset headaches.  He describes holo-cranial pain, severe in nature, often associated with AM time period.  In addition, he describes multiple episodes of "sudden dropping, legs getting out, feeling like I'm losing contact", followed by recovery associated with a period of fatigue.  Notably, these episodes stopped following surgery/resection on 01/09/21 ([name]) and initiation of anti-seizure medication.  Currently he feels well, aside from frequent burping and spasming of "esophagus".  Taking 4mg  of decadron  three times per day since discharge.  No functional deficits otherwise.  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  Medications: Current Outpatient Medications on File Prior to Visit  Medication Sig Dispense Refill   lamoTRIgine  (LAMICTAL ) 100 MG tablet Take 1 tablet (100 mg total) by mouth  daily. 60 tablet 4   No current facility-administered medications on file prior to visit.    Allergies:  Allergies  Allergen Reactions   Penicillins Other (See Comments)    Has patient had a PCN reaction causing immediate rash, facial/tongue/throat swelling, SOB or lightheadedness with hypotension: unknown Has patient had a PCN reaction causing severe rash involving mucus membranes or skin necrosis: unknown Has patient had a PCN reaction that required hospitalization unknown Has patient had a PCN reaction occurring within the last 10 years:no If all of the above answers are "NO", then may proceed wit   Past Medical History:  Past Medical History:  Diagnosis Date   Oligodendroglioma of frontal lobe (HCC) 01/2021   Pancreatitis    Past Surgical History:  Past Surgical History:  Procedure Laterality Date   APPLICATION OF CRANIAL NAVIGATION N/A 01/09/2021   Procedure: APPLICATION OF CRANIAL NAVIGATION;  Surgeon: Pincus Bridgeman, DO;  Location: [facility] OR;  Service: Neurosurgery;  Laterality: N/A;   CHOLECYSTECTOMY     CRANIOTOMY N/A 01/09/2021   Procedure: CRANIOTOMY FOR TUMOR EXCISION;  Surgeon: Pincus Bridgeman, DO;  Location: [facility] OR;  Service: Neurosurgery;  Laterality: N/A;   Social History:  Social History   Socioeconomic History   Marital status: Married    Spouse name: Not on file   Number of children: Not on file   Years of education: Not on file   Highest education level: Not on file  Occupational History   Not on file  Tobacco Use   Smoking status: Every Day    Current packs/day: 0.50    Types: Cigarettes   Smokeless tobacco: Never  Vaping Use   Vaping status: Never Used  Substance and Sexual Activity   Alcohol use: No   Drug use: Yes    Frequency: 2.0 times per week    Types: Marijuana   Sexual activity: Not on file  Other Topics Concern   Not on file  Social History Narrative   Not on file   Social Drivers of Health   Financial Resource Strain: Not on file   Food Insecurity: Not on file  Transportation Needs: Not on file  Physical Activity: Not on file  Stress: Not on file  Social Connections: Not on file  Intimate Partner Violence: Not on file   Family History:  Family History  Problem Relation Age of Onset   Diabetes Mother    Diabetes Other    Heart attack Other    Hypertension Other     Review of Systems: Constitutional: Doesn't report fevers, chills or abnormal weight loss Eyes: Doesn't report blurriness of vision Ears, nose, mouth, throat, and face: Doesn't report sore throat Respiratory: Doesn't report cough, dyspnea or wheezes Cardiovascular: Doesn't report palpitation, chest discomfort  Gastrointestinal:  Doesn't report nausea, constipation, diarrhea GU: Doesn't report incontinence Skin: Doesn't report skin rashes Neurological: Per HPI Musculoskeletal: Doesn't report joint pain Behavioral/Psych: Doesn't report anxiety  Physical Exam: Vitals:   04/20/24 1249  BP: 109/66  Pulse: (!) 58  Resp: 20  Temp: 97.9 F (36.6 C)  SpO2: 95%    KPS: 90. General: Alert, cooperative, pleasant, in no acute distress Head: Craniotomy scar w/ staples EENT: No conjunctival injection or scleral icterus.  Lungs: Resp effort normal Cardiac: Regular rate Abdomen: Non-distended abdomen Skin: No rashes cyanosis or petechiae. Extremities: No clubbing or edema  Neurologic Exam: Mental Status: Awake, alert, attentive to examiner. Oriented to self and environment. Language is fluent with intact comprehension.  Cranial Nerves: Visual acuity is grossly normal. Visual fields are full. Extra-ocular movements intact. No ptosis. Face is symmetric Motor: Tone and bulk are normal. Power is full in both arms and legs. Reflexes are symmetric, no pathologic reflexes present.  Sensory: Intact to light touch Gait: Normal.   Labs: I have reviewed the data as listed    Component Value Date/Time   NA 144 11/13/2021 1230   NA 141 01/01/2021  0939   K 4.5 11/13/2021 1230   CL 107 11/13/2021 1230   CO2 28 11/13/2021 1230   GLUCOSE 112 (H) 11/13/2021 1230   BUN 19 11/13/2021 1230   BUN 12 01/01/2021 0939   CREATININE 1.09 11/13/2021 1230   CREATININE 0.80 12/16/2016 1603   CALCIUM  9.9 11/13/2021 1230   PROT 6.8 11/13/2021 1230   ALBUMIN  4.4 11/13/2021 1230   AST 14 (L) 11/13/2021 1230   ALT 13 11/13/2021 1230   ALKPHOS 63 11/13/2021 1230   BILITOT 0.7 11/13/2021 1230   GFRNONAA >60 11/13/2021 1230   GFRAA 107 01/01/2021 0939   Lab Results  Component Value Date   WBC 6.7 11/13/2021   NEUTROABS 4.2 11/13/2021   HGB 14.9 11/13/2021   HCT 44.5 11/13/2021   MCV 98.5 11/13/2021   PLT 185 11/13/2021    Imaging:  [facility] Clinician Interpretation: I have personally reviewed the CNS images as listed.  My interpretation, in the context of the patient's clinical presentation, is stable disease  MR BRAIN W WO CONTRAST Result Date: 04/15/2024 CLINICAL DATA:  Brain/CNS neoplasm, assess treatment response. History of oligodendroglioma. EXAM: MRI HEAD WITHOUT AND WITH CONTRAST TECHNIQUE: Multiplanar, multiecho pulse sequences of the brain  and surrounding structures were obtained without and with intravenous contrast. CONTRAST:  7mL GADAVIST  GADOBUTROL  1 MMOL/ML IV SOLN COMPARISON:  MRI head 10/16/2023. FINDINGS: Brain: Redemonstrated right frontal craniotomy and similar appearance of underlying right frontal lobe resection cavity. Post resection fluid collection over the anterior right frontal lobe is similar to prior. Additional cystic focus along the margin of the resection cavity noted which measures 2.2 x 1.5 cm overall similar to prior. Adjacent T2/FLAIR hyperintensity within the right frontal lobe along the resection cavity is similar to prior. No abnormal masslike or nodular enhancement appreciated. No acute infarct. Similar white matter signal abnormality along the genu of the corpus callosum and within the left frontal lobe. No  significant mass effect or midline shift. The basilar cisterns are patent. Posterior fossa is unremarkable. Similar ex vacuo dilatation of the right frontal horn. Ventricles are stable from prior. Vascular: Normal flow voids. Skull and upper cervical spine: Normal marrow signal. Sinuses/Orbits: Negative. Other: None. IMPRESSION: Similar appearance of right frontal lobe resection cavity with adjacent extra-axial fluid collection. Small cystic focus along the margin of the resection cavity is stable since 10/16/2023. No nodular or masslike enhancement. Similar signal abnormality involving the genu of the corpus callosum and left frontal periventricular white matter. Electronically Signed   By: Denny Flack M.D.   On: 04/15/2024 11:41     Assessment/Plan No primary diagnosis found.  Olin Bertin is clinically and radiographically stable today.  MRI brain demonstrates stable findings overall.  No cyst enlargement.  We recommended continuing serial MRI monitoring at this time.  He is agreeable with this plan.  For seizures, will con't Lamictal  100mg  daily as prior.  We ask that Olin Bertin return to clinic in 9 months with MRI brain for evaluation, or sooner as needed.  All questions were answered. The patient knows to call the clinic with any problems, questions or concerns. No barriers to learning were detected.  I have spent a total of 40 minutes of face-to-face and non-face-to-face time, excluding clinical staff time, preparing to see patient, ordering tests and/or medications, counseling the patient, and independently interpreting results and communicating results to the patient/family/caregiver    Elonda Giuliano K Marjarie Irion, MD Medical Director of Neuro-Oncology [facility] [place] [facility] 04/20/24 1:02 PM

## 2024-05-31 ENCOUNTER — Other Ambulatory Visit: Payer: Self-pay | Admitting: Internal Medicine

## 2024-05-31 DIAGNOSIS — C719 Malignant neoplasm of brain, unspecified: Secondary | ICD-10-CM

## 2024-07-24 ENCOUNTER — Other Ambulatory Visit: Payer: Self-pay | Admitting: Internal Medicine

## 2024-07-24 DIAGNOSIS — C719 Malignant neoplasm of brain, unspecified: Secondary | ICD-10-CM

## 2024-11-11 ENCOUNTER — Other Ambulatory Visit: Payer: Self-pay | Admitting: Internal Medicine

## 2024-11-11 DIAGNOSIS — Q9359 Other deletions of part of a chromosome: Secondary | ICD-10-CM

## 2025-01-10 ENCOUNTER — Other Ambulatory Visit (HOSPITAL_COMMUNITY)

## 2025-01-18 ENCOUNTER — Ambulatory Visit: Admitting: Internal Medicine
